# Patient Record
Sex: Female | Born: 1955 | Race: Black or African American | Hispanic: No | Smoking: Former smoker
Health system: Southern US, Community
[De-identification: ages and names within clinical notes are randomized; demographics above are authoritative.]

## PROBLEM LIST (undated history)

## (undated) DIAGNOSIS — I10 Essential (primary) hypertension: Secondary | ICD-10-CM

## (undated) DIAGNOSIS — Z809 Family history of malignant neoplasm, unspecified: Secondary | ICD-10-CM

## (undated) DIAGNOSIS — Z923 Personal history of irradiation: Secondary | ICD-10-CM

## (undated) DIAGNOSIS — Z853 Personal history of malignant neoplasm of breast: Secondary | ICD-10-CM

## (undated) DIAGNOSIS — C50919 Malignant neoplasm of unspecified site of unspecified female breast: Secondary | ICD-10-CM

## (undated) DIAGNOSIS — K219 Gastro-esophageal reflux disease without esophagitis: Secondary | ICD-10-CM

## (undated) HISTORY — DX: Family history of malignant neoplasm, unspecified: Z80.9

## (undated) HISTORY — PX: ABDOMINAL HYSTERECTOMY: SHX81

## (undated) HISTORY — DX: Personal history of malignant neoplasm of breast: Z85.3

## (undated) HISTORY — DX: Essential (primary) hypertension: I10

---

## 1999-04-18 DIAGNOSIS — C50919 Malignant neoplasm of unspecified site of unspecified female breast: Secondary | ICD-10-CM

## 1999-04-18 DIAGNOSIS — Z923 Personal history of irradiation: Secondary | ICD-10-CM

## 1999-04-18 HISTORY — DX: Personal history of irradiation: Z92.3

## 1999-04-18 HISTORY — PX: BREAST BIOPSY: SHX20

## 1999-04-18 HISTORY — PX: BREAST LUMPECTOMY: SHX2

## 1999-04-18 HISTORY — DX: Malignant neoplasm of unspecified site of unspecified female breast: C50.919

## 2004-05-31 ENCOUNTER — Ambulatory Visit: Payer: Self-pay | Admitting: General Surgery

## 2004-12-02 ENCOUNTER — Ambulatory Visit: Payer: Self-pay | Admitting: General Surgery

## 2007-01-16 ENCOUNTER — Ambulatory Visit: Payer: Self-pay | Admitting: General Surgery

## 2008-03-03 ENCOUNTER — Ambulatory Visit: Payer: Self-pay

## 2009-08-10 ENCOUNTER — Ambulatory Visit: Payer: Self-pay

## 2010-09-06 ENCOUNTER — Ambulatory Visit: Payer: Self-pay | Admitting: Family Medicine

## 2010-09-07 ENCOUNTER — Ambulatory Visit: Payer: Self-pay | Admitting: Family Medicine

## 2011-09-07 ENCOUNTER — Ambulatory Visit: Payer: Self-pay | Admitting: Family Medicine

## 2012-09-10 ENCOUNTER — Ambulatory Visit: Payer: Self-pay | Admitting: Family Medicine

## 2013-02-07 ENCOUNTER — Ambulatory Visit: Payer: Self-pay | Admitting: Gastroenterology

## 2013-11-04 ENCOUNTER — Ambulatory Visit: Payer: Self-pay | Admitting: Family Medicine

## 2014-09-30 ENCOUNTER — Other Ambulatory Visit: Payer: Self-pay | Admitting: Family Medicine

## 2014-09-30 DIAGNOSIS — Z1231 Encounter for screening mammogram for malignant neoplasm of breast: Secondary | ICD-10-CM

## 2015-02-02 ENCOUNTER — Ambulatory Visit
Admission: RE | Admit: 2015-02-02 | Discharge: 2015-02-02 | Disposition: A | Discharge status: Home / Self Care | Payer: 59 | Source: Ambulatory Visit | Attending: Family Medicine | Admitting: Family Medicine

## 2015-02-02 DIAGNOSIS — Z1231 Encounter for screening mammogram for malignant neoplasm of breast: Secondary | ICD-10-CM

## 2015-02-02 HISTORY — DX: Malignant neoplasm of unspecified site of unspecified female breast: C50.919

## 2016-01-14 ENCOUNTER — Other Ambulatory Visit: Payer: Self-pay | Admitting: Family Medicine

## 2016-01-14 DIAGNOSIS — Z1231 Encounter for screening mammogram for malignant neoplasm of breast: Secondary | ICD-10-CM

## 2016-02-11 ENCOUNTER — Ambulatory Visit
Admission: RE | Admit: 2016-02-11 | Discharge: 2016-02-11 | Disposition: A | Discharge status: Home / Self Care | Payer: 59 | Source: Ambulatory Visit | Attending: Family Medicine | Admitting: Family Medicine

## 2016-02-11 DIAGNOSIS — Z1231 Encounter for screening mammogram for malignant neoplasm of breast: Secondary | ICD-10-CM | POA: Insufficient documentation

## 2016-02-11 HISTORY — DX: Personal history of irradiation: Z92.3

## 2017-01-16 ENCOUNTER — Other Ambulatory Visit: Payer: Self-pay | Admitting: Physician Assistant

## 2017-01-16 DIAGNOSIS — Z1231 Encounter for screening mammogram for malignant neoplasm of breast: Secondary | ICD-10-CM

## 2017-02-16 ENCOUNTER — Ambulatory Visit
Admission: RE | Admit: 2017-02-16 | Discharge: 2017-02-16 | Disposition: A | Discharge status: Home / Self Care | Payer: 59 | Source: Ambulatory Visit | Attending: Physician Assistant | Admitting: Physician Assistant

## 2017-02-16 DIAGNOSIS — Z1231 Encounter for screening mammogram for malignant neoplasm of breast: Secondary | ICD-10-CM | POA: Diagnosis present

## 2018-02-01 ENCOUNTER — Other Ambulatory Visit: Payer: Self-pay | Admitting: Physician Assistant

## 2018-02-01 DIAGNOSIS — Z1231 Encounter for screening mammogram for malignant neoplasm of breast: Secondary | ICD-10-CM

## 2018-03-01 ENCOUNTER — Ambulatory Visit
Admission: RE | Admit: 2018-03-01 | Discharge: 2018-03-01 | Disposition: A | Discharge status: Home / Self Care | Payer: BLUE CROSS/BLUE SHIELD | Source: Ambulatory Visit | Attending: Physician Assistant | Admitting: Physician Assistant

## 2018-03-01 DIAGNOSIS — Z1231 Encounter for screening mammogram for malignant neoplasm of breast: Secondary | ICD-10-CM | POA: Insufficient documentation

## 2019-02-19 ENCOUNTER — Other Ambulatory Visit: Payer: Self-pay | Admitting: Physician Assistant

## 2019-02-19 DIAGNOSIS — Z1231 Encounter for screening mammogram for malignant neoplasm of breast: Secondary | ICD-10-CM

## 2019-03-07 ENCOUNTER — Ambulatory Visit
Admission: RE | Admit: 2019-03-07 | Discharge: 2019-03-07 | Disposition: A | Discharge status: Home / Self Care | Payer: BC Managed Care – PPO | Source: Ambulatory Visit | Attending: Physician Assistant | Admitting: Physician Assistant

## 2019-03-07 DIAGNOSIS — Z1231 Encounter for screening mammogram for malignant neoplasm of breast: Secondary | ICD-10-CM | POA: Insufficient documentation

## 2019-03-26 ENCOUNTER — Other Ambulatory Visit: Payer: Self-pay

## 2019-03-26 DIAGNOSIS — N6320 Unspecified lump in the left breast, unspecified quadrant: Secondary | ICD-10-CM

## 2019-03-26 DIAGNOSIS — R928 Other abnormal and inconclusive findings on diagnostic imaging of breast: Secondary | ICD-10-CM

## 2019-04-04 ENCOUNTER — Ambulatory Visit
Admission: RE | Admit: 2019-04-04 | Discharge: 2019-04-04 | Disposition: A | Discharge status: Home / Self Care | Payer: BC Managed Care – PPO | Source: Ambulatory Visit | Attending: Family Medicine | Admitting: Family Medicine

## 2019-04-04 DIAGNOSIS — N6320 Unspecified lump in the left breast, unspecified quadrant: Secondary | ICD-10-CM | POA: Diagnosis present

## 2019-04-04 DIAGNOSIS — R928 Other abnormal and inconclusive findings on diagnostic imaging of breast: Secondary | ICD-10-CM | POA: Insufficient documentation

## 2019-04-18 HISTORY — PX: BREAST BIOPSY: SHX20

## 2019-04-28 ENCOUNTER — Other Ambulatory Visit: Payer: Self-pay

## 2019-04-28 DIAGNOSIS — N6320 Unspecified lump in the left breast, unspecified quadrant: Secondary | ICD-10-CM

## 2019-04-28 DIAGNOSIS — R928 Other abnormal and inconclusive findings on diagnostic imaging of breast: Secondary | ICD-10-CM

## 2019-05-05 ENCOUNTER — Ambulatory Visit
Admission: RE | Admit: 2019-05-05 | Discharge: 2019-05-05 | Disposition: A | Discharge status: Home / Self Care | Payer: BC Managed Care – PPO | Source: Ambulatory Visit | Attending: Family Medicine | Admitting: Family Medicine

## 2019-05-05 DIAGNOSIS — N6320 Unspecified lump in the left breast, unspecified quadrant: Secondary | ICD-10-CM | POA: Insufficient documentation

## 2019-05-05 DIAGNOSIS — R928 Other abnormal and inconclusive findings on diagnostic imaging of breast: Secondary | ICD-10-CM | POA: Diagnosis not present

## 2019-05-07 ENCOUNTER — Encounter: Payer: Self-pay | Admitting: *Deleted

## 2019-05-07 DIAGNOSIS — C50912 Malignant neoplasm of unspecified site of left female breast: Secondary | ICD-10-CM

## 2019-05-07 NOTE — Progress Notes (Signed)
Talked to patient today to establish navigation services.  Patient has a history of left breast cancer in 2001.  States she was treated with lumpectomy and radiation therapy only.  I have scheduled her a surgical consult with Dr. Dahlia Byes on Monday 05/12/19 at 10:00 and Dr. Janese Banks at 1:30 to make it more convenient since she lives about an hour away.  Will her educational literature at her appointment with Dr. Janese Banks.

## 2019-05-08 ENCOUNTER — Ambulatory Visit: Payer: Self-pay | Admitting: Surgery

## 2019-05-09 LAB — SURGICAL PATHOLOGY

## 2019-05-12 ENCOUNTER — Other Ambulatory Visit: Payer: Self-pay

## 2019-05-12 ENCOUNTER — Inpatient Hospital Stay: Payer: BC Managed Care – PPO | Attending: Oncology | Admitting: Oncology

## 2019-05-12 ENCOUNTER — Encounter: Payer: Self-pay | Admitting: Oncology

## 2019-05-12 ENCOUNTER — Encounter: Payer: Self-pay | Admitting: *Deleted

## 2019-05-12 ENCOUNTER — Ambulatory Visit: Payer: BC Managed Care – PPO | Admitting: Surgery

## 2019-05-12 ENCOUNTER — Encounter: Payer: Self-pay | Admitting: Surgery

## 2019-05-12 VITALS — BP 153/85 | HR 71 | Temp 97.9°F | Resp 12 | Ht 65.0 in | Wt 183.8 lb

## 2019-05-12 VITALS — BP 161/84 | HR 67 | Temp 97.2°F | Ht 60.0 in | Wt 184.0 lb

## 2019-05-12 DIAGNOSIS — I10 Essential (primary) hypertension: Secondary | ICD-10-CM | POA: Insufficient documentation

## 2019-05-12 DIAGNOSIS — Z7189 Other specified counseling: Secondary | ICD-10-CM | POA: Diagnosis not present

## 2019-05-12 DIAGNOSIS — Z87891 Personal history of nicotine dependence: Secondary | ICD-10-CM | POA: Insufficient documentation

## 2019-05-12 DIAGNOSIS — Z923 Personal history of irradiation: Secondary | ICD-10-CM | POA: Insufficient documentation

## 2019-05-12 DIAGNOSIS — Z171 Estrogen receptor negative status [ER-]: Secondary | ICD-10-CM

## 2019-05-12 DIAGNOSIS — Z853 Personal history of malignant neoplasm of breast: Secondary | ICD-10-CM | POA: Insufficient documentation

## 2019-05-12 DIAGNOSIS — C50312 Malignant neoplasm of lower-inner quadrant of left female breast: Secondary | ICD-10-CM

## 2019-05-12 DIAGNOSIS — Z79899 Other long term (current) drug therapy: Secondary | ICD-10-CM | POA: Insufficient documentation

## 2019-05-12 DIAGNOSIS — Z170 Estrogen receptor positive status [ER+]: Secondary | ICD-10-CM | POA: Diagnosis not present

## 2019-05-12 NOTE — Progress Notes (Signed)
Met patient and her sister today during her initial medical oncology consult.  Patient with history of left breast cancer 2001.  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  Dr. Janese Banks has recommended neoadjuvant chemotherapy with [facility] and Taxol.  She is planning MRI, [place], chemo class and port a cath placement.  Gave patient breast cancer educational literature, "My Breast Cancer Treatment Handbook" by Josephine Igo, RN.  Patient is to call with any questions or needs.

## 2019-05-12 NOTE — Progress Notes (Signed)
Surgical Consultation  05/12/2019  Molly Walters is an 64 y.o. female.   Chief Complaint  Patient presents with  . New Patient (Initial Visit)    invasive mammory left breast      HPI: Molly Walters is a 64 year old female seen after a recent mammogram show an abnormal mass on her left breast.  She consequently underwent ultrasound-guided biopsy.  Please note that I have personally reviewed the ultrasound on the mammographic images showing evidence of 1/2 cm mass 7:00.  Pathology consistent with invasive mammary carcinoma, ER/PR and HER-2 are pending at this time.  Findings discussed with patient in detail.  Additionally she did have a history of breast cancer in 2001 requiring lumpectomy and radiation therapy.  After she was told about the mass now she is able to feel a lump on her left breast.  She denies any nipple discharge.  She only experience a mild discomfort after biopsy.  No chills no fevers no lymphadenopathy.  No weight loss no B type symptoms.  Past Medical History:  Diagnosis Date  . Breast cancer ([place]) 2001   left breast lumpectomy and rad tx  . Hypertension   . Personal history of radiation therapy 2001   BREAST CA    Past Surgical History:  Procedure Laterality Date  . ABDOMINAL HYSTERECTOMY    . BREAST BIOPSY Left 2001   POS  . BREAST LUMPECTOMY Left 2001    Family History  Problem Relation Age of Onset  . Hypertension Mother   . Breast cancer Neg Hx     Social History:  reports that she has never smoked. She has never used smokeless tobacco. She reports that she does not drink alcohol or use drugs.  Allergies: Not on File  Medications reviewed.     ROS Full ROS performed and is otherwise negative other than what is stated in the HPI    BP (!) 153/85   Pulse 71   Temp 97.9 F (36.6 C)   Resp 12   Ht '5\' 5"'  (1.651 m)   Wt 183 lb 12.8 oz (83.4 kg)   SpO2 97%   BMI 30.59 kg/m   Physical Exam Vitals and nursing note reviewed. Exam conducted with  a chaperone present.  Constitutional:      General: She is not in acute distress.    Appearance: Normal appearance. She is normal weight. She is not ill-appearing.  Eyes:     General: No scleral icterus.       Right eye: No discharge.        Left eye: No discharge.  Cardiovascular:     Rate and Rhythm: Normal rate and regular rhythm.     Heart sounds: No murmur.  Pulmonary:     Effort: Pulmonary effort is normal. No respiratory distress.     Breath sounds: Normal breath sounds. No stridor. No wheezing or rhonchi.     Comments: BREAST: There is evidence of a mass approximately 2 cm in size located at 7:00 on the left breast.  Please note that is a difficult to clearly distinguish between the previous biopsy site along with the radiation changes and the new mass.  In total there is the area on the left breast that encompasses a good 5 x 4 cm to include the area of previous lumpectomy.  Please note that the new mass is adjacent and inferior to the prior lumpectomy site.  There is no evidence of any palpable lesions on the right side and there is  no evidence of lymphadenopathy Abdominal:     General: Abdomen is flat. There is no distension.     Palpations: There is no mass.     Tenderness: There is no abdominal tenderness. There is no rebound.     Hernia: No hernia is present.  Musculoskeletal:     Cervical back: Normal range of motion and neck supple. No rigidity or tenderness.  Lymphadenopathy:     Cervical: No cervical adenopathy.  Skin:    General: Skin is warm and dry.     Capillary Refill: Capillary refill takes less than 2 seconds.  Neurological:     General: No focal deficit present.     Mental Status: She is alert and oriented to person, place, and time.  Psychiatric:        Mood and Affect: Mood normal.        Behavior: Behavior normal.        Thought Content: Thought content normal.        Judgment: Judgment normal.      Assessment/Plan: 64 year old female with new left  breast mammary carcinoma that is invasive pending markers at this time.  With a prior history of breast cancer on the same side requiring lumpectomy and radiation therapy.  Given the prior lumpectomy site and radiation changes were made is a very difficult to clearly distinguish the boundaries of the new mass.  In total it encompasses a 4 x 5 cm area on her left breast. I had a lengthy discussion with patient regarding my findings.  Given that she has had radiation in the past and would not recommend mastectomy from our surgical perspective and a sentinel lymph node biopsy.  Even if technically feasible a lumpectomy will have to take at least more than 60% of her breast giving a very severe and significant deformity to the left side.  I have had an extensive discussion with the patient regarding my thought process and my physical findings.  She does have an appointment with Dr. Janese Banks from oncology to discuss any further potential chemotherapy.  I will see her back next Monday to discuss again about surgical options.  I also discussed with her the option of immediate breast reconstruction.  She seems to be interested in th this process. Please note that I spent > 60 minutes  In this encounter with greater than 50% spent in coordination counseling of her care   Caroleen Hamman, MD [place] [facility]

## 2019-05-12 NOTE — H&P (View-Only) (Signed)
Surgical Consultation  05/12/2019  Molly Walters is an 64 y.o. female.   Chief Complaint  Patient presents with  . New Patient (Initial Visit)    invasive mammory left breast      HPI: Molly Walters is a 64 year old female seen after a recent mammogram show an abnormal mass on her left breast.  She consequently underwent ultrasound-guided biopsy.  Please note that I have personally reviewed the ultrasound on the mammographic images showing evidence of 1/2 cm mass 7:00.  Pathology consistent with invasive mammary carcinoma, ER/PR and HER-2 are pending at this time.  Findings discussed with patient in detail.  Additionally she did have a history of breast cancer in 2001 requiring lumpectomy and radiation therapy.  After she was told about the mass now she is able to feel a lump on her left breast.  She denies any nipple discharge.  She only experience a mild discomfort after biopsy.  No chills no fevers no lymphadenopathy.  No weight loss no B type symptoms.  Past Medical History:  Diagnosis Date  . Breast cancer ([place]) 2001   left breast lumpectomy and rad tx  . Hypertension   . Personal history of radiation therapy 2001   BREAST CA    Past Surgical History:  Procedure Laterality Date  . ABDOMINAL HYSTERECTOMY    . BREAST BIOPSY Left 2001   POS  . BREAST LUMPECTOMY Left 2001    Family History  Problem Relation Age of Onset  . Hypertension Mother   . Breast cancer Neg Hx     Social History:  reports that she has never smoked. She has never used smokeless tobacco. She reports that she does not drink alcohol or use drugs.  Allergies: Not on File  Medications reviewed.     ROS Full ROS performed and is otherwise negative other than what is stated in the HPI    BP (!) 153/85   Pulse 71   Temp 97.9 F (36.6 C)   Resp 12   Ht '5\' 5"'  (1.651 m)   Wt 183 lb 12.8 oz (83.4 kg)   SpO2 97%   BMI 30.59 kg/m   Physical Exam Vitals and nursing note reviewed. Exam conducted with  a chaperone present.  Constitutional:      General: She is not in acute distress.    Appearance: Normal appearance. She is normal weight. She is not ill-appearing.  Eyes:     General: No scleral icterus.       Right eye: No discharge.        Left eye: No discharge.  Cardiovascular:     Rate and Rhythm: Normal rate and regular rhythm.     Heart sounds: No murmur.  Pulmonary:     Effort: Pulmonary effort is normal. No respiratory distress.     Breath sounds: Normal breath sounds. No stridor. No wheezing or rhonchi.     Comments: BREAST: There is evidence of a mass approximately 2 cm in size located at 7:00 on the left breast.  Please note that is a difficult to clearly distinguish between the previous biopsy site along with the radiation changes and the new mass.  In total there is the area on the left breast that encompasses a good 5 x 4 cm to include the area of previous lumpectomy.  Please note that the new mass is adjacent and inferior to the prior lumpectomy site.  There is no evidence of any palpable lesions on the right side and there is  no evidence of lymphadenopathy Abdominal:     General: Abdomen is flat. There is no distension.     Palpations: There is no mass.     Tenderness: There is no abdominal tenderness. There is no rebound.     Hernia: No hernia is present.  Musculoskeletal:     Cervical back: Normal range of motion and neck supple. No rigidity or tenderness.  Lymphadenopathy:     Cervical: No cervical adenopathy.  Skin:    General: Skin is warm and dry.     Capillary Refill: Capillary refill takes less than 2 seconds.  Neurological:     General: No focal deficit present.     Mental Status: She is alert and oriented to person, place, and time.  Psychiatric:        Mood and Affect: Mood normal.        Behavior: Behavior normal.        Thought Content: Thought content normal.        Judgment: Judgment normal.      Assessment/Plan: 64 year old female with new left  breast mammary carcinoma that is invasive pending markers at this time.  With a prior history of breast cancer on the same side requiring lumpectomy and radiation therapy.  Given the prior lumpectomy site and radiation changes were made is a very difficult to clearly distinguish the boundaries of the new mass.  In total it encompasses a 4 x 5 cm area on her left breast. I had a lengthy discussion with patient regarding my findings.  Given that she has had radiation in the past and would not recommend mastectomy from our surgical perspective and a sentinel lymph node biopsy.  Even if technically feasible a lumpectomy will have to take at least more than 60% of her breast giving a very severe and significant deformity to the left side.  I have had an extensive discussion with the patient regarding my thought process and my physical findings.  She does have an appointment with Dr. Janese Banks from oncology to discuss any further potential chemotherapy.  I will see her back next Monday to discuss again about surgical options.  I also discussed with her the option of immediate breast reconstruction.  She seems to be interested in th this process. Please note that I spent > 60 minutes  In this encounter with greater than 50% spent in coordination counseling of her care   Caroleen Hamman, MD [place] [facility]

## 2019-05-12 NOTE — Patient Instructions (Addendum)
Please see your follow up appointments below. Please give our office a call if you have any questions or concerns.  Skin Sparing Mastectomy  A mastectomy is a surgery to remove a breast. Skin sparing mastectomy (SSM) is a technique that can be used during a mastectomy. The technique allows the breast to be reconstructed during the surgery. Most women are candidates for this technique. Tell a health care provider about:  Any allergies you have.  Whether you are pregnant or may be pregnant.  All medicines you are taking, including vitamins, herbs, eye drops, creams, and over-the-counter medicines.  Any problems you or family members have had with anesthetic medicines.  Any blood disorders you have.  Any surgeries you have had.  Any medical conditions you have. What are the risks? Generally, this is a safe procedure. However, problems may occur, including:  Infection.  Bleeding.  Death of tissue (necrosis). People who use tobacco are at increased risk for this problem.  The formation of a pocket of clear fluid (seroma).  A skin infection caused by bacteria (cellulitis).  Nerve damage due to poor positioning of the arm during surgery.  A blood clot (hematoma). This is rare.  A collapsed lung (pneumothorax). This is rare. What happens before the procedure?  You may be checked for extra fluid around your lymph nodes (lymphedema) in the area around the affected breast. Hydration Follow instructions from your health care provider about hydration, which may include:  Up to 2 hours before the procedure - you may continue to drink clear liquids, such as water, clear fruit juice, black coffee, and plain tea. Eating and drinking Follow instructions from your health care provider about eating and drinking, which may include:  8 hours before the procedure - stop eating heavy meals or foods such as meat, fried foods, or fatty foods.  6 hours before the procedure - stop eating light  meals or foods, such as toast or cereal.  6 hours before the procedure - stop drinking milk or drinks that contain milk.  2 hours before the procedure - stop drinking clear liquids. Medicine Ask your health care provider about:  Changing or stopping your regular medicines. This is especially important if you are taking diabetes medicines or blood thinners.  Taking medicines such as aspirin and ibuprofen. These medicines can thin your blood. Do not take these medicines before your procedure if your health care provider instructs you not to.  You may be given antibiotic medicine to help prevent infection. General instructions  Do not use any products that contain nicotine or tobacco, such as cigarettes and e-cigarettes. If you need help quitting, ask your health care provider.  Plan to have someone take you home from the hospital or clinic.  If you will be going home right after the procedure, plan to have someone with you for 24 hours. What happens during the procedure?  To lower your risk of infection: ? Your health care team will wash or sanitize their hands. ? Your skin will be washed with soap.  An IV tube will be inserted into one of your veins.  You will be given one or more of the following: ? A medicine to help you relax (sedative). ? A medicine to numb the area (local anesthetic). ? A medicine to make you fall asleep (general anesthetic). ? A medicine that is injected into your spine to numb the area below and slightly above the injection site (spinal anesthetic). ? A medicine that is injected into  an area of your body to numb everything below the injection site (regional anesthetic).  An incision will be made, and breast tissue and the nipple will be removed.  Tissue from the area may be sent to be examined under a microscope to look for cancer cells (biopsy).  Lymph nodes in the area may be checked for cancer and removed if necessary.  Skin around the breast will be  preserved to form a pocket with flaps that will be developed to insert a breast implant.  An implant may be inserted into the pocket made by the flaps.  A tube will be placed to drain blood and fluids that collect in the surgical area.  The incision will be closed with sutures (stitches), skin glue, or skin adhesive strips.  A gauze bandage (dressing) will be put over the incision. The procedure may vary among health care providers and hospitals. What happens after the procedure?  Your blood pressure, heart rate, breathing rate, and blood oxygen level will be monitored until the medicines you were given have worn off.  You will be given pain medicine as needed.  You will likely have to stay at the hospital overnight so that your health care providers can make sure: ? You are able to tolerate pain-relieving medicines and other medicines. ? You are able to eat. ? You are hydrated.  You will be told how to care for your drain and incisions at home.  Do not drive until your health care provider approves. Summary  Skin sparing mastectomy [facility]) is a technique that allows the breast to be reconstructed during a surgery that is done to remove a breast (mastectomy).  For the procedure, you will be given a medicine to make you fall asleep (general anesthetic).  You may need to stay in the hospital overnight after the procedure. This information is not intended to replace advice given to you by your health care provider. Make sure you discuss any questions you have with your health care provider. Document Revised: 03/08/2016 Document Reviewed: 03/08/2016 Elsevier Patient Education  2020 [employer].

## 2019-05-12 NOTE — Progress Notes (Signed)
Patient is here today to establish care for her left breast cancer. Patient was due for a mammogram and that's how they found a mass measuring 1.6 cm.

## 2019-05-13 ENCOUNTER — Encounter: Payer: Self-pay | Admitting: Oncology

## 2019-05-13 ENCOUNTER — Other Ambulatory Visit
Admission: RE | Admit: 2019-05-13 | Discharge: 2019-05-13 | Disposition: A | Discharge status: Home / Self Care | Payer: BC Managed Care – PPO | Source: Ambulatory Visit | Attending: Surgery | Admitting: Surgery

## 2019-05-13 ENCOUNTER — Telehealth: Payer: Self-pay | Admitting: Surgery

## 2019-05-13 DIAGNOSIS — C50312 Malignant neoplasm of lower-inner quadrant of left female breast: Secondary | ICD-10-CM | POA: Insufficient documentation

## 2019-05-13 DIAGNOSIS — Z20822 Contact with and (suspected) exposure to covid-19: Secondary | ICD-10-CM | POA: Diagnosis not present

## 2019-05-13 DIAGNOSIS — Z7189 Other specified counseling: Secondary | ICD-10-CM | POA: Insufficient documentation

## 2019-05-13 DIAGNOSIS — Z01812 Encounter for preprocedural laboratory examination: Secondary | ICD-10-CM | POA: Diagnosis present

## 2019-05-13 DIAGNOSIS — Z171 Estrogen receptor negative status [ER-]: Secondary | ICD-10-CM | POA: Insufficient documentation

## 2019-05-13 MED ORDER — PROCHLORPERAZINE MALEATE 10 MG PO TABS: 10.0000 mg | ORAL_TABLET | Freq: Four times a day (QID) | ORAL | 1 refills | Status: AC | PRN

## 2019-05-13 MED ORDER — ONDANSETRON HCL 8 MG PO TABS: 8.0000 mg | ORAL_TABLET | Freq: Two times a day (BID) | ORAL | 1 refills | Status: AC | PRN

## 2019-05-13 MED ORDER — LORAZEPAM 0.5 MG PO TABS: 0.5000 mg | ORAL_TABLET | Freq: Four times a day (QID) | ORAL | 0 refills | Status: AC | PRN

## 2019-05-13 MED ORDER — DEXAMETHASONE 4 MG PO TABS: ORAL_TABLET | ORAL | 1 refills | Status: DC

## 2019-05-13 NOTE — Progress Notes (Signed)
START ON PATHWAY REGIMEN - Breast     A cycle is every 14 days (cycles 1-4):     Doxorubicin      Cyclophosphamide      Pegfilgrastim-xxxx    A cycle is every 21 days (cycles 5-8):     Paclitaxel      Carboplatin   **Always confirm dose/schedule in your pharmacy ordering system**  Patient Characteristics: Preoperative or Nonsurgical Candidate (Clinical Staging), Neoadjuvant Therapy followed by Surgery, Invasive Disease, Chemotherapy, HER2 Negative/Unknown/Equivocal, ER Negative/Unknown, Platinum Therapy Indicated Therapeutic Status: Preoperative or Nonsurgical Candidate (Clinical Staging) AJCC M Category: cM0 AJCC Grade: G3 Breast Surgical Plan: Neoadjuvant Therapy followed by Surgery ER Status: Negative (-) AJCC 8 Stage Grouping: IB HER2 Status: Negative (-) AJCC T Category: cT1c AJCC N Category: cN0 PR Status: Negative (-) Type of Therapy: Platinum Therapy Indicated Intent of Therapy: Curative Intent, Discussed with Patient 

## 2019-05-13 NOTE — Addendum Note (Signed)
Addended by: Caroleen Hamman F on: 05/13/2019 07:43 AM   Modules accepted: Orders, SmartSet

## 2019-05-13 NOTE — Telephone Encounter (Signed)
Pt has been advised of pre admission date/time, Covid Testing date and Surgery date.  Surgery Date: 05/16/19 Preadmission Testing Date: 2 hrs prior to sx 05/16/19 Covid Testing Date: 05/14/19 - patient advised to go to the [facility] ([place]  Patient has been made aware to call [phone], between 1-3:00pm the day before surgery, to find out what time to arrive.

## 2019-05-13 NOTE — Progress Notes (Signed)
Hematology/Oncology Consult note [facility] Telephone:([phone] [phone]  Patient Care Team: Lanny Hurst, MD as PCP - General (Internal Medicine) Rico Junker, RN as Registered Nurse Theodore Demark, RN as Registered Nurse   Name of the patient: Molly Walters  [id number]  1955/11/12    Reason for referral-new diagnosis of breast cancer   Referring physician-BCEP program  Date of visit: 05/13/19   History of presenting illness-patient is a 64 year old African-American female with past medical history significant for hypertension.  She has had prior breast cancer in 2001 s/p lumpectomy followed by adjuvant radiation treatment.  She did not require adjuvant chemotherapy.  She does not remember taking any endocrine therapy as well.More recently patient underwent a bilateral screening mammogram in November 2020 which showed a possible mass in the left breast.  This was followed by a diagnostic mammogram and ultrasound which showed a 1.6 cm mass at the 7 o'clock position in the left breast near the lumpectomy scar.  No left axillary adenopathy.  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  Patient has seen Dr. Adora Fridge and referred to [place] for further management.  Patient is independent of her ADLs and IADLs.  Family history significant for prostate cancer in her maternal uncle.  No other family history of breast, ovarian pancreatic colon cancer or melanoma.    ECOG PS- 0  Pain scale- 0   Review of systems- Review of Systems  Constitutional: Negative for chills, fever, malaise/fatigue and weight loss.  HENT: Negative for congestion, ear discharge and nosebleeds.   Eyes: Negative for blurred vision.  Respiratory: Negative for cough, hemoptysis, sputum production, shortness of breath and wheezing.   Cardiovascular: Negative for chest pain, palpitations, orthopnea and claudication.    Gastrointestinal: Negative for abdominal pain, blood in stool, constipation, diarrhea, heartburn, melena, nausea and vomiting.  Genitourinary: Negative for dysuria, flank pain, frequency, hematuria and urgency.  Musculoskeletal: Negative for back pain, joint pain and myalgias.  Skin: Negative for rash.  Neurological: Negative for dizziness, tingling, focal weakness, seizures, weakness and headaches.  Endo/Heme/Allergies: Does not bruise/bleed easily.  Psychiatric/Behavioral: Negative for depression and suicidal ideas. The patient does not have insomnia.     No Known Allergies  There are no problems to display for this patient.    Past Medical History:  Diagnosis Date  . Breast cancer ([place]) 2001   left breast lumpectomy and rad tx  . Hypertension   . Personal history of radiation therapy 2001   BREAST CA     Past Surgical History:  Procedure Laterality Date  . ABDOMINAL HYSTERECTOMY    . BREAST BIOPSY Left 2001   POS  . BREAST LUMPECTOMY Left 2001    Social History   Socioeconomic History  . Marital status: Divorced    Spouse name: Not on file  . Number of children: Not on file  . Years of education: Not on file  . Highest education level: Not on file  Occupational History  . Not on file  Tobacco Use  . Smoking status: Former Smoker    Packs/day: 0.50    Years: 5.00    Pack years: 2.50    Types: Cigarettes    Quit date: 1970    Years since quitting: 51.1  . Smokeless tobacco: Never Used  Substance and Sexual Activity  . Alcohol use: Never  . Drug use: Never  . Sexual activity: Not on file  Other Topics  Concern  . Not on file  Social History Narrative  . Not on file   Social Determinants of Health   Financial Resource Strain:   . Difficulty of Paying Living Expenses: Not on file  Food Insecurity:   . Worried About Charity fundraiser in the Last Year: Not on file  . Ran Out of Food in the Last Year: Not on file  Transportation Needs:   . Lack of  Transportation (Medical): Not on file  . Lack of Transportation (Non-Medical): Not on file  Physical Activity:   . Days of Exercise per Week: Not on file  . Minutes of Exercise per Session: Not on file  Stress:   . Feeling of Stress : Not on file  Social Connections:   . Frequency of Communication with Friends and Family: Not on file  . Frequency of Social Gatherings with Friends and Family: Not on file  . Attends Religious Services: Not on file  . Active Member of Clubs or Organizations: Not on file  . Attends Archivist Meetings: Not on file  . Marital Status: Not on file  Intimate Partner Violence:   . Fear of Current or Ex-Partner: Not on file  . Emotionally Abused: Not on file  . Physically Abused: Not on file  . Sexually Abused: Not on file     Family History  Problem Relation Age of Onset  . Hypertension Mother   . Prostate cancer Paternal Uncle   . Breast cancer Neg Hx      Current Outpatient Medications:  .  hydrochlorothiazide (HYDRODIURIL) 25 MG tablet, Take 25 mg by mouth daily., Disp: , Rfl:  .  latanoprost (XALATAN) 0.005 % ophthalmic solution, Place 1 drop into both eyes at bedtime. , Disp: , Rfl:  .  timolol (TIMOPTIC) 0.5 % ophthalmic solution, Place 1 drop into both eyes daily. , Disp: , Rfl:  .  cholecalciferol (VITAMIN D3) 25 MCG (1000 UNIT) tablet, Take 1,000 Units by mouth daily., Disp: , Rfl:  .  losartan (COZAAR) 100 MG tablet, Take 100 mg by mouth daily., Disp: , Rfl:  .  Phenylephrine HCl (SINEX REGULAR NA), Place 1 spray into the nose daily as needed (congestion)., Disp: , Rfl:    Physical exam:  Vitals:   05/12/19 1335  BP: (!) 161/84  Pulse: 67  Temp: (!) 97.2 F (36.2 C)  TempSrc: Tympanic  Weight: 184 lb (83.5 kg)  Height: 5' (1.524 m)   Physical Exam HENT:     Head: Normocephalic and atraumatic.  Eyes:     Pupils: Pupils are equal, round, and reactive to light.  Cardiovascular:     Rate and Rhythm: Normal rate and  regular rhythm.     Heart sounds: Normal heart sounds.  Pulmonary:     Effort: Pulmonary effort is normal.     Breath sounds: Normal breath sounds.  Abdominal:     General: Bowel sounds are normal.     Palpations: Abdomen is soft.  Musculoskeletal:     Cervical back: Normal range of motion.  Skin:    General: Skin is warm and dry.  Neurological:     Mental Status: She is alert and oriented to person, place, and time.   Patient is s/p left lumpectomy with a well-healed surgical scar in her left breast.  At the 7 o'clock position there is a firm palpable mass right underneath the skin roughly 2 cm in size.  There is also significant induration due to prior  radiation.  No palpable masses in the right breast.  No palpable bilateral axillary adenopathy.    No flowsheet data found. No flowsheet data found.  No images are attached to the encounter.  MM CLIP PLACEMENT LEFT  Result Date: 05/05/2019 CLINICAL DATA:  Status post ultrasound-guided core biopsy of a mass in the left breast. EXAM: DIAGNOSTIC LEFT MAMMOGRAM POST ULTRASOUND BIOPSY COMPARISON:  Previous exam(s). FINDINGS: Mammographic images were obtained following ultrasound guided biopsy of a mass in the 6:30 region of the left breast. The venous shaped biopsy marker clip is in appropriate position in the lower-inner quadrant of the left breast. IMPRESSION: Appropriate positioning of the venous shaped biopsy marking clip at the site of biopsy in the lower-inner quadrant of the left breast. Final Assessment: Post Procedure Mammograms for Marker Placement Electronically Signed   By: Lillia Mountain M.D.   On: 05/05/2019 09:15   [place] LT BREAST BX W LOC DEV 1ST LESION IMG BX SPEC US GUIDE  Addendum Date: 05/07/2019   ADDENDUM REPORT: 05/07/2019 16:22 ADDENDUM: PATHOLOGY revealed: A. BREAST, LEFT, 6:30; ULTRASOUND-GUIDED CORE BIOPSY: - INVASIVE MAMMARY CARCINOMA, NO SPECIAL TYPE. 12 mm in this sample : Grade 3. Ductal carcinoma in situ: Not  identified Lymphovascular invasion: Not identified. Pathology results are CONCORDANT with imaging findings, per Dr. Lillia Mountain. Pathology results and recommendations were discussed with patient by telephone on 05/06/2019. Patient reported biopsy site doing well with slight tenderness at the site. Post biopsy care instructions were reviewed and questions were answered. Patient was instructed to call [facility] if any concerns or questions arise related to the biopsy. Recommendation: Surgical referral. Request for surgical referral was relayed to nurse navigators at [facility] by Electa Sniff RN on 05/06/2019. Addendum by Electa Sniff RN on 05/07/2019. Electronically Signed   By: Lillia Mountain M.D.   On: 05/07/2019 16:22   Result Date: 05/07/2019 CLINICAL DATA:  Left breast mass. EXAM: ULTRASOUND GUIDED LEFT BREAST CORE NEEDLE BIOPSY COMPARISON:  Previous exam(s). FINDINGS: I met with the patient and we discussed the procedure of ultrasound-guided biopsy, including benefits and alternatives. We discussed the high likelihood of a successful procedure. We discussed the risks of the procedure, including infection, bleeding, tissue injury, clip migration, and inadequate sampling. Informed written consent was given. The usual time-out protocol was performed immediately prior to the procedure. Lesion quadrant: Lower inner quadrant Using sterile technique and 1% lidocaine and 1% lidocaine with epinephrine as local anesthetic, under direct ultrasound visualization, a 12 gauge spring-loaded device was used to perform biopsy of a mass in the 6:30 region of the left breast using a lateral to medial approach. At the conclusion of the procedure venous shaped tissue marker clip was deployed into the biopsy cavity. Follow up 2 view mammogram was performed and dictated separately. IMPRESSION: Ultrasound guided biopsy of the left breast. No apparent complications. Electronically Signed: By: Lillia Mountain M.D.  On: 05/05/2019 09:05    Assessment and plan- Patient is a 64 y.o. female with newly diagnosed clinical prognostic stage Ia invasive mammary carcinoma cT1 ccN0 cM0 ER/PR negative and HER-2/neu negative  I discussed the results of the mammogram and biopsy with the patient in detail.  Patient has a second breast cancer 20 years after her initial diagnosis.  She has a stage I T1c N0 M0 triple negative breast cancer.  As her second breast cancer is in the same breast cancer first 1 I would recommend mastectomy interval of lumpectomy given the patient cannot receive  postlumpectomy radiation this time.  Discussed that triple negative breast cancer is an aggressive biology and given that her tumor is more than 1 cm I would recommend neoadjuvant chemotherapy for her.  Patient is in good health for her age and if there is evidence of residual disease after neoadjuvant chemotherapy she would be a suitable candidate for adjuvant Xeloda.  Discussed that she would need 4 cycles of anthracycline and Cytoxan IV every 2 weeks for 4 cycles followed by 12 weekly cycles of Taxol.  Discussed risks and benefits of chemotherapy including all but not limited to nausea, vomiting, low blood counts, risk of infections and hospitalization.  Risk of cardiotoxicity associated with doxorubicin.  Risk of infusion reaction and peripheral neuropathy associated with Taxol.  Treatment will be given with a curative intent.  Patient understands and agrees to proceed as planned.  Given that her tumor is less than 2 cm in size I do not plan to add carboplatin neoadjuvantly.  We will obtain a baseline MUGA scan as well as MRI of her bilateral breasts.  She will need a port placement and chemo teach prior to starting chemotherapy.  I will tentatively see her in 12 days to start first cycle of AC chemotherapy with growth factor support.  There would be no role for endocrine therapy and triple negative breast cancer.  If final pathology did not  show any evidence of involved lymph nodes patient will likely not need adjuvant radiation treatment as well.  Cancer Staging Malignant neoplasm of lower-inner quadrant of left breast in female, estrogen receptor negative ([place]) Staging form: Breast, AJCC 8th Edition - Clinical stage from 05/12/2019: Stage IB (cT1c, cN0, cM0, G3, ER-, PR-, HER2-) - Signed by Sindy Guadeloupe, MD on 05/13/2019   Total face to face encounter time for this patient visit was 50 min.     Thank you for this kind referral and the opportunity to participate in the care of this patient   Visit Diagnosis 1. Malignant neoplasm of lower-inner quadrant of left breast in female, estrogen receptor negative ([place])   2. Goals of care, counseling/discussion     Dr. Randa Evens, MD, MPH [facility] at [facility] [phone] 05/13/2019 12:16 PM

## 2019-05-14 ENCOUNTER — Other Ambulatory Visit: Payer: Self-pay

## 2019-05-14 LAB — SARS CORONAVIRUS 2 (TAT 6-24 HRS): SARS Coronavirus 2: NEGATIVE

## 2019-05-15 ENCOUNTER — Inpatient Hospital Stay: Payer: BC Managed Care – PPO

## 2019-05-15 ENCOUNTER — Encounter
Admission: RE | Admit: 2019-05-15 | Discharge: 2019-05-15 | Disposition: A | Discharge status: Home / Self Care | Payer: BC Managed Care – PPO | Source: Ambulatory Visit | Attending: Oncology | Admitting: Oncology

## 2019-05-15 ENCOUNTER — Other Ambulatory Visit: Payer: Self-pay

## 2019-05-15 DIAGNOSIS — C50312 Malignant neoplasm of lower-inner quadrant of left female breast: Secondary | ICD-10-CM

## 2019-05-15 DIAGNOSIS — Z171 Estrogen receptor negative status [ER-]: Secondary | ICD-10-CM

## 2019-05-15 MED ORDER — TECHNETIUM TC 99M-LABELED RED BLOOD CELLS IV KIT
20.0000 | PACK | Freq: Once | INTRAVENOUS | Status: AC | PRN
  Administered 2019-05-15: 21.53 via INTRAVENOUS

## 2019-05-15 MED ORDER — CEFAZOLIN SODIUM-DEXTROSE 2-4 GM/100ML-% IV SOLN
2.0000 g | INTRAVENOUS | Status: AC
  Administered 2019-05-16: 09:00:00 2 g via INTRAVENOUS

## 2019-05-15 MED ORDER — LIDOCAINE-PRILOCAINE 2.5-2.5 % EX CREA: TOPICAL_CREAM | CUTANEOUS | 3 refills | Status: DC

## 2019-05-16 ENCOUNTER — Encounter: Payer: Self-pay | Admitting: Surgery

## 2019-05-16 ENCOUNTER — Ambulatory Visit: Payer: BC Managed Care – PPO

## 2019-05-16 ENCOUNTER — Encounter
Admission: RE | Disposition: A | Discharge status: Home / Self Care | Payer: Self-pay | Source: Home / Self Care | Attending: Surgery

## 2019-05-16 ENCOUNTER — Ambulatory Visit: Payer: BC Managed Care – PPO | Admitting: Anesthesiology

## 2019-05-16 ENCOUNTER — Ambulatory Visit
Admission: RE | Admit: 2019-05-16 | Discharge: 2019-05-16 | Disposition: A | Discharge status: Home / Self Care | Payer: BC Managed Care – PPO | Source: Home / Self Care | Attending: Surgery | Admitting: Surgery

## 2019-05-16 ENCOUNTER — Ambulatory Visit
Admission: RE | Admit: 2019-05-16 | Discharge: 2019-05-16 | Disposition: A | Discharge status: Home / Self Care | Payer: BC Managed Care – PPO | Attending: Surgery | Admitting: Surgery

## 2019-05-16 ENCOUNTER — Other Ambulatory Visit: Payer: Self-pay

## 2019-05-16 DIAGNOSIS — Z171 Estrogen receptor negative status [ER-]: Secondary | ICD-10-CM | POA: Diagnosis not present

## 2019-05-16 DIAGNOSIS — C50312 Malignant neoplasm of lower-inner quadrant of left female breast: Secondary | ICD-10-CM

## 2019-05-16 DIAGNOSIS — Z8249 Family history of ischemic heart disease and other diseases of the circulatory system: Secondary | ICD-10-CM | POA: Insufficient documentation

## 2019-05-16 DIAGNOSIS — I10 Essential (primary) hypertension: Secondary | ICD-10-CM | POA: Diagnosis not present

## 2019-05-16 HISTORY — PX: PORTACATH PLACEMENT: SHX2246

## 2019-05-16 SURGERY — INSERTION, TUNNELED CENTRAL VENOUS DEVICE, WITH PORT
Anesthesia: General | Site: Chest | Laterality: Right

## 2019-05-16 MED ORDER — FENTANYL CITRATE (PF) 100 MCG/2ML IJ SOLN
INTRAMUSCULAR | Status: DC | PRN
  Administered 2019-05-16 (×2): 25 ug via INTRAVENOUS

## 2019-05-16 MED ORDER — CHLORHEXIDINE GLUCONATE CLOTH 2 % EX PADS: 6.0000 | MEDICATED_PAD | Freq: Once | CUTANEOUS | Status: DC

## 2019-05-16 MED ORDER — ONDANSETRON HCL 4 MG/2ML IJ SOLN: 4.0000 mg | Freq: Once | INTRAMUSCULAR | Status: DC | PRN

## 2019-05-16 MED ORDER — FAMOTIDINE 20 MG PO TABS: 20.0000 mg | ORAL_TABLET | Freq: Once | ORAL | Status: AC

## 2019-05-16 MED ORDER — FENTANYL CITRATE (PF) 100 MCG/2ML IJ SOLN: 25.0000 ug | INTRAMUSCULAR | Status: DC | PRN

## 2019-05-16 MED ORDER — BUPIVACAINE HCL (PF) 0.25 % IJ SOLN
INTRAMUSCULAR | Status: AC
  Filled 2019-05-16: qty 30

## 2019-05-16 MED ORDER — EPINEPHRINE PF 1 MG/ML IJ SOLN
INTRAMUSCULAR | Status: AC
  Filled 2019-05-16: qty 1

## 2019-05-16 MED ORDER — LIDOCAINE HCL (PF) 1 % IJ SOLN
INTRAMUSCULAR | Status: AC
  Filled 2019-05-16: qty 30

## 2019-05-16 MED ORDER — HEPARIN SODIUM (PORCINE) 5000 UNIT/ML IJ SOLN
INTRAMUSCULAR | Status: AC
  Filled 2019-05-16: qty 1

## 2019-05-16 MED ORDER — SODIUM CHLORIDE 0.9 % IV SOLN
INTRAVENOUS | Status: DC | PRN
  Administered 2019-05-16: 1 mL via INTRAMUSCULAR

## 2019-05-16 MED ORDER — LIDOCAINE HCL (PF) 1 % IJ SOLN
INTRAMUSCULAR | Status: DC | PRN
  Administered 2019-05-16: 15 mL

## 2019-05-16 MED ORDER — FENTANYL CITRATE (PF) 100 MCG/2ML IJ SOLN
INTRAMUSCULAR | Status: AC
  Filled 2019-05-16: qty 2

## 2019-05-16 MED ORDER — BUPIVACAINE-EPINEPHRINE (PF) 0.25% -1:200000 IJ SOLN
INTRAMUSCULAR | Status: DC | PRN
  Administered 2019-05-16: 15 mL

## 2019-05-16 MED ORDER — LACTATED RINGERS IV SOLN
INTRAVENOUS | Status: DC
  Administered 2019-05-16: 100 mL/h via INTRAVENOUS

## 2019-05-16 MED ORDER — MIDAZOLAM HCL 2 MG/2ML IJ SOLN
INTRAMUSCULAR | Status: DC | PRN
  Administered 2019-05-16: 2 mg via INTRAVENOUS

## 2019-05-16 MED ORDER — CEFAZOLIN SODIUM-DEXTROSE 2-4 GM/100ML-% IV SOLN
INTRAVENOUS | Status: AC
  Filled 2019-05-16: qty 100

## 2019-05-16 MED ORDER — FAMOTIDINE 20 MG PO TABS
ORAL_TABLET | ORAL | Status: AC
  Administered 2019-05-16: 20 mg via ORAL
  Filled 2019-05-16: qty 1

## 2019-05-16 MED ORDER — PROPOFOL 500 MG/50ML IV EMUL
INTRAVENOUS | Status: AC
  Filled 2019-05-16: qty 50

## 2019-05-16 MED ORDER — PROPOFOL 500 MG/50ML IV EMUL
INTRAVENOUS | Status: DC | PRN
  Administered 2019-05-16: 100 ug/kg/min via INTRAVENOUS

## 2019-05-16 MED ORDER — LIDOCAINE HCL (CARDIAC) PF 100 MG/5ML IV SOSY
PREFILLED_SYRINGE | INTRAVENOUS | Status: DC | PRN
  Administered 2019-05-16: 40 mg via INTRAVENOUS

## 2019-05-16 MED ORDER — MIDAZOLAM HCL 2 MG/2ML IJ SOLN
INTRAMUSCULAR | Status: AC
  Filled 2019-05-16: qty 2

## 2019-05-16 SURGICAL SUPPLY — 37 items
BAG DECANTER FOR FLEXI CONT (MISCELLANEOUS) ×3 IMPLANT
BLADE SURG SZ11 CARB STEEL (BLADE) ×3 IMPLANT
BOOT SUTURE AID YELLOW STND (SUTURE) ×3 IMPLANT
CANISTER SUCT 1200ML W/VALVE (MISCELLANEOUS) ×3 IMPLANT
CHLORAPREP W/TINT 26 (MISCELLANEOUS) ×3 IMPLANT
COVER LIGHT HANDLE STERIS (MISCELLANEOUS) ×6 IMPLANT
COVER PROBE FLX POLY STRL (MISCELLANEOUS) ×3 IMPLANT
COVER WAND RF STERILE (DRAPES) ×3 IMPLANT
DERMABOND ADVANCED (GAUZE/BANDAGES/DRESSINGS) ×2
DERMABOND ADVANCED .7 DNX12 (GAUZE/BANDAGES/DRESSINGS) ×1 IMPLANT
DRAPE 3/4 80X56 (DRAPES) ×3 IMPLANT
DRAPE C-ARM XRAY 36X54 (DRAPES) ×6 IMPLANT
DRAPE INCISE IOBAN 66X45 STRL (DRAPES) ×3 IMPLANT
ELECT CAUTERY BLADE 6.4 (BLADE) ×3 IMPLANT
ELECT REM PT RETURN 9FT ADLT (ELECTROSURGICAL) ×3
ELECTRODE REM PT RTRN 9FT ADLT (ELECTROSURGICAL) ×1 IMPLANT
GEL ULTRASOUND 20GR AQUASONIC (MISCELLANEOUS) ×3 IMPLANT
GLOVE BIO SURGEON STRL SZ7 (GLOVE) ×3 IMPLANT
GOWN STRL REUS W/ TWL LRG LVL3 (GOWN DISPOSABLE) ×2 IMPLANT
GOWN STRL REUS W/TWL LRG LVL3 (GOWN DISPOSABLE) ×4
IV NS 500ML (IV SOLUTION) ×2
IV NS 500ML BAXH (IV SOLUTION) ×1 IMPLANT
KIT PORT POWER 8FR ISP CVUE (Port) ×3 IMPLANT
NEEDLE HYPO 22GX1.5 SAFETY (NEEDLE) ×3 IMPLANT
NS IRRIG 1000ML POUR BTL (IV SOLUTION) ×3 IMPLANT
PACK PORT-A-CATH (MISCELLANEOUS) ×3 IMPLANT
SPONGE LAP 18X18 RF (DISPOSABLE) ×3 IMPLANT
SUT MNCRL AB 4-0 PS2 18 (SUTURE) ×3 IMPLANT
SUT PROLENE 2-0 (SUTURE) ×2
SUT PROLENE 2-0 RB1 36X2 ARM (SUTURE) ×1
SUT VIC AB 3-0 SH 27 (SUTURE) ×2
SUT VIC AB 3-0 SH 27X BRD (SUTURE) ×1 IMPLANT
SUTURE PROLEN 2-0 RB1 36X2 ARM (SUTURE) ×1 IMPLANT
SYR 10ML LL (SYRINGE) ×3 IMPLANT
SYR 20ML LL LF (SYRINGE) ×3 IMPLANT
SYR 5ML LL (SYRINGE) ×3 IMPLANT
TOWEL OR 17X26 4PK STRL BLUE (TOWEL DISPOSABLE) ×3 IMPLANT

## 2019-05-16 NOTE — Op Note (Signed)
  Pre-operative Diagnosis: Left breast CA  Post-operative Diagnosis: same   Surgeon: Caroleen Hamman, MD FACS  Anesthesia: IV sedation, marcaine .25% w epi and lidocaine 1%  Procedure: right IJ  Port placement with fluoroscopy under U/S guidance  Findings: Good position of the tip of the catheter by fluoroscopy  Estimated Blood Loss: Minimal         Drains: None         Specimens: None       Complications: none            Procedure Details  The patient was seen again in the Holding Room. The benefits, complications, treatment options, and expected outcomes were discussed with the patient. The risks of bleeding, infection, recurrence of symptoms, failure to resolve symptoms,  thrombosis nonfunction breakage pneumothorax hemopneumothorax any of which could require chest tube or further surgery were reviewed with the patient.   The patient was taken to Operating Room, identified as Molly Walters and the procedure verified.  A Time Out was held and the above information confirmed.  Prior to the induction of general anesthesia, antibiotic prophylaxis was administered. VTE prophylaxis was in place. Appropriate anesthesia was then administered and tolerated well. The chest was prepped with Chloraprep and draped in the sterile fashion. The patient was positioned in the supine position. Then the patient was placed in Trendelenburg position.  Patient was prepped and draped in sterile fashion and in a Trendelenburg position local anesthetic was infiltrated into the skin and subcutaneous tissues in the neck and anterior chest wall. The large bore needle was placed into the internal jugular vein under U/S guidance without difficulty and then the Seldinger wire was advanced. Fluoroscopy was utilized to confirm that the Seldinger wire was in the superior vena cava.  An incision was made and a port pocket developed with blunt and electrocautery dissection. The introducer dilator was placed over the  Seldinger wire the wire was removed. The previously flushed catheter was placed into the introducer dilator and the peel-away sheath was removed. The catheter length was confirmed and trimmed utilizing fluoroscopy for proper positioning. The catheter was then attached to the previously flushed port. The port was placed into the pocket. The port was held in with 2-0 Prolenes and flushed for function and heparin locked.  The wound was closed with interrupted 3-0 Vicryl followed by 4-0 subcuticular Monocryl sutures. Dermabond used to coat the skin  Patient was taken to the recovery room in stable condition where a postoperative chest film has been ordered.

## 2019-05-16 NOTE — Interval H&P Note (Signed)
History and Physical Interval Note:  05/16/2019 8:58 AM  Molly Walters  has presented today for surgery, with the diagnosis of Malignant neoplasm of lower-inner quadrant L breast.  The various methods of treatment have been discussed with the patient and family. After consideration of risks, benefits and other options for treatment, the patient has consented to  Procedure(s): INSERTION PORT-A-CATH (N/A) as a surgical intervention.  The patient's history has been reviewed, patient examined, no change in status, stable for surgery.  I have reviewed the patient's chart and labs.  Questions were answered to the patient's satisfaction.   Pt w triple neg breast CA in need for port. Procedure d/w the pt , risks, benefits and possible complications.   [place]

## 2019-05-16 NOTE — OR Nursing (Signed)
Discussed follow up with Dr. Dahlia Byes, states on an as needed basis only.   Appt. In epic for Feb 1st cancelled with office by Phillips Grout, RN.

## 2019-05-16 NOTE — Discharge Instructions (Addendum)

## 2019-05-16 NOTE — Transfer of Care (Signed)
Immediate Anesthesia Transfer of Care Note  Patient: Molly Walters  Procedure(s) Performed: INSERTION PORT-A-CATH (Right Chest)  Patient Location: PACU  Anesthesia Type:General  Level of Consciousness: awake, alert  and oriented  Airway & Oxygen Therapy: Patient Spontanous Breathing and Patient connected to face mask oxygen  Post-op Assessment: Report given to RN and Post -op Vital signs reviewed and stable  Post vital signs: Reviewed and stable  Last Vitals:  Vitals Value Taken Time  BP 97/52 05/16/19 1047  Temp    Pulse 58 05/16/19 1047  Resp 17 05/16/19 1047  SpO2 99 % 05/16/19 1047  Vitals shown include unvalidated device data.  Last Pain:  Vitals:   05/16/19 0825  TempSrc: Tympanic  PainSc: 0-No pain         Complications: No apparent anesthesia complications

## 2019-05-16 NOTE — Interval H&P Note (Signed)
History and Physical Interval Note:  05/16/2019 8:53 AM  Molly Walters  has presented today for surgery, with the diagnosis of Malignant neoplasm of lower-inner quadrant L breast.  The various methods of treatment have been discussed with the patient and family. After consideration of risks, benefits and other options for treatment, the patient has consented to  Procedure(s): INSERTION PORT-A-CATH (N/A) as a surgical intervention.  The patient's history has been reviewed, patient examined, no change in status, stable for surgery.  I have reviewed the patient's chart and labs.  Questions were answered to the patient's satisfaction.     [name]

## 2019-05-16 NOTE — Anesthesia Postprocedure Evaluation (Signed)
Anesthesia Post Note  Patient: Molly Walters  Procedure(s) Performed: INSERTION PORT-A-CATH (Right Chest)  Patient location during evaluation: PACU Anesthesia Type: General Level of consciousness: awake and alert Pain management: pain level controlled Vital Signs Assessment: post-procedure vital signs reviewed and stable Respiratory status: spontaneous breathing and respiratory function stable Cardiovascular status: stable Anesthetic complications: no     Last Vitals:  Vitals:   05/16/19 1117 05/16/19 1128  BP:  (!) 153/84  Pulse:  (!) 56  Resp:  16  Temp: 37.3 C 36.8 C  SpO2:  100%    Last Pain:  Vitals:   05/16/19 1128  TempSrc: Temporal  PainSc: 0-No pain                 KEPHART,WILLIAM K

## 2019-05-16 NOTE — Anesthesia Preprocedure Evaluation (Addendum)
Anesthesia Evaluation  Patient identified by MRN, date of birth, ID band Patient awake    Reviewed: Allergy & Precautions, NPO status , Patient's Chart, lab work & pertinent test results  History of Anesthesia Complications Negative for: history of anesthetic complications  Airway Mallampati: III       Dental   Pulmonary neg sleep apnea, neg COPD, Not current smoker, former smoker,           Cardiovascular hypertension, Pt. on medications (-) Past MI and (-) CHF (-) dysrhythmias (-) Valvular Problems/Murmurs     Neuro/Psych neg Seizures    GI/Hepatic Neg liver ROS, neg GERD  ,  Endo/Other  neg diabetes  Renal/GU negative Renal ROS     Musculoskeletal   Abdominal   Peds  Hematology   Anesthesia Other Findings   Reproductive/Obstetrics                            Anesthesia Physical Anesthesia Plan  ASA: II  Anesthesia Plan: General   Post-op Pain Management:    Induction: Intravenous  PONV Risk Score and Plan: Propofol infusion, TIVA and Midazolam  Airway Management Planned: Nasal Cannula  Additional Equipment:   Intra-op Plan:   Post-operative Plan:   Informed Consent: I have reviewed the patients History and Physical, chart, labs and discussed the procedure including the risks, benefits and alternatives for the proposed anesthesia with the patient or authorized representative who has indicated his/her understanding and acceptance.       Plan Discussed with:   Anesthesia Plan Comments:         Anesthesia Quick Evaluation

## 2019-05-19 ENCOUNTER — Ambulatory Visit: Payer: BC Managed Care – PPO | Admitting: Surgery

## 2019-05-20 ENCOUNTER — Other Ambulatory Visit: Payer: Self-pay

## 2019-05-20 DIAGNOSIS — Z171 Estrogen receptor negative status [ER-]: Secondary | ICD-10-CM

## 2019-05-20 DIAGNOSIS — C50312 Malignant neoplasm of lower-inner quadrant of left female breast: Secondary | ICD-10-CM

## 2019-05-21 ENCOUNTER — Ambulatory Visit
Admission: RE | Admit: 2019-05-21 | Discharge: 2019-05-21 | Disposition: A | Discharge status: Home / Self Care | Payer: BC Managed Care – PPO | Source: Ambulatory Visit | Attending: Oncology | Admitting: Oncology

## 2019-05-21 ENCOUNTER — Other Ambulatory Visit: Payer: Self-pay

## 2019-05-21 DIAGNOSIS — Z171 Estrogen receptor negative status [ER-]: Secondary | ICD-10-CM | POA: Insufficient documentation

## 2019-05-21 DIAGNOSIS — C50312 Malignant neoplasm of lower-inner quadrant of left female breast: Secondary | ICD-10-CM | POA: Diagnosis not present

## 2019-05-21 LAB — POCT I-STAT CREATININE: Creatinine, Ser: 0.9 mg/dL (ref 0.44–1.00)

## 2019-05-21 MED ORDER — GADOBUTROL 1 MMOL/ML IV SOLN
7.5000 mL | Freq: Once | INTRAVENOUS | Status: AC | PRN
  Administered 2019-05-21: 7.5 mL via INTRAVENOUS

## 2019-05-22 ENCOUNTER — Inpatient Hospital Stay: Payer: BC Managed Care – PPO | Attending: Oncology

## 2019-05-22 ENCOUNTER — Encounter: Payer: Self-pay | Admitting: Oncology

## 2019-05-22 ENCOUNTER — Inpatient Hospital Stay (HOSPITAL_BASED_OUTPATIENT_CLINIC_OR_DEPARTMENT_OTHER): Payer: BC Managed Care – PPO | Admitting: Oncology

## 2019-05-22 ENCOUNTER — Other Ambulatory Visit: Payer: Self-pay

## 2019-05-22 ENCOUNTER — Other Ambulatory Visit: Payer: Self-pay | Admitting: *Deleted

## 2019-05-22 ENCOUNTER — Other Ambulatory Visit: Payer: Self-pay | Admitting: Oncology

## 2019-05-22 ENCOUNTER — Inpatient Hospital Stay: Payer: BC Managed Care – PPO

## 2019-05-22 VITALS — BP 167/98 | HR 67 | Temp 98.7°F | Ht 66.0 in | Wt 178.0 lb

## 2019-05-22 DIAGNOSIS — Z923 Personal history of irradiation: Secondary | ICD-10-CM | POA: Diagnosis not present

## 2019-05-22 DIAGNOSIS — Z853 Personal history of malignant neoplasm of breast: Secondary | ICD-10-CM | POA: Insufficient documentation

## 2019-05-22 DIAGNOSIS — C50312 Malignant neoplasm of lower-inner quadrant of left female breast: Secondary | ICD-10-CM

## 2019-05-22 DIAGNOSIS — Z9221 Personal history of antineoplastic chemotherapy: Secondary | ICD-10-CM | POA: Diagnosis not present

## 2019-05-22 DIAGNOSIS — N6459 Other signs and symptoms in breast: Secondary | ICD-10-CM

## 2019-05-22 DIAGNOSIS — Z5111 Encounter for antineoplastic chemotherapy: Secondary | ICD-10-CM | POA: Insufficient documentation

## 2019-05-22 DIAGNOSIS — I10 Essential (primary) hypertension: Secondary | ICD-10-CM | POA: Insufficient documentation

## 2019-05-22 DIAGNOSIS — Z171 Estrogen receptor negative status [ER-]: Secondary | ICD-10-CM

## 2019-05-22 DIAGNOSIS — Z791 Long term (current) use of non-steroidal anti-inflammatories (NSAID): Secondary | ICD-10-CM | POA: Insufficient documentation

## 2019-05-22 DIAGNOSIS — F419 Anxiety disorder, unspecified: Secondary | ICD-10-CM | POA: Insufficient documentation

## 2019-05-22 DIAGNOSIS — N179 Acute kidney failure, unspecified: Secondary | ICD-10-CM | POA: Insufficient documentation

## 2019-05-22 DIAGNOSIS — Z7689 Persons encountering health services in other specified circumstances: Secondary | ICD-10-CM | POA: Insufficient documentation

## 2019-05-22 DIAGNOSIS — Z87891 Personal history of nicotine dependence: Secondary | ICD-10-CM | POA: Insufficient documentation

## 2019-05-22 DIAGNOSIS — Z79899 Other long term (current) drug therapy: Secondary | ICD-10-CM | POA: Insufficient documentation

## 2019-05-22 DIAGNOSIS — N6320 Unspecified lump in the left breast, unspecified quadrant: Secondary | ICD-10-CM

## 2019-05-22 DIAGNOSIS — Z7952 Long term (current) use of systemic steroids: Secondary | ICD-10-CM | POA: Insufficient documentation

## 2019-05-22 DIAGNOSIS — Z8042 Family history of malignant neoplasm of prostate: Secondary | ICD-10-CM | POA: Insufficient documentation

## 2019-05-22 DIAGNOSIS — E876 Hypokalemia: Secondary | ICD-10-CM | POA: Diagnosis not present

## 2019-05-22 DIAGNOSIS — Z95828 Presence of other vascular implants and grafts: Secondary | ICD-10-CM

## 2019-05-22 LAB — COMPREHENSIVE METABOLIC PANEL
ALT: 18 U/L (ref 0–44)
AST: 15 U/L (ref 15–41)
Albumin: 4.3 g/dL (ref 3.5–5.0)
Alkaline Phosphatase: 86 U/L (ref 38–126)
Anion gap: 11 (ref 5–15)
BUN: 21 mg/dL (ref 8–23)
CO2: 23 mmol/L (ref 22–32)
Calcium: 9.6 mg/dL (ref 8.9–10.3)
Chloride: 101 mmol/L (ref 98–111)
Creatinine, Ser: 0.9 mg/dL (ref 0.44–1.00)
GFR calc Af Amer: >60 mL/min (ref >60)
GFR calc non Af Amer: >60 mL/min (ref >60)
Glucose, Bld: 98 mg/dL (ref 70–99)
Potassium: 3.1 mmol/L — ABNORMAL LOW (ref 3.5–5.1)
Sodium: 135 mmol/L (ref 135–145)
Total Bilirubin: 0.9 mg/dL (ref 0.3–1.2)
Total Protein: 8.4 g/dL — ABNORMAL HIGH (ref 6.5–8.1)

## 2019-05-22 LAB — DIFFERENTIAL
Abs Immature Granulocytes: 0.02 10*3/uL (ref 0.00–0.07)
Basophils Absolute: 0 10*3/uL (ref 0.0–0.1)
Basophils Relative: 0 %
Eosinophils Absolute: 0.1 10*3/uL (ref 0.0–0.5)
Eosinophils Relative: 1 %
Immature Granulocytes: 0 %
Lymphocytes Relative: 37 %
Lymphs Abs: 1.8 10*3/uL (ref 0.7–4.0)
Monocytes Absolute: 0.5 10*3/uL (ref 0.1–1.0)
Monocytes Relative: 10 %
Neutro Abs: 2.5 10*3/uL (ref 1.7–7.7)
Neutrophils Relative %: 52 %

## 2019-05-22 LAB — CBC
HCT: 39.7 % (ref 36.0–46.0)
Hemoglobin: 13 g/dL (ref 12.0–15.0)
MCH: 27.3 pg (ref 26.0–34.0)
MCHC: 32.7 g/dL (ref 30.0–36.0)
MCV: 83.2 fL (ref 80.0–100.0)
Platelets: 290 10*3/uL (ref 150–400)
RBC: 4.77 MIL/uL (ref 3.87–5.11)
RDW: 12.5 % (ref 11.5–15.5)
WBC: 4.9 10*3/uL (ref 4.0–10.5)
nRBC: 0 % (ref 0.0–0.2)

## 2019-05-22 MED ORDER — SODIUM CHLORIDE 0.9 % IV SOLN
150.0000 mg | Freq: Once | INTRAVENOUS | Status: AC
  Administered 2019-05-22: 150 mg via INTRAVENOUS
  Filled 2019-05-22: qty 5

## 2019-05-22 MED ORDER — SODIUM CHLORIDE 0.9 % IV SOLN
600.0000 mg/m2 | Freq: Once | INTRAVENOUS | Status: AC
  Administered 2019-05-22: 12:00:00 1120 mg via INTRAVENOUS
  Filled 2019-05-22: qty 50

## 2019-05-22 MED ORDER — SODIUM CHLORIDE 0.9 % IV SOLN
Freq: Once | INTRAVENOUS | Status: AC
  Filled 2019-05-22: qty 250

## 2019-05-22 MED ORDER — POTASSIUM CHLORIDE CRYS ER 20 MEQ PO TBCR: 20.0000 meq | EXTENDED_RELEASE_TABLET | Freq: Every day | ORAL | 3 refills | Status: DC

## 2019-05-22 MED ORDER — DOXORUBICIN HCL CHEMO IV INJECTION 2 MG/ML
60.0000 mg/m2 | Freq: Once | INTRAVENOUS | Status: AC
  Administered 2019-05-22: 11:00:00 112 mg via INTRAVENOUS
  Filled 2019-05-22: qty 50

## 2019-05-22 MED ORDER — HEPARIN SOD (PORK) LOCK FLUSH 100 UNIT/ML IV SOLN
500.0000 [IU] | Freq: Once | INTRAVENOUS | Status: AC | PRN
  Administered 2019-05-22: 13:00:00 500 [IU]
  Filled 2019-05-22: qty 5

## 2019-05-22 MED ORDER — SODIUM CHLORIDE 0.9 % IV SOLN
10.0000 mg | Freq: Once | INTRAVENOUS | Status: AC
  Administered 2019-05-22: 10:00:00 10 mg via INTRAVENOUS
  Filled 2019-05-22: qty 1

## 2019-05-22 MED ORDER — SODIUM CHLORIDE 0.9% FLUSH
10.0000 mL | Freq: Once | INTRAVENOUS | Status: AC
  Administered 2019-05-22: 09:00:00 10 mL via INTRAVENOUS
  Filled 2019-05-22: qty 10

## 2019-05-22 MED ORDER — PALONOSETRON HCL INJECTION 0.25 MG/5ML
0.2500 mg | Freq: Once | INTRAVENOUS | Status: AC
  Administered 2019-05-22: 10:00:00 0.25 mg via INTRAVENOUS
  Filled 2019-05-22: qty 5

## 2019-05-22 NOTE — Progress Notes (Signed)
Patient starts her new chemo treatment today and is a bit nervous.

## 2019-05-22 NOTE — Addendum Note (Signed)
Addended by: Oneida Arenas on: 05/22/2019 03:47 PM   Modules accepted: Orders

## 2019-05-23 ENCOUNTER — Other Ambulatory Visit: Payer: Self-pay

## 2019-05-23 ENCOUNTER — Inpatient Hospital Stay: Payer: BC Managed Care – PPO

## 2019-05-23 DIAGNOSIS — C50312 Malignant neoplasm of lower-inner quadrant of left female breast: Secondary | ICD-10-CM

## 2019-05-23 MED ORDER — PEGFILGRASTIM-CBQV 6 MG/0.6ML ~~LOC~~ SOSY
6.0000 mg | PREFILLED_SYRINGE | Freq: Once | SUBCUTANEOUS | Status: AC
  Administered 2019-05-23: 14:00:00 6 mg via SUBCUTANEOUS
  Filled 2019-05-23: qty 0.6

## 2019-05-23 NOTE — Progress Notes (Signed)
Hematology/Oncology Consult note [facility]  [phone] [phone]  Patient Care Team: Lanny Hurst, MD as PCP - General (Internal Medicine) Rico Junker, RN as Registered Nurse Theodore Demark, RN as Registered Nurse   Name of the patient: Molly Walters  [id number]  [birth year]   Date of visit: 05/23/19  Diagnosis- clinical prognostic stage Ia invasive mammary carcinoma cT1 ccN0 cM0 ER/PR negative and HER-2/neu negative  Chief complaint/ Reason for visit-on treatment assessment prior to cycle 1 of dose dense AC chemotherapy  Heme/Onc history: patient is a 64 year old African-American female with past medical history significant for hypertension.  She has had prior breast cancer in 2001 s/p lumpectomy followed by adjuvant radiation treatment.  She did not require adjuvant chemotherapy.  She does not remember taking any endocrine therapy as well.More recently patient underwent a bilateral screening mammogram in November 2020 which showed a possible mass in the left breast.  This was followed by a diagnostic mammogram and ultrasound which showed a 1.6 cm mass at the 7 o'clock position in the left breast near the lumpectomy scar.  No left axillary adenopathy.  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  Patient has seen Dr. Adora Fridge and referred to [place] for further management.  Patient is independent of her ADLs and IADLs.  Family history significant for prostate cancer in her maternal uncle.  No other family history of breast, ovarian pancreatic colon cancer or melanoma.  Interval history-she is a little anxious to start chemotherapy today.  Overall she is doing well and denies any complaints at this time  ECOG PS- 1 Pain scale- 0   Review of systems- Review of Systems  Constitutional: Negative for chills, fever, malaise/fatigue and weight loss.  HENT: Negative for  congestion, ear discharge and nosebleeds.   Eyes: Negative for blurred vision.  Respiratory: Negative for cough, hemoptysis, sputum production, shortness of breath and wheezing.   Cardiovascular: Negative for chest pain, palpitations, orthopnea and claudication.  Gastrointestinal: Negative for abdominal pain, blood in stool, constipation, diarrhea, heartburn, melena, nausea and vomiting.  Genitourinary: Negative for dysuria, flank pain, frequency, hematuria and urgency.  Musculoskeletal: Negative for back pain, joint pain and myalgias.  Skin: Negative for rash.  Neurological: Negative for dizziness, tingling, focal weakness, seizures, weakness and headaches.  Endo/Heme/Allergies: Does not bruise/bleed easily.  Psychiatric/Behavioral: Negative for depression and suicidal ideas. The patient does not have insomnia.       No Known Allergies   Past Medical History:  Diagnosis Date   Breast cancer ([place]) 2001   left breast lumpectomy and rad tx   Breast cancer ([place]) 02/2019   left breast mass w/ biopsy   Hypertension    Personal history of radiation therapy 2001   BREAST CA     Past Surgical History:  Procedure Laterality Date   ABDOMINAL HYSTERECTOMY     BREAST BIOPSY Left 2001   POS   BREAST BIOPSY Left 2021   positive   BREAST LUMPECTOMY Left 2001   PORTACATH PLACEMENT Right 05/16/2019   Procedure: INSERTION PORT-A-CATH;  Surgeon: Jules Husbands, MD;  Location: [facility] ORS;  Service: General;  Laterality: Right;    Social History   Socioeconomic History   Marital status: Divorced    Spouse name: Not on file   Number of children: Not on file   Years of education: Not on file   Highest education level: Not on file  Occupational History   Not on file  Tobacco Use   Smoking status: Former Smoker    Packs/day: 0.50    Years: 5.00    Pack years: 2.50    Types: Cigarettes    Quit date: 1970    Years since quitting: 51.1   Smokeless tobacco: Never Used    Substance and Sexual Activity   Alcohol use: Never   Drug use: Never   Sexual activity: Not on file  Other Topics Concern   Not on file  Social History Narrative   Not on file   Social Determinants of Health   Financial Resource Strain:    Difficulty of Paying Living Expenses: Not on file  Food Insecurity:    Worried About Charity fundraiser in the Last Year: Not on file   [employer] of Food in the Last Year: Not on file  Transportation Needs:    Lack of Transportation (Medical): Not on file   Lack of Transportation (Non-Medical): Not on file  Physical Activity:    Days of Exercise per Week: Not on file   Minutes of Exercise per Session: Not on file  Stress:    Feeling of Stress : Not on file  Social Connections:    Frequency of Communication with Friends and Family: Not on file   Frequency of Social Gatherings with Friends and Family: Not on file   Attends Religious Services: Not on file   Active Member of Clubs or Organizations: Not on file   Attends Archivist Meetings: Not on file   Marital Status: Not on file  Intimate Partner Violence:    Fear of Current or Ex-Partner: Not on file   Emotionally Abused: Not on file   Physically Abused: Not on file   Sexually Abused: Not on file    Family History  Problem Relation Age of Onset   Hypertension Mother    Prostate cancer Paternal Uncle    Breast cancer Neg Hx      Current Outpatient Medications:    cholecalciferol (VITAMIN D3) 25 MCG (1000 UNIT) tablet, Take 1,000 Units by mouth daily., Disp: , Rfl:    hydrochlorothiazide (HYDRODIURIL) 25 MG tablet, Take 25 mg by mouth daily., Disp: , Rfl:    latanoprost (XALATAN) 0.005 % ophthalmic solution, Place 1 drop into both eyes at bedtime. , Disp: , Rfl:    lidocaine-prilocaine (EMLA) cream, Apply to affected area once, Disp: 30 g, Rfl: 3   losartan (COZAAR) 100 MG tablet, Take 100 mg by mouth daily., Disp: , Rfl:    timolol  (TIMOPTIC) 0.5 % ophthalmic solution, Place 1 drop into both eyes daily. , Disp: , Rfl:    acetaminophen (TYLENOL) 500 MG tablet, Take 500 mg by mouth every 6 (six) hours as needed., Disp: , Rfl:    dexamethasone (DECADRON) 4 MG tablet, Take 2 tablets by mouth once a day on the day after chemotherapy and then take 2 tablets two times a day for 2 days. Take with food. (Patient not taking: Reported on 05/22/2019), Disp: 30 tablet, Rfl: 1   LORazepam (ATIVAN) 0.5 MG tablet, Take 1 tablet (0.5 mg total) by mouth every 6 (six) hours as needed (Nausea or vomiting). (Patient not taking: Reported on 05/22/2019), Disp: 30 tablet, Rfl: 0   ondansetron (ZOFRAN) 8 MG tablet, Take 1 tablet (8 mg total) by mouth 2 (two) times daily as needed. Start on the third day after chemotherapy. (Patient not taking: Reported on 05/22/2019), Disp: 30 tablet,  Rfl: 1   potassium chloride SA (KLOR-CON) 20 MEQ tablet, Take 1 tablet (20 mEq total) by mouth daily., Disp: 14 tablet, Rfl: 3   prochlorperazine (COMPAZINE) 10 MG tablet, Take 1 tablet (10 mg total) by mouth every 6 (six) hours as needed (Nausea or vomiting). (Patient not taking: Reported on 05/22/2019), Disp: 30 tablet, Rfl: 1  Physical exam:  Vitals:   05/22/19 0920  BP: (!) 167/98  Pulse: 67  Temp: 98.7 F (37.1 C)  TempSrc: Tympanic  Weight: 178 lb (80.7 kg)  Height: _0  (1.676 m)   Physical Exam HENT:     Head: Normocephalic and atraumatic.  Eyes:     Pupils: Pupils are equal, round, and reactive to light.  Cardiovascular:     Rate and Rhythm: Normal rate and regular rhythm.     Heart sounds: Normal heart sounds.  Pulmonary:     Effort: Pulmonary effort is normal.     Breath sounds: Normal breath sounds.  Abdominal:     General: Bowel sounds are normal.     Palpations: Abdomen is soft.  Musculoskeletal:     Cervical back: Normal range of motion.  Skin:    General: Skin is warm and dry.  Neurological:     Mental Status: She is alert and oriented  to person, place, and time.   Breast exam: No palpable left axillary adenopathy.  There is significant induration in the area of prior surgery and it is difficult to ascertain the exact size of the mass which is roughly close to 2 cm  CMP Latest Ref Rng & Units 05/22/2019  Glucose 70 - 99 mg/dL 98  BUN 8 - 23 mg/dL 21  Creatinine 0.44 - 1.00 mg/dL 0.90  Sodium 135 - 145 mmol/L 135  Potassium 3.5 - 5.1 mmol/L 3.1(L)  Chloride 98 - 111 mmol/L 101  CO2 22 - 32 mmol/L 23  Calcium 8.9 - 10.3 mg/dL 9.6  Total Protein 6.5 - 8.1 g/dL 8.4(H)  Total Bilirubin 0.3 - 1.2 mg/dL 0.9  Alkaline Phos 38 - 126 U/L 86  AST 15 - 41 U/L 15  ALT 0 - 44 U/L 18   CBC Latest Ref Rng & Units 05/22/2019  WBC 4.0 - 10.5 K/uL 4.9  Hemoglobin 12.0 - 15.0 g/dL 13.0  Hematocrit 36.0 - 46.0 % 39.7  Platelets 150 - 400 K/uL 290    No images are attached to the encounter.  DG Chest 1 View  Result Date: 05/16/2019 CLINICAL DATA:  New diagnosis of left breast cancer. Status post power port insertion. EXAM: CHEST  1 VIEW COMPARISON:  None FINDINGS: Power port has been inserted. The tip is in good position in the superior vena cava approximately 3.5 cm below the carina. No pneumothorax. Heart size and vascularity are normal. Lungs are clear. No bone abnormality. IMPRESSION: Power port in good position. Electronically Signed   By: Lorriane Shire M.D.   On: 05/16/2019 11:16   NM Cardiac Muga Rest  Result Date: 05/15/2019 CLINICAL DATA:  Breast cancer, pre cardiotoxic chemotherapy EXAM: NUCLEAR MEDICINE CARDIAC BLOOD POOL IMAGING (MUGA) TECHNIQUE: Cardiac multi-gated acquisition was performed at rest following intravenous injection of Tc-41mlabeled red blood cells. RADIOPHARMACEUTICALS:  21.53 mCi Tc-937mertechnetate in-vitro labeled red blood cells IV COMPARISON:  None FINDINGS: Calculated LEFT ventricular ejection fraction is 66%, normal. Study was obtained at a cardiac rate of 60 bpm. Patient was rhythmic during imaging.  Cine analysis of the LEFT ventricle in 3 projections demonstrates normal LV  wall motion. IMPRESSION: Normal LEFT ventricular ejection fraction of 66% with normal LV wall motion. Electronically Signed   By: Lavonia Dana M.D.   On: 05/15/2019 16:31   MR BREAST BILATERAL W WO CONTRAST INC CAD  Result Date: 05/21/2019 CLINICAL DATA:  64 year old female with newly diagnosed left breast cancer in January 2021. Patient has a history of remote left lumpectomy for cancer 20 years ago. LABS:  None performed on site. EXAM: BILATERAL BREAST MRI WITH AND WITHOUT CONTRAST TECHNIQUE: Multiplanar, multisequence MR images of both breasts were obtained prior to and following the intravenous administration of 7.5 ml of Gadavist. Three-dimensional MR images were rendered by post-processing of the original MR data on an independent workstation. The three-dimensional MR images were interpreted, and findings are reported in the following complete MRI report for this study. Three dimensional images were evaluated at the independent DynaCad workstation COMPARISON:  Previous exam(s). FINDINGS: Breast composition: c. Heterogeneous fibroglandular tissue. Background parenchymal enhancement: Mild on the left and moderate on the right. Right breast: Linear non mass enhancement is identified in the lower inner quadrant of the right breast spanning from mid to posterior depth (series 6, image 31/112. This spans approximately 5.3 cm in the AP dimension. No additional dominant masses or suspicious enhancement within the remainder of the right breast. Left breast: Susceptibility artifact from post biopsy clip is seen in association with an irregular, enhancing mass in the lower inner quadrant of the left breast at middle depth (series 6, image 26/112). This is consistent with the patient's site of recently diagnosed malignancy. The mass measures 2.4 x 1.9 x 1.7 cm. The mass appears to tether and dimple the overlying skin, though no definite skin  enhancement is seen. Two additional enhancing masses are identified within the superior left breast. This includes a 9 x 6 x 10 mm mass (series 6, image 76/112) and a 5 x 5 x 5 mm mass (series 6, image 68/112). Both masses demonstrate rapid uptake and suspicious washout kinetics. Lymph nodes: There is no suspicious left axillary or internal mammary chain lymphadenopathy. However, note is made of several prominent right axillary lymph nodes demonstrating up to 8-9 mm of cortical thickness. Ancillary findings:  None. IMPRESSION: 1. 2.4 cm mass consistent with the patient's biopsy-proven site of malignancy in the lower inner left breast. 2. Two additional suspicious masses in the superior left breast measuring 9 and 5 mm. Recommendation is for second-look ultrasound with subsequent biopsy to follow. If ultrasound correlates cannot be identified, MRI guided biopsy is recommended. 3. Suspicious linear enhancement spanning 5 cm in the lower inner quadrant of the right breast. Recommendation is for MRI guided biopsy. 4. Prominent right axillary lymph nodes demonstrating 8-9 mm cortical thickness. Recommendation is for second-look ultrasound with subsequent biopsy as indicated. RECOMMENDATION: 1. Recommendation is that the patient first return for bilateral second-look ultrasound and possible ultrasound-guided biopsies. This should include the superior left breast for further evaluation of 2 possible masses measuring 9 and 5 mm (series 6, image 76 and 68/112) as well as the right axilla for prominent lymph nodes. 2. Pending above results, the patient may require additional MRI guided biopsy of 1 or both of the superior left breast masses. 3. Additionally, the patient will require MRI guided biopsy of suspicious linear enhancement in the medial right breast. Biopsies of both the anterior and posterior margin should be performed. This will likely require a medial approach and would require a separate visit from left-sided MRI  guided biopsy (if they  are to be performed). BI-RADS CATEGORY  4: Suspicious. Electronically Signed   By: Kristopher Oppenheim M.D.   On: 05/21/2019 12:46   DG C-Arm 1-60 Min-No Report  Result Date: 05/16/2019 Fluoroscopy was utilized by the requesting physician.  No radiographic interpretation.   MM CLIP PLACEMENT LEFT  Result Date: 05/05/2019 CLINICAL DATA:  Status post ultrasound-guided core biopsy of a mass in the left breast. EXAM: DIAGNOSTIC LEFT MAMMOGRAM POST ULTRASOUND BIOPSY COMPARISON:  Previous exam(s). FINDINGS: Mammographic images were obtained following ultrasound guided biopsy of a mass in the 6:30 region of the left breast. The venous shaped biopsy marker clip is in appropriate position in the lower-inner quadrant of the left breast. IMPRESSION: Appropriate positioning of the venous shaped biopsy marking clip at the site of biopsy in the lower-inner quadrant of the left breast. Final Assessment: Post Procedure Mammograms for Marker Placement Electronically Signed   By: Lillia Mountain M.D.   On: 05/05/2019 09:15   [place] LT BREAST BX W LOC DEV 1ST LESION IMG BX SPEC US GUIDE  Addendum Date: 05/07/2019   ADDENDUM REPORT: 05/07/2019 16:22 ADDENDUM: PATHOLOGY revealed: A. BREAST, LEFT, 6:30; ULTRASOUND-GUIDED CORE BIOPSY: - INVASIVE MAMMARY CARCINOMA, NO SPECIAL TYPE. 12 mm in this sample : Grade 3. Ductal carcinoma in situ: Not identified Lymphovascular invasion: Not identified. Pathology results are CONCORDANT with imaging findings, per Dr. Lillia Mountain. Pathology results and recommendations were discussed with patient by telephone on 05/06/2019. Patient reported biopsy site doing well with slight tenderness at the site. Post biopsy care instructions were reviewed and questions were answered. Patient was instructed to call [facility] if any concerns or questions arise related to the biopsy. Recommendation: Surgical referral. Request for surgical referral was relayed to nurse navigators at  [facility] by Electa Sniff RN on 05/06/2019. Addendum by Electa Sniff RN on 05/07/2019. Electronically Signed   By: Lillia Mountain M.D.   On: 05/07/2019 16:22   Result Date: 05/07/2019 CLINICAL DATA:  Left breast mass. EXAM: ULTRASOUND GUIDED LEFT BREAST CORE NEEDLE BIOPSY COMPARISON:  Previous exam(s). FINDINGS: I met with the patient and we discussed the procedure of ultrasound-guided biopsy, including benefits and alternatives. We discussed the high likelihood of a successful procedure. We discussed the risks of the procedure, including infection, bleeding, tissue injury, clip migration, and inadequate sampling. Informed written consent was given. The usual time-out protocol was performed immediately prior to the procedure. Lesion quadrant: Lower inner quadrant Using sterile technique and 1% lidocaine and 1% lidocaine with epinephrine as local anesthetic, under direct ultrasound visualization, a 12 gauge spring-loaded device was used to perform biopsy of a mass in the 6:30 region of the left breast using a lateral to medial approach. At the conclusion of the procedure venous shaped tissue marker clip was deployed into the biopsy cavity. Follow up 2 view mammogram was performed and dictated separately. IMPRESSION: Ultrasound guided biopsy of the left breast. No apparent complications. Electronically Signed: By: Lillia Mountain M.D. On: 05/05/2019 09:05     Assessment and plan- Patient is a 64 y.o. 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  She is here for on treatment assessment prior to cycle 1 of dose dense AC chemotherapy and to discuss results of MRI  I have reviewed MRI breast findings and discussed with the patient in detail. The size of the primary left breast mass appears larger on MRI close to 2.4 x 1.9 x 1.7  cm.  She is also noted to have 2 additional enhancing masses 9 x 6 x 10 mm and 5 x 5 x 5 mm in the left  breast with suspicious washout kinetics.  No pathological left axillary adenopathy.  Prominent lymph nodes seen on the right side along with non-mass enhancement spanning 5.3 cm in the AP dimension.  She will be getting a repeat second look bilateral breast ultrasound followed by MRI guided biopsy of 2 suspicious masses in the left breast as well as biopsy of the enhancement in the right breast.  I will discuss her case at tumor board as well.  Given more apparent disease on MRI I will consider adding carboplatin to Taxol after [facility] chemotherapy if her counts can tolerate.  Counts are otherwise okay to proceed with cycle 1 of dose dense AC chemotherapy today.  She will come back on day 2 to receive Udenyca.  Discussed risks and benefits of chemotherapy including all but not limited to nausea, vomiting, low blood counts, risk of infections and hospitalization.  Risk of cardiotoxicity and hair loss.  Treatment is being given with a curative intent.  Patient understands and agrees to proceed as planned.  Hopefully patient will have the biopsy of the suspicious areas in her bilateral breasts prior to her next treatment.  I will see her back in 2 weeks time with CBC with differential, CMP for cycle 2 Visit Diagnosis 1. Encounter for antineoplastic chemotherapy   2. Malignant neoplasm of lower-inner quadrant of left breast in female, estrogen receptor negative ([place])      Dr. Randa Evens, MD, MPH [facility] [phone] 05/23/2019 12:37 PM

## 2019-05-26 ENCOUNTER — Telehealth: Payer: Self-pay

## 2019-05-26 NOTE — Telephone Encounter (Signed)
Telephone call to patient for follow up after receiving first chemo last week.   Pt states did really well and no complaints except fatigue.  States eating well and drinking plenty of fluids.  Encouraged patient to call for any questions or concerns.

## 2019-05-28 ENCOUNTER — Other Ambulatory Visit: Payer: Self-pay | Admitting: *Deleted

## 2019-05-28 ENCOUNTER — Telehealth: Payer: Self-pay | Admitting: *Deleted

## 2019-05-28 MED ORDER — PANTOPRAZOLE SODIUM 20 MG PO TBEC: 20.0000 mg | DELAYED_RELEASE_TABLET | Freq: Every day | ORAL | 3 refills | Status: DC

## 2019-05-28 NOTE — Telephone Encounter (Signed)
Called pt for f/u from 1st chemo last week. She was having indigestion -burning sensation. She did not have any nausea. She was taking tums but it is not helping. She also said that she has checked 3 times with pharmacy and they do not have potassium rx. I called and checked with pharmacy and they do have the rx and she can pick it up today, I spoke with rao and she says to order protonix once a day and I put rx in and called pt to let her know about new rx ready for her and the potassium is available and she can pick it up also. Dr. Janese Banks made aware that pt has not taken the potassium until today starting

## 2019-05-30 ENCOUNTER — Ambulatory Visit
Admission: RE | Admit: 2019-05-30 | Discharge: 2019-05-30 | Disposition: A | Discharge status: Home / Self Care | Payer: BC Managed Care – PPO | Source: Ambulatory Visit | Attending: Oncology | Admitting: Oncology

## 2019-05-30 DIAGNOSIS — N6320 Unspecified lump in the left breast, unspecified quadrant: Secondary | ICD-10-CM

## 2019-05-30 DIAGNOSIS — N6459 Other signs and symptoms in breast: Secondary | ICD-10-CM | POA: Diagnosis present

## 2019-06-02 ENCOUNTER — Telehealth: Payer: Self-pay | Admitting: *Deleted

## 2019-06-02 LAB — SURGICAL PATHOLOGY

## 2019-06-02 NOTE — Telephone Encounter (Signed)
She has an appt on Thursday. We will inform her then. Will not change management. thanks

## 2019-06-02 NOTE — Telephone Encounter (Signed)
Linda Nash with [facility]  Called reporting that patient has a positive Breast biopsy and she has not been able to reach the patient to let her know this before she sees it in MyChart. She requests that our office attempt to reach patient with results.  ) Component 3 d ago  SURGICAL PATHOLOGY SURGICAL PATHOLOGY  CASE: [id number]  PATIENT: Molly Walters  Surgical Pathology Report      Specimen Submitted:  A. Breast, left 11:00 9cmfn  B. Breast, left 11:00 10cmfn  C. Axillary lymph node, right   Clinical History: History of left breast cancer, s/p lumpectomy, with  recurrence in Jan 2021. Two additional left breast masses identified on  MRI.       DIAGNOSIS:  A. LEFT BREAST, 11:00 9 CMFN; ULTRASOUND-GUIDED BIOPSY:  - INVASIVE MAMMARY CARCINOMA, AS DESCRIBED BELOW (SEE PART B).   B. LEFT BREAST, 11:00 10 CMFN; ULTRASOUND-GUIDED BIOPSY:  - INVASIVE MAMMARY CARCINOMA, NO SPECIAL TYPE.   Size of invasive carcinoma: 7 mm in this sample  Histologic grade of invasive carcinoma: Grade 3            Glandular/tubular differentiation score: 3            Nuclear pleomorphism score: 3            Mitotic rate score: 2            Total score: 8  Ductal carcinoma in situ: Not identified  Lymphovascular invasion: Not identified   Comment:  The tumor is compared to that in the previous biopsy case [id number] and  is morphologically similar. Findings were communicated Linda Nash, RN,  via [facility] secure chat on 06/02/19.   BREAST BIOMARKER TESTS - performed on previous biopsy [id number]:  Estrogen Receptor (ER) Status: NEGATIVE (LESS THAN 1%)   Progesterone Receptor (PgR) Status: NEGATIVE (LESS THAN 1%)  HER2 (by immunohistochemistry): NEGATIVE (Score 1+)     C. AXILLARY LYMPH NODE, RIGHT; ULTRASOUND-GUIDED BIOPSY:  - BENIGN LYMPH NODE.  - NEGATIVE FOR METASTATIC CARCINOMA.   GROSS DESCRIPTION:  A. Labeled: Ultrasound-guided left  breast biopsy at the 11 o'clock  position and 9 cm from nipple  Received: In formalin  Time/date in fixative: Tissue procedure time 9:13 AM, tissue put in  formalin time 9:16 AM on 05/30/2019  Cold ischemic time: Less than 1 minute  Total fixation time: 8 hours  Core pieces: 3  Size: Ranging from 0.8-1.1 cm in length and 0.1 cm in diameter  Description: Fibrofatty soft tissue cores  Ink color: Green  Entirely submitted in 1 cassette.   B. Labeled: Ultrasound-guided left breast core biopsy at the 11 o'clock  position and 10 cm from nipple  Received: In formalin  Time/date in fixative: Tissue procedure time 9:28 AM, tissue put in  formalin time 9:28 AM on 05/30/2019  Cold ischemic time: Less than 1 minute  Total fixation time: 8 hours  Core pieces: 4  Size: Ranging from 0.7-1.1 cm in length and 0.1 cm in diameter  Description: Fibrofatty soft tissue cores  Ink color: Black  Entirely submitted in 1 cassette.   C. Labeled: Ultrasound-guided right axilla biopsy  Received: In formalin  Time/date in fixative: Tissue procedure time 9:50 AM, tissue put in  formalin time 9:50 AM on 05/30/2019  Cold ischemic time: Less than 1 minute  Total fixation time: 7 hours  Core pieces: 3  Size: Ranging from 0.7-1.2 cm in length and 0.1 cm in diameter  Description: Fibrofatty soft tissue cores    Ink color: Yellow  Entirely submitted in 1 cassette.         Final Diagnosis performed by Alyssa Kraynie, MD.  Electronically signed  06/02/2019 3:11:07PM  The electronic signature indicates that the named Attending Pathologist  has evaluated the specimen  Technical component performed at [facility], [address], [place],  [place] [address] Lab: [phone] Dir: Sanjai Nagendra, MD, MMM  Professional component performed at [facility], [facility], [address], [place], [place] [address] Lab: [phone]  Dir: Tara C. Rubinas, MD   Resulting Agency [facility]      Specimen  Collected: 05/30/19 09:13 Last Resulted: 06/02/19 15:28        

## 2019-06-04 ENCOUNTER — Telehealth: Payer: Self-pay | Admitting: *Deleted

## 2019-06-04 NOTE — Telephone Encounter (Signed)
Addended report suggesting an MRI biopsy of 2 sites in the other breast before sending patient fr surgical evaluation.

## 2019-06-05 ENCOUNTER — Inpatient Hospital Stay: Payer: BC Managed Care – PPO

## 2019-06-05 ENCOUNTER — Other Ambulatory Visit: Payer: Self-pay

## 2019-06-05 ENCOUNTER — Inpatient Hospital Stay (HOSPITAL_BASED_OUTPATIENT_CLINIC_OR_DEPARTMENT_OTHER): Payer: BC Managed Care – PPO | Admitting: Oncology

## 2019-06-05 ENCOUNTER — Encounter: Payer: Self-pay | Admitting: Oncology

## 2019-06-05 ENCOUNTER — Inpatient Hospital Stay: Payer: BC Managed Care – PPO | Admitting: Oncology

## 2019-06-05 ENCOUNTER — Other Ambulatory Visit: Payer: Self-pay | Admitting: *Deleted

## 2019-06-05 ENCOUNTER — Encounter: Payer: Self-pay | Admitting: *Deleted

## 2019-06-05 VITALS — BP 137/80 | HR 70 | Temp 97.0°F | Resp 19

## 2019-06-05 VITALS — BP 133/84 | HR 79 | Resp 16 | Ht 66.0 in | Wt 179.4 lb

## 2019-06-05 DIAGNOSIS — N179 Acute kidney failure, unspecified: Secondary | ICD-10-CM | POA: Diagnosis not present

## 2019-06-05 DIAGNOSIS — Z171 Estrogen receptor negative status [ER-]: Secondary | ICD-10-CM

## 2019-06-05 DIAGNOSIS — E876 Hypokalemia: Secondary | ICD-10-CM

## 2019-06-05 DIAGNOSIS — C50312 Malignant neoplasm of lower-inner quadrant of left female breast: Secondary | ICD-10-CM

## 2019-06-05 DIAGNOSIS — Z95828 Presence of other vascular implants and grafts: Secondary | ICD-10-CM

## 2019-06-05 DIAGNOSIS — N63 Unspecified lump in unspecified breast: Secondary | ICD-10-CM

## 2019-06-05 DIAGNOSIS — Z5111 Encounter for antineoplastic chemotherapy: Secondary | ICD-10-CM | POA: Diagnosis not present

## 2019-06-05 LAB — CBC WITH DIFFERENTIAL/PLATELET
Abs Immature Granulocytes: 0.84 10*3/uL — ABNORMAL HIGH (ref 0.00–0.07)
Basophils Absolute: 0.1 10*3/uL (ref 0.0–0.1)
Basophils Relative: 1 %
Eosinophils Absolute: 0 10*3/uL (ref 0.0–0.5)
Eosinophils Relative: 0 %
HCT: 33.7 % — ABNORMAL LOW (ref 36.0–46.0)
Hemoglobin: 11 g/dL — ABNORMAL LOW (ref 12.0–15.0)
Immature Granulocytes: 8 %
Lymphocytes Relative: 17 %
Lymphs Abs: 1.7 10*3/uL (ref 0.7–4.0)
MCH: 27.6 pg (ref 26.0–34.0)
MCHC: 32.6 g/dL (ref 30.0–36.0)
MCV: 84.5 fL (ref 80.0–100.0)
Monocytes Absolute: 1 10*3/uL (ref 0.1–1.0)
Monocytes Relative: 10 %
Neutro Abs: 6.7 10*3/uL (ref 1.7–7.7)
Neutrophils Relative %: 64 %
Platelets: 197 10*3/uL (ref 150–400)
RBC: 3.99 MIL/uL (ref 3.87–5.11)
RDW: 12.5 % (ref 11.5–15.5)
WBC: 10.3 10*3/uL (ref 4.0–10.5)
nRBC: 0.2 % (ref 0.0–0.2)

## 2019-06-05 LAB — COMPREHENSIVE METABOLIC PANEL
ALT: 21 U/L (ref 0–44)
AST: 19 U/L (ref 15–41)
Albumin: 3.7 g/dL (ref 3.5–5.0)
Alkaline Phosphatase: 86 U/L (ref 38–126)
Anion gap: 11 (ref 5–15)
BUN: 31 mg/dL — ABNORMAL HIGH (ref 8–23)
CO2: 23 mmol/L (ref 22–32)
Calcium: 8.9 mg/dL (ref 8.9–10.3)
Chloride: 104 mmol/L (ref 98–111)
Creatinine, Ser: 1.61 mg/dL — ABNORMAL HIGH (ref 0.44–1.00)
GFR calc Af Amer: 39 mL/min — ABNORMAL LOW (ref >60)
GFR calc non Af Amer: 34 mL/min — ABNORMAL LOW (ref >60)
Glucose, Bld: 135 mg/dL — ABNORMAL HIGH (ref 70–99)
Potassium: 3.1 mmol/L — ABNORMAL LOW (ref 3.5–5.1)
Sodium: 138 mmol/L (ref 135–145)
Total Bilirubin: 0.3 mg/dL (ref 0.3–1.2)
Total Protein: 8 g/dL (ref 6.5–8.1)

## 2019-06-05 MED ORDER — PALONOSETRON HCL INJECTION 0.25 MG/5ML
0.2500 mg | Freq: Once | INTRAVENOUS | Status: AC
  Administered 2019-06-05: 0.25 mg via INTRAVENOUS
  Filled 2019-06-05: qty 5

## 2019-06-05 MED ORDER — DOXORUBICIN HCL CHEMO IV INJECTION 2 MG/ML
60.0000 mg/m2 | Freq: Once | INTRAVENOUS | Status: AC
  Administered 2019-06-05: 112 mg via INTRAVENOUS
  Filled 2019-06-05: qty 10

## 2019-06-05 MED ORDER — SODIUM CHLORIDE 0.9 % IV SOLN
Freq: Once | INTRAVENOUS | Status: AC
  Filled 2019-06-05: qty 250

## 2019-06-05 MED ORDER — SODIUM CHLORIDE 0.9 % IV SOLN
10.0000 mg | Freq: Once | INTRAVENOUS | Status: AC
  Administered 2019-06-05: 10 mg via INTRAVENOUS
  Filled 2019-06-05: qty 1

## 2019-06-05 MED ORDER — HEPARIN SOD (PORK) LOCK FLUSH 100 UNIT/ML IV SOLN
500.0000 [IU] | Freq: Once | INTRAVENOUS | Status: AC | PRN
  Administered 2019-06-05: 500 [IU]
  Filled 2019-06-05: qty 5

## 2019-06-05 MED ORDER — SODIUM CHLORIDE 0.9 % IV SOLN
Freq: Once | INTRAVENOUS | Status: AC
  Filled 2019-06-05: qty 1000

## 2019-06-05 MED ORDER — SODIUM CHLORIDE 0.9 % IV SOLN
150.0000 mg | Freq: Once | INTRAVENOUS | Status: AC
  Administered 2019-06-05: 150 mg via INTRAVENOUS
  Filled 2019-06-05: qty 5

## 2019-06-05 MED ORDER — SODIUM CHLORIDE 0.9% FLUSH
10.0000 mL | Freq: Once | INTRAVENOUS | Status: AC
  Administered 2019-06-05: 11:00:00 10 mL via INTRAVENOUS
  Filled 2019-06-05: qty 10

## 2019-06-05 MED ORDER — HEPARIN SOD (PORK) LOCK FLUSH 100 UNIT/ML IV SOLN
INTRAVENOUS | Status: AC
  Filled 2019-06-05: qty 5

## 2019-06-05 MED ORDER — SODIUM CHLORIDE 0.9 % IV SOLN
600.0000 mg/m2 | Freq: Once | INTRAVENOUS | Status: AC
  Administered 2019-06-05: 1120 mg via INTRAVENOUS
  Filled 2019-06-05: qty 50

## 2019-06-05 NOTE — Progress Notes (Signed)
Pt is a new start for chemo AC today, she had mri breast and had to get mult. Bx. md to discuss. She also fell on a step and hurt her knee and she has brace on it.

## 2019-06-05 NOTE — Progress Notes (Signed)
Cr 1.61, ok to treat per MD

## 2019-06-06 ENCOUNTER — Inpatient Hospital Stay: Payer: BC Managed Care – PPO

## 2019-06-06 ENCOUNTER — Other Ambulatory Visit: Payer: Self-pay

## 2019-06-06 DIAGNOSIS — Z171 Estrogen receptor negative status [ER-]: Secondary | ICD-10-CM

## 2019-06-06 DIAGNOSIS — C50312 Malignant neoplasm of lower-inner quadrant of left female breast: Secondary | ICD-10-CM | POA: Diagnosis not present

## 2019-06-06 MED ORDER — PEGFILGRASTIM-CBQV 6 MG/0.6ML ~~LOC~~ SOSY
6.0000 mg | PREFILLED_SYRINGE | Freq: Once | SUBCUTANEOUS | Status: AC
  Administered 2019-06-06: 6 mg via SUBCUTANEOUS
  Filled 2019-06-06: qty 0.6

## 2019-06-06 NOTE — Progress Notes (Signed)
Hematology/Oncology Consult note [facility]  Telephone:([phone] [phone]  Patient Care Team: Lanny Hurst, MD as PCP - General (Internal Medicine) Rico Junker, RN as Registered Nurse Theodore Demark, RN as Registered Nurse   Name of the patient: Molly Walters  [id number]  [birth year]   Date of visit: 06/06/19  Diagnosis- clinical prognostic stage Ia invasive mammary carcinoma cmT1 ccN0 cM0 ER/PR negative and HER-2/neu negative   Chief complaint/ Reason for visit-on treatment assessment prior to cycle 2 of dose dense AC chemotherapy  Heme/Onc history: patient is a 64 year old African-American female with past medical history significant for hypertension. She has had prior breast cancer in 2001 s/p lumpectomy followed by adjuvant radiation treatment. She did not require adjuvant chemotherapy. She does not remember taking any endocrine therapy as well.More recently patient underwent a bilateral screening mammogram in November 2020 which showed a possible mass in the left breast. This was followed by a diagnostic mammogram and ultrasound which showed a 1.6 cm mass at the 7 o'clock position in the left breast near the lumpectomy scar. No left axillary adenopathy. 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 Patient has seen Dr. Adora Fridge and referred to [place] for further management.Patient is independent of her ADLs and IADLs. Family history significant for prostate cancer in her maternal uncle. No other family history of breast, ovarian pancreatic colon cancer or melanoma.  MRI bilateral breast showed:The size of the primary left breast mass appears larger on MRI close to 2.4 x 1.9 x 1.7 cm.  She is also noted to have 2 additional enhancing masses 9 x 6 x 10 mm and 5 x 5 x 5 mm in the left breast with suspicious washout kinetics.  No pathological left axillary adenopathy.   Prominent lymph nodes seen on the right side along with non-mass enhancement spanning 5.3 cm in the AP dimension.   Biopsy of both the left breast masses positive for triple negative breast cancer.  Right axillary lymph node biopsy negative for malignancy.  Biopsy of the right breast non-mass enhancement pending  Interval history-patient missed her step and twisted her ankle.  She currently has a brace in place.  No fracture.  Reports her oral intake has not been as good as she would like.  Denies any significant nausea or vomiting.  ECOG PS- 1 Pain scale- 0 Opioid associated constipation- no  Review of systems- Review of Systems  Constitutional: Positive for malaise/fatigue. Negative for chills, fever and weight loss.  HENT: Negative for congestion, ear discharge and nosebleeds.   Eyes: Negative for blurred vision.  Respiratory: Negative for cough, hemoptysis, sputum production, shortness of breath and wheezing.   Cardiovascular: Negative for chest pain, palpitations, orthopnea and claudication.  Gastrointestinal: Negative for abdominal pain, blood in stool, constipation, diarrhea, heartburn, melena, nausea and vomiting.  Genitourinary: Negative for dysuria, flank pain, frequency, hematuria and urgency.  Musculoskeletal: Negative for back pain, joint pain and myalgias.  Skin: Negative for rash.  Neurological: Negative for dizziness, tingling, focal weakness, seizures, weakness and headaches.  Endo/Heme/Allergies: Does not bruise/bleed easily.  Psychiatric/Behavioral: Negative for depression and suicidal ideas. The patient does not have insomnia.       No Known Allergies   Past Medical History:  Diagnosis Date   Breast cancer ([place]) 2001   left breast lumpectomy and rad tx   Breast cancer ([place]) 02/2019   left breast mass w/ biopsy   Hypertension  Personal history of radiation therapy 2001   BREAST CA     Past Surgical History:  Procedure Laterality Date   ABDOMINAL  HYSTERECTOMY     BREAST BIOPSY Left 2001   POS   BREAST BIOPSY Left 2021   positive   BREAST LUMPECTOMY Left 2001   PORTACATH PLACEMENT Right 05/16/2019   Procedure: INSERTION PORT-A-CATH;  Surgeon: Jules Husbands, MD;  Location: [facility] ORS;  Service: General;  Laterality: Right;    Social History   Socioeconomic History   Marital status: Divorced    Spouse name: Not on file   Number of children: Not on file   Years of education: Not on file   Highest education level: Not on file  Occupational History   Not on file  Tobacco Use   Smoking status: Former Smoker    Packs/day: 0.50    Years: 5.00    Pack years: 2.50    Types: Cigarettes    Quit date: 1970    Years since quitting: 51.1   Smokeless tobacco: Never Used  Substance and Sexual Activity   Alcohol use: Never   Drug use: Never   Sexual activity: Not Currently  Other Topics Concern   Not on file  Social History Narrative   Not on file   Social Determinants of Health   Financial Resource Strain:    Difficulty of Paying Living Expenses: Not on file  Food Insecurity:    Worried About Charity fundraiser in the Last Year: Not on file   [employer] of Food in the Last Year: Not on file  Transportation Needs:    Lack of Transportation (Medical): Not on file   Lack of Transportation (Non-Medical): Not on file  Physical Activity:    Days of Exercise per Week: Not on file   Minutes of Exercise per Session: Not on file  Stress:    Feeling of Stress : Not on file  Social Connections:    Frequency of Communication with Friends and Family: Not on file   Frequency of Social Gatherings with Friends and Family: Not on file   Attends Religious Services: Not on file   Active Member of Clubs or Organizations: Not on file   Attends Archivist Meetings: Not on file   Marital Status: Not on file  Intimate Partner Violence:    Fear of Current or Ex-Partner: Not on file   Emotionally Abused:  Not on file   Physically Abused: Not on file   Sexually Abused: Not on file    Family History  Problem Relation Age of Onset   Hypertension Mother    Prostate cancer Paternal Uncle    Breast cancer Neg Hx      Current Outpatient Medications:    cholecalciferol (VITAMIN D3) 25 MCG (1000 UNIT) tablet, Take 1,000 Units by mouth daily., Disp: , Rfl:    dexamethasone (DECADRON) 4 MG tablet, Take 2 tablets by mouth once a day on the day after chemotherapy and then take 2 tablets two times a day for 2 days. Take with food., Disp: 30 tablet, Rfl: 1   hydrochlorothiazide (HYDRODIURIL) 25 MG tablet, Take 25 mg by mouth daily., Disp: , Rfl:    ibuprofen (ADVIL) 600 MG tablet, Take 600 mg by mouth 3 (three) times daily., Disp: , Rfl:    latanoprost (XALATAN) 0.005 % ophthalmic solution, Place 1 drop into both eyes at bedtime. , Disp: , Rfl:    lidocaine-prilocaine (EMLA) cream, Apply to affected area  once, Disp: 30 g, Rfl: 3   LORazepam (ATIVAN) 0.5 MG tablet, Take 1 tablet (0.5 mg total) by mouth every 6 (six) hours as needed (Nausea or vomiting)., Disp: 30 tablet, Rfl: 0   losartan (COZAAR) 100 MG tablet, Take 100 mg by mouth daily., Disp: , Rfl:    ondansetron (ZOFRAN) 8 MG tablet, Take 1 tablet (8 mg total) by mouth 2 (two) times daily as needed. Start on the third day after chemotherapy., Disp: 30 tablet, Rfl: 1   pantoprazole (PROTONIX) 20 MG tablet, Take 1 tablet (20 mg total) by mouth daily., Disp: 30 tablet, Rfl: 3   potassium chloride SA (KLOR-CON) 20 MEQ tablet, Take 1 tablet (20 mEq total) by mouth daily., Disp: 14 tablet, Rfl: 3   prochlorperazine (COMPAZINE) 10 MG tablet, Take 1 tablet (10 mg total) by mouth every 6 (six) hours as needed (Nausea or vomiting)., Disp: 30 tablet, Rfl: 1   timolol (TIMOPTIC) 0.5 % ophthalmic solution, Place 1 drop into both eyes daily. , Disp: , Rfl:    acetaminophen (TYLENOL) 500 MG tablet, Take 500 mg by mouth every 6 (six) hours as  needed., Disp: , Rfl:   Physical exam:  Vitals:   06/05/19 1108  BP: 133/84  Pulse: 79  Resp: 16  TempSrc: Tympanic  Weight: 179 lb 6.4 oz (81.4 kg)  Height: '5\' 6"'  (1.676 m)   Physical Exam Constitutional:      General: She is not in acute distress. HENT:     Head: Normocephalic and atraumatic.  Eyes:     Pupils: Pupils are equal, round, and reactive to light.  Cardiovascular:     Rate and Rhythm: Normal rate and regular rhythm.     Heart sounds: Normal heart sounds.  Pulmonary:     Effort: Pulmonary effort is normal.     Breath sounds: Normal breath sounds.  Abdominal:     General: Bowel sounds are normal.     Palpations: Abdomen is soft.  Musculoskeletal:     Cervical back: Normal range of motion.  Skin:    General: Skin is warm and dry.  Neurological:     Mental Status: She is alert and oriented to person, place, and time.      CMP Latest Ref Rng & Units 06/05/2019  Glucose 70 - 99 mg/dL 135(H)  BUN 8 - 23 mg/dL 31(H)  Creatinine 0.44 - 1.00 mg/dL 1.61(H)  Sodium 135 - 145 mmol/L 138  Potassium 3.5 - 5.1 mmol/L 3.1(L)  Chloride 98 - 111 mmol/L 104  CO2 22 - 32 mmol/L 23  Calcium 8.9 - 10.3 mg/dL 8.9  Total Protein 6.5 - 8.1 g/dL 8.0  Total Bilirubin 0.3 - 1.2 mg/dL 0.3  Alkaline Phos 38 - 126 U/L 86  AST 15 - 41 U/L 19  ALT 0 - 44 U/L 21   CBC Latest Ref Rng & Units 06/05/2019  WBC 4.0 - 10.5 K/uL 10.3  Hemoglobin 12.0 - 15.0 g/dL 11.0(L)  Hematocrit 36.0 - 46.0 % 33.7(L)  Platelets 150 - 400 K/uL 197    No images are attached to the encounter.  DG Chest 1 View  Result Date: 05/16/2019 CLINICAL DATA:  New diagnosis of left breast cancer. Status post power port insertion. EXAM: CHEST  1 VIEW COMPARISON:  None FINDINGS: Power port has been inserted. The tip is in good position in the superior vena cava approximately 3.5 cm below the carina. No pneumothorax. Heart size and vascularity are normal. Lungs are clear. No bone  abnormality. IMPRESSION: Power port  in good position. Electronically Signed   By: Lorriane Shire M.D.   On: 05/16/2019 11:16   NM Cardiac Muga Rest  Result Date: 05/15/2019 CLINICAL DATA:  Breast cancer, pre cardiotoxic chemotherapy EXAM: NUCLEAR MEDICINE CARDIAC BLOOD POOL IMAGING (MUGA) TECHNIQUE: Cardiac multi-gated acquisition was performed at rest following intravenous injection of Tc-42mlabeled red blood cells. RADIOPHARMACEUTICALS:  21.53 mCi [id number] in-vitro labeled red blood cells IV COMPARISON:  None FINDINGS: Calculated LEFT ventricular ejection fraction is 66%, normal. Study was obtained at a cardiac rate of 60 bpm. Patient was rhythmic during imaging. Cine analysis of the LEFT ventricle in 3 projections demonstrates normal LV wall motion. IMPRESSION: Normal LEFT ventricular ejection fraction of 66% with normal LV wall motion. Electronically Signed   By: MaLavonia Dana.D.   On: 05/15/2019 16:31   MR BREAST BILATERAL W WO CONTRAST INC CAD  Result Date: 05/21/2019 CLINICAL DATA:  [age]ear old female with newly diagnosed left breast cancer in January 2021. Patient has a history of remote left lumpectomy for cancer 20 years ago. LABS:  None performed on site. EXAM: BILATERAL BREAST MRI WITH AND WITHOUT CONTRAST TECHNIQUE: Multiplanar, multisequence MR images of both breasts were obtained prior to and following the intravenous administration of 7.5 ml of Gadavist. Three-dimensional MR images were rendered by post-processing of the original MR data on an independent workstation. The three-dimensional MR images were interpreted, and findings are reported in the following complete MRI report for this study. Three dimensional images were evaluated at the independent DynaCad workstation COMPARISON:  Previous exam(s). FINDINGS: Breast composition: c. Heterogeneous fibroglandular tissue. Background parenchymal enhancement: Mild on the left and moderate on the right. Right breast: Linear non mass enhancement is identified in the  lower inner quadrant of the right breast spanning from mid to posterior depth (series 6, image 31/112. This spans approximately 5.3 cm in the AP dimension. No additional dominant masses or suspicious enhancement within the remainder of the right breast. Left breast: Susceptibility artifact from post biopsy clip is seen in association with an irregular, enhancing mass in the lower inner quadrant of the left breast at middle depth (series 6, image 26/112). This is consistent with the patient's site of recently diagnosed malignancy. The mass measures 2.4 x 1.9 x 1.7 cm. The mass appears to tether and dimple the overlying skin, though no definite skin enhancement is seen. Two additional enhancing masses are identified within the superior left breast. This includes a 9 x 6 x 10 mm mass (series 6, image 76/112) and a 5 x 5 x 5 mm mass (series 6, image 68/112). Both masses demonstrate rapid uptake and suspicious washout kinetics. Lymph nodes: There is no suspicious left axillary or internal mammary chain lymphadenopathy. However, note is made of several prominent right axillary lymph nodes demonstrating up to 8-9 mm of cortical thickness. Ancillary findings:  None. IMPRESSION: 1. 2.4 cm mass consistent with the patient's biopsy-proven site of malignancy in the lower inner left breast. 2. Two additional suspicious masses in the superior left breast measuring 9 and 5 mm. Recommendation is for second-look ultrasound with subsequent biopsy to follow. If ultrasound correlates cannot be identified, MRI guided biopsy is recommended. 3. Suspicious linear enhancement spanning 5 cm in the lower inner quadrant of the right breast. Recommendation is for MRI guided biopsy. 4. Prominent right axillary lymph nodes demonstrating 8-9 mm cortical thickness. Recommendation is for second-look ultrasound with subsequent biopsy as indicated. RECOMMENDATION: 1. Recommendation is that the patient first  return for bilateral second-look ultrasound  and possible ultrasound-guided biopsies. This should include the superior left breast for further evaluation of 2 possible masses measuring 9 and 5 mm (series 6, image 76 and 68/112) as well as the right axilla for prominent lymph nodes. 2. Pending above results, the patient may require additional MRI guided biopsy of 1 or both of the superior left breast masses. 3. Additionally, the patient will require MRI guided biopsy of suspicious linear enhancement in the medial right breast. Biopsies of both the anterior and posterior margin should be performed. This will likely require a medial approach and would require a separate visit from left-sided MRI guided biopsy (if they are to be performed). BI-RADS CATEGORY  4: Suspicious. Electronically Signed   By: Kristopher Oppenheim M.D.   On: 05/21/2019 12:46   DG C-Arm 1-60 Min-No Report  Result Date: 05/16/2019 Fluoroscopy was utilized by the requesting physician.  No radiographic interpretation.   US BREAST LTD UNI LEFT INC AXILLA  Addendum Date: 05/30/2019   ADDENDUM REPORT: 05/30/2019 10:25 ADDENDUM: The larger mass at 11:00 measuring 1.1 cm is labeled 10 cmfn. The smaller mass measuring 0.6 cm is labeled 9 cmfm. Electronically Signed   By: Audie Pinto M.D.   On: 05/30/2019 10:25   Result Date: 05/30/2019 CLINICAL DATA:  64 year old female with newly diagnosed left breast cancer in January 2021. Patient has a history of remote left lumpectomy for cancer 20 years ago. Patient presents for second-look ultrasound of the left breast and right axilla. EXAM: ULTRASOUND OF THE BILATERAL BREAST COMPARISON:  Previous exam(s). FINDINGS: Left breast: Targeted ultrasound is performed in the left breast at 11 o'clock 9 cm from nipple demonstrating two adjacent oval hypoechoic masses with indistinct margins, the larger measuring 1.0 x 0.4 x 1.1 cm, with internal blood flow. The smaller of the 2 masses located approximately 1 cm away measures 0.4 x 0.2 x 0.6 cm. These masses  correspond to the findings identified on recent MR in the upper inner left breast. Right axilla: Targeted ultrasound the right axilla demonstrates two lymph nodes with borderline cortical thickening of 0.4 cm. There is retention of the fatty hilum in both. This corresponds to the findings seen on recent MRI. IMPRESSION: 1. At 11 o'clock in the left breast there are two indeterminate masses measuring 1.0 cm and 0.6 cm, which correspond to the suspicious findings on recent MRI. 2. There are two lymph nodes with borderline cortical thickening in the right axilla. RECOMMENDATION: 1. Ultrasound-guided core needle biopsy x 2 of the adjacent masses in the left breast at 11 o'clock. 2. Ultrasound-guided core needle biopsy of a right axillary lymph node with borderline cortical thickening. The biopsies will be performed same day. I have discussed the findings and recommendations with the patient. BI-RADS CATEGORY  4: Suspicious. Electronically Signed: By: Audie Pinto M.D. On: 05/30/2019 09:42   US BREAST LTD UNI RIGHT INC AXILLA  Addendum Date: 05/30/2019   ADDENDUM REPORT: 05/30/2019 10:25 ADDENDUM: The larger mass at 11:00 measuring 1.1 cm is labeled 10 cmfn. The smaller mass measuring 0.6 cm is labeled 9 cmfm. Electronically Signed   By: Audie Pinto M.D.   On: 05/30/2019 10:25   Result Date: 05/30/2019 CLINICAL DATA:  64 year old female with newly diagnosed left breast cancer in January 2021. Patient has a history of remote left lumpectomy for cancer 20 years ago. Patient presents for second-look ultrasound of the left breast and right axilla. EXAM: ULTRASOUND OF THE BILATERAL BREAST COMPARISON:  Previous  exam(s). FINDINGS: Left breast: Targeted ultrasound is performed in the left breast at 11 o'clock 9 cm from nipple demonstrating two adjacent oval hypoechoic masses with indistinct margins, the larger measuring 1.0 x 0.4 x 1.1 cm, with internal blood flow. The smaller of the 2 masses located  approximately 1 cm away measures 0.4 x 0.2 x 0.6 cm. These masses correspond to the findings identified on recent MR in the upper inner left breast. Right axilla: Targeted ultrasound the right axilla demonstrates two lymph nodes with borderline cortical thickening of 0.4 cm. There is retention of the fatty hilum in both. This corresponds to the findings seen on recent MRI. IMPRESSION: 1. At 11 o'clock in the left breast there are two indeterminate masses measuring 1.0 cm and 0.6 cm, which correspond to the suspicious findings on recent MRI. 2. There are two lymph nodes with borderline cortical thickening in the right axilla. RECOMMENDATION: 1. Ultrasound-guided core needle biopsy x 2 of the adjacent masses in the left breast at 11 o'clock. 2. Ultrasound-guided core needle biopsy of a right axillary lymph node with borderline cortical thickening. The biopsies will be performed same day. I have discussed the findings and recommendations with the patient. BI-RADS CATEGORY  4: Suspicious. Electronically Signed: By: Audie Pinto M.D. On: 05/30/2019 09:42   MM CLIP PLACEMENT LEFT  Result Date: 05/30/2019 CLINICAL DATA:  65 year old female with remote history of left breast cancer status post lumpectomy. New left breast cancer diagnosis with recent MRI demonstrating two additional masses in the left breast as well as indeterminate right axillary lymph nodes. Patient underwent ultrasound-guided core needle biopsy of the 2 masses in the left superior breast as well as of a right axillary lymph node. EXAM: DIAGNOSTIC BILATERAL MAMMOGRAM POST ULTRASOUND BIOPSY COMPARISON:  Previous exam(s). FINDINGS: Mammographic images were obtained following ultrasound guided biopsy of a left breast mass at 11 o'clock 9 cm from the nipple. The ribbon biopsy marking clip is in expected position at the site of biopsy. Mammographic images were obtained following ultrasound guided biopsy of left breast mass at 11 o'clock 10 cm from the  nipple. The heart biopsy marking clip is in expected position at the site of biopsy. Mammographic images were obtained following ultrasound guided biopsy of a right axillary lymph node. The biopsy marking clip was identified at the site of biopsy sonographically. A biopsy marking clip is possibly seen in the right axilla just posterior to the patient's Port-A-Cath. IMPRESSION: Appropriate positioning of the ribbon shaped biopsy marking clip at the site of biopsy in the left breast at 11 o'clock 9 cm from the nipple. Appropriate positioning of the heart shaped biopsy marking clip at the site of biopsy in the left breast at 11 o'clock 10 cm from the nipple. Appropriate positioning of the [facility] shaped biopsy marking clip at the site of biopsy in the right axilla. Final Assessment: Post Procedure Mammograms for Marker Placement Electronically Signed   By: Audie Pinto M.D.   On: 05/30/2019 10:49   MM CLIP PLACEMENT RIGHT  Result Date: 05/30/2019 CLINICAL DATA:  64 year old female with remote history of left breast cancer status post lumpectomy. New left breast cancer diagnosis with recent MRI demonstrating two additional masses in the left breast as well as indeterminate right axillary lymph nodes. Patient underwent ultrasound-guided core needle biopsy of the 2 masses in the left superior breast as well as of a right axillary lymph node. EXAM: DIAGNOSTIC BILATERAL MAMMOGRAM POST ULTRASOUND BIOPSY COMPARISON:  Previous exam(s). FINDINGS: Mammographic images were  obtained following ultrasound guided biopsy of a left breast mass at 11 o'clock 9 cm from the nipple. The ribbon biopsy marking clip is in expected position at the site of biopsy. Mammographic images were obtained following ultrasound guided biopsy of left breast mass at 11 o'clock 10 cm from the nipple. The heart biopsy marking clip is in expected position at the site of biopsy. Mammographic images were obtained following ultrasound guided biopsy of  a right axillary lymph node. The biopsy marking clip was identified at the site of biopsy sonographically. A biopsy marking clip is possibly seen in the right axilla just posterior to the patient's Port-A-Cath. IMPRESSION: Appropriate positioning of the ribbon shaped biopsy marking clip at the site of biopsy in the left breast at 11 o'clock 9 cm from the nipple. Appropriate positioning of the heart shaped biopsy marking clip at the site of biopsy in the left breast at 11 o'clock 10 cm from the nipple. Appropriate positioning of the [facility] shaped biopsy marking clip at the site of biopsy in the right axilla. Final Assessment: Post Procedure Mammograms for Marker Placement Electronically Signed   By: Audie Pinto M.D.   On: 05/30/2019 10:49   [place] LT BREAST BX W LOC DEV 1ST LESION IMG BX SPEC US GUIDE  Addendum Date: 06/04/2019   ADDENDUM REPORT: 06/04/2019 15:32 ADDENDUM: PATHOLOGY revealed: A. LEFT BREAST, 11:00 9 CMFN; ULTRASOUND-GUIDED BIOPSY: - INVASIVE MAMMARY CARCINOMA, AS DESCRIBED BELOW (SEE PART B). B. LEFT BREAST, 11:00 10 CMFN; ULTRASOUND-GUIDED BIOPSY: - INVASIVE MAMMARY CARCINOMA, NO SPECIAL TYPE. 7 mm in this sample. Grade 3. Ductal carcinoma in situ: not identified. Lymphovascular invasion: Not identified. Comment: The tumor is compared to that in the previous biopsy case [id number] and is morphologically similar. C. AXILLARY LYMPH NODE, RIGHT; ULTRASOUND-GUIDED BIOPSY: - BENIGN LYMPH NODE. - NEGATIVE FOR METASTATIC CARCINOMA. Pathology results are CONCORDANT with imaging findings, per Dr. Audie Pinto. Multiple attempts to contact patient were unsuccessful. Notified patient's provider office, and spoke with Jarrett Soho RN requesting they contact patient with biopsy results. Hassan Rowan stated they will inform patient of her biopsy results and recommendations at patient's next appointment with Dr. Janese Banks Thursday (06/05/2019). Recommendation: 1. Surgical referral. Request for surgical  referral was relayed to nurse navigators at [facility] by Electa Sniff RN on 06/03/2019. 2. MRI guided biopsy for non-mass enhancement of the RIGHT breast before proceeding to surgical management. Jarrett Soho RN will inform Dr. Janese Banks of MRI recommendation. Addendum by Electa Sniff RN on 06/04/2019. Electronically Signed   By: Audie Pinto M.D.   On: 06/04/2019 15:32   Result Date: 06/04/2019 CLINICAL DATA:  64 year old female with history of remote left breast cancer status post lumpectomy. Patient has newly diagnosed left breast cancer with two additional masses detected on MR in the left breast as well as an indeterminate right axillary lymph node. EXAM: ULTRASOUND GUIDED LEFT BREAST CORE NEEDLE BIOPSY ULTRASOUND GUIDED LEFT BREAST CORE NEEDLE BIOPSY ULTRASOUND GUIDED RIGHT AXILLARY CORE NEEDLE BIOPSY COMPARISON:  Previous exam(s). FINDINGS: I met with the patient and we discussed the procedure of ultrasound-guided biopsy, including benefits and alternatives. We discussed the high likelihood of a successful procedure. We discussed the risks of the procedure, including infection, bleeding, tissue injury, clip migration, and inadequate sampling. Informed written consent was given. The usual time-out protocol was performed immediately prior to the procedure. 1.  Lesion quadrant: Upper inner quadrant Using sterile technique and 1% Lidocaine as local anesthetic, under direct ultrasound visualization, a 14 gauge spring-loaded  device was used to perform biopsy of a left breast mass at 11 o'clock 9 cm from the nipple using a medial approach. At the conclusion of the procedure a ribbon tissue marker clip was deployed into the biopsy cavity. Follow up 2 view mammogram was performed and dictated separately. 2.  Lesion quadrant: Upper inner quadrant Using sterile technique and 1% Lidocaine as local anesthetic, under direct ultrasound visualization, a 14 gauge spring-loaded device was used to perform  biopsy of a left breast mass at 11 o'clock 10 cm from the nipple using a medial approach. At the conclusion of the procedure a heart tissue marker clip was deployed into the biopsy cavity. Follow up 2 view mammogram was performed and dictated separately. 3. Axilla Using sterile technique and 1% Lidocaine as local anesthetic, under direct ultrasound visualization, a 14 gauge spring-loaded device was used to perform biopsy of a right axillary lymph node using a inferior approach. At the conclusion of the procedure a HydroMARK tissue marker clip was deployed into the biopsy cavity. Follow up 2 view mammogram was performed and dictated separately. IMPRESSION: Ultrasound guided biopsy of a left breast mass at 11 o'clock 9 cm from nipple, a left breast mass at 11 o'clock 10 cm from nipple, and a right axillary lymph node. No apparent complications. Electronically Signed: By: Audie Pinto M.D. On: 05/30/2019 10:56   [place] LT BREAST BX W LOC DEV EA ADD LESION IMG BX SPEC US GUIDE  Addendum Date: 06/04/2019   ADDENDUM REPORT: 06/04/2019 15:32 ADDENDUM: PATHOLOGY revealed: A. LEFT BREAST, 11:00 9 CMFN; ULTRASOUND-GUIDED BIOPSY: - INVASIVE MAMMARY CARCINOMA, AS DESCRIBED BELOW (SEE PART B). B. LEFT BREAST, 11:00 10 CMFN; ULTRASOUND-GUIDED BIOPSY: - INVASIVE MAMMARY CARCINOMA, NO SPECIAL TYPE. 7 mm in this sample. Grade 3. Ductal carcinoma in situ: not identified. Lymphovascular invasion: Not identified. Comment: The tumor is compared to that in the previous biopsy case [id number] and is morphologically similar. C. AXILLARY LYMPH NODE, RIGHT; ULTRASOUND-GUIDED BIOPSY: - BENIGN LYMPH NODE. - NEGATIVE FOR METASTATIC CARCINOMA. Pathology results are CONCORDANT with imaging findings, per Dr. Audie Pinto. Multiple attempts to contact patient were unsuccessful. Notified patient's provider office, and spoke with Jarrett Soho RN requesting they contact patient with biopsy results. Hassan Rowan stated they will inform patient  of her biopsy results and recommendations at patient's next appointment with Dr. Janese Banks Thursday (06/05/2019). Recommendation: 1. Surgical referral. Request for surgical referral was relayed to nurse navigators at [facility] by Electa Sniff RN on 06/03/2019. 2. MRI guided biopsy for non-mass enhancement of the RIGHT breast before proceeding to surgical management. Jarrett Soho RN will inform Dr. Janese Banks of MRI recommendation. Addendum by Electa Sniff RN on 06/04/2019. Electronically Signed   By: Audie Pinto M.D.   On: 06/04/2019 15:32   Result Date: 06/04/2019 CLINICAL DATA:  64 year old female with history of remote left breast cancer status post lumpectomy. Patient has newly diagnosed left breast cancer with two additional masses detected on MR in the left breast as well as an indeterminate right axillary lymph node. EXAM: ULTRASOUND GUIDED LEFT BREAST CORE NEEDLE BIOPSY ULTRASOUND GUIDED LEFT BREAST CORE NEEDLE BIOPSY ULTRASOUND GUIDED RIGHT AXILLARY CORE NEEDLE BIOPSY COMPARISON:  Previous exam(s). FINDINGS: I met with the patient and we discussed the procedure of ultrasound-guided biopsy, including benefits and alternatives. We discussed the high likelihood of a successful procedure. We discussed the risks of the procedure, including infection, bleeding, tissue injury, clip migration, and inadequate sampling. Informed written consent was given. The usual time-out protocol  was performed immediately prior to the procedure. 1.  Lesion quadrant: Upper inner quadrant Using sterile technique and 1% Lidocaine as local anesthetic, under direct ultrasound visualization, a 14 gauge spring-loaded device was used to perform biopsy of a left breast mass at 11 o'clock 9 cm from the nipple using a medial approach. At the conclusion of the procedure a ribbon tissue marker clip was deployed into the biopsy cavity. Follow up 2 view mammogram was performed and dictated separately. 2.  Lesion quadrant: Upper  inner quadrant Using sterile technique and 1% Lidocaine as local anesthetic, under direct ultrasound visualization, a 14 gauge spring-loaded device was used to perform biopsy of a left breast mass at 11 o'clock 10 cm from the nipple using a medial approach. At the conclusion of the procedure a heart tissue marker clip was deployed into the biopsy cavity. Follow up 2 view mammogram was performed and dictated separately. 3. Axilla Using sterile technique and 1% Lidocaine as local anesthetic, under direct ultrasound visualization, a 14 gauge spring-loaded device was used to perform biopsy of a right axillary lymph node using a inferior approach. At the conclusion of the procedure a HydroMARK tissue marker clip was deployed into the biopsy cavity. Follow up 2 view mammogram was performed and dictated separately. IMPRESSION: Ultrasound guided biopsy of a left breast mass at 11 o'clock 9 cm from nipple, a left breast mass at 11 o'clock 10 cm from nipple, and a right axillary lymph node. No apparent complications. Electronically Signed: By: Audie Pinto M.D. On: 05/30/2019 10:56   [place] RT BREAST BX W LOC DEV 1ST LESION IMG BX SPEC US GUIDE  Addendum Date: 06/04/2019   ADDENDUM REPORT: 06/04/2019 15:32 ADDENDUM: PATHOLOGY revealed: A. LEFT BREAST, 11:00 9 CMFN; ULTRASOUND-GUIDED BIOPSY: - INVASIVE MAMMARY CARCINOMA, AS DESCRIBED BELOW (SEE PART B). B. LEFT BREAST, 11:00 10 CMFN; ULTRASOUND-GUIDED BIOPSY: - INVASIVE MAMMARY CARCINOMA, NO SPECIAL TYPE. 7 mm in this sample. Grade 3. Ductal carcinoma in situ: not identified. Lymphovascular invasion: Not identified. Comment: The tumor is compared to that in the previous biopsy case [id number] and is morphologically similar. C. AXILLARY LYMPH NODE, RIGHT; ULTRASOUND-GUIDED BIOPSY: - BENIGN LYMPH NODE. - NEGATIVE FOR METASTATIC CARCINOMA. Pathology results are CONCORDANT with imaging findings, per Dr. Audie Pinto. Multiple attempts to contact patient were unsuccessful.  Notified patient's provider office, and spoke with Jarrett Soho RN requesting they contact patient with biopsy results. Hassan Rowan stated they will inform patient of her biopsy results and recommendations at patient's next appointment with Dr. Janese Banks Thursday (06/05/2019). Recommendation: 1. Surgical referral. Request for surgical referral was relayed to nurse navigators at [facility] by Electa Sniff RN on 06/03/2019. 2. MRI guided biopsy for non-mass enhancement of the RIGHT breast before proceeding to surgical management. Jarrett Soho RN will inform Dr. Janese Banks of MRI recommendation. Addendum by Electa Sniff RN on 06/04/2019. Electronically Signed   By: Audie Pinto M.D.   On: 06/04/2019 15:32   Result Date: 06/04/2019 CLINICAL DATA:  64 year old female with history of remote left breast cancer status post lumpectomy. Patient has newly diagnosed left breast cancer with two additional masses detected on MR in the left breast as well as an indeterminate right axillary lymph node. EXAM: ULTRASOUND GUIDED LEFT BREAST CORE NEEDLE BIOPSY ULTRASOUND GUIDED LEFT BREAST CORE NEEDLE BIOPSY ULTRASOUND GUIDED RIGHT AXILLARY CORE NEEDLE BIOPSY COMPARISON:  Previous exam(s). FINDINGS: I met with the patient and we discussed the procedure of ultrasound-guided biopsy, including benefits and alternatives. We discussed the high likelihood  of a successful procedure. We discussed the risks of the procedure, including infection, bleeding, tissue injury, clip migration, and inadequate sampling. Informed written consent was given. The usual time-out protocol was performed immediately prior to the procedure. 1.  Lesion quadrant: Upper inner quadrant Using sterile technique and 1% Lidocaine as local anesthetic, under direct ultrasound visualization, a 14 gauge spring-loaded device was used to perform biopsy of a left breast mass at 11 o'clock 9 cm from the nipple using a medial approach. At the conclusion of the  procedure a ribbon tissue marker clip was deployed into the biopsy cavity. Follow up 2 view mammogram was performed and dictated separately. 2.  Lesion quadrant: Upper inner quadrant Using sterile technique and 1% Lidocaine as local anesthetic, under direct ultrasound visualization, a 14 gauge spring-loaded device was used to perform biopsy of a left breast mass at 11 o'clock 10 cm from the nipple using a medial approach. At the conclusion of the procedure a heart tissue marker clip was deployed into the biopsy cavity. Follow up 2 view mammogram was performed and dictated separately. 3. Axilla Using sterile technique and 1% Lidocaine as local anesthetic, under direct ultrasound visualization, a 14 gauge spring-loaded device was used to perform biopsy of a right axillary lymph node using a inferior approach. At the conclusion of the procedure a HydroMARK tissue marker clip was deployed into the biopsy cavity. Follow up 2 view mammogram was performed and dictated separately. IMPRESSION: Ultrasound guided biopsy of a left breast mass at 11 o'clock 9 cm from nipple, a left breast mass at 11 o'clock 10 cm from nipple, and a right axillary lymph node. No apparent complications. Electronically Signed: By: Audie Pinto M.D. On: 05/30/2019 10:56     Assessment and plan- Patient is a 64 y.o. 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   She is here for on treatment assessment prior to cycle 2 of dose dense AC chemotherapy  I discussed the biopsy results with the patient in detail.  In addition to her primary breast mass patient also had 2 additional lesions in her right breast both of which were biopsy-proven triple negative breast cancer.  In view of her additional disease I would like to add carboplatin to Taxol after she completes dose dense AC neoadjuvant chemotherapy.  She also had a right axillary lymph node biopsy for suspicious  findings on MRI but that lymph node was negative for malignancy. She now has to undergo a fourth biopsy of the non-mass enhancement in the right breast which we will coordinate.  Counts are otherwise okay to proceed with cycle 2 of dose dense AC chemotherapy today with growth factor support.  I will see her back in 2 weeks for cycle 3.  Serum creatinine is elevated at 1.6 likely secondary to poor oral intake.  I will give her 1 L of IV fluids today  Hypokalemia: We will give 20 mEq of IV potassium with IV fluids today in she will start on oral potassium 20 mEq daily for 2 weeks   Visit Diagnosis 1. Encounter for antineoplastic chemotherapy   2. Malignant neoplasm of lower-inner quadrant of left breast in female, estrogen receptor negative ([place])   3. AKI (acute kidney injury) ([place])   4. Hypokalemia      Dr. Randa Evens, MD, MPH [facility] at [facility] [phone] 06/06/2019 12:41 PM

## 2019-06-10 ENCOUNTER — Telehealth: Payer: Self-pay | Admitting: *Deleted

## 2019-06-10 NOTE — Telephone Encounter (Signed)
I let pt know that her fmla is done. I called and spoke to Nicole Kindred at [place] and he gave me the fax # [phone]

## 2019-06-11 ENCOUNTER — Telehealth: Payer: Self-pay | Admitting: *Deleted

## 2019-06-11 NOTE — Telephone Encounter (Signed)
Called pt to see how she is feeling from chemotherapy. She said that she had one spell of nausea this am and took pill and she feels fine now. She is drinking and eating good. She does not have any aches or pains from udenyca.

## 2019-06-18 ENCOUNTER — Other Ambulatory Visit: Payer: Self-pay | Admitting: Oncology

## 2019-06-18 ENCOUNTER — Encounter: Payer: Self-pay | Admitting: Oncology

## 2019-06-18 ENCOUNTER — Other Ambulatory Visit: Payer: Self-pay

## 2019-06-18 DIAGNOSIS — N63 Unspecified lump in unspecified breast: Secondary | ICD-10-CM

## 2019-06-18 NOTE — Progress Notes (Signed)
Patient stated that she had one spell of being nauseous a day after her chemotherapy but she stated that it could of been because she took her Decadron wrong and maybe it caused her to feel this way. Ever since then, she had been fine with no complaints. Patient will have a breast biopsy on 06/26/2019.

## 2019-06-19 ENCOUNTER — Inpatient Hospital Stay: Payer: BC Managed Care – PPO | Attending: Oncology

## 2019-06-19 ENCOUNTER — Other Ambulatory Visit: Payer: Self-pay

## 2019-06-19 ENCOUNTER — Inpatient Hospital Stay: Payer: BC Managed Care – PPO

## 2019-06-19 ENCOUNTER — Inpatient Hospital Stay (HOSPITAL_BASED_OUTPATIENT_CLINIC_OR_DEPARTMENT_OTHER): Payer: BC Managed Care – PPO | Admitting: Oncology

## 2019-06-19 ENCOUNTER — Encounter: Payer: Self-pay | Admitting: *Deleted

## 2019-06-19 VITALS — BP 133/71 | HR 63 | Temp 97.2°F | Resp 18 | Wt 174.9 lb

## 2019-06-19 DIAGNOSIS — R110 Nausea: Secondary | ICD-10-CM | POA: Diagnosis not present

## 2019-06-19 DIAGNOSIS — C50312 Malignant neoplasm of lower-inner quadrant of left female breast: Secondary | ICD-10-CM | POA: Insufficient documentation

## 2019-06-19 DIAGNOSIS — E876 Hypokalemia: Secondary | ICD-10-CM

## 2019-06-19 DIAGNOSIS — Z5111 Encounter for antineoplastic chemotherapy: Secondary | ICD-10-CM

## 2019-06-19 DIAGNOSIS — Z171 Estrogen receptor negative status [ER-]: Secondary | ICD-10-CM

## 2019-06-19 DIAGNOSIS — R5383 Other fatigue: Secondary | ICD-10-CM | POA: Diagnosis not present

## 2019-06-19 DIAGNOSIS — Z87891 Personal history of nicotine dependence: Secondary | ICD-10-CM | POA: Insufficient documentation

## 2019-06-19 DIAGNOSIS — Z7952 Long term (current) use of systemic steroids: Secondary | ICD-10-CM | POA: Insufficient documentation

## 2019-06-19 DIAGNOSIS — Z923 Personal history of irradiation: Secondary | ICD-10-CM | POA: Insufficient documentation

## 2019-06-19 DIAGNOSIS — R5381 Other malaise: Secondary | ICD-10-CM | POA: Insufficient documentation

## 2019-06-19 DIAGNOSIS — I10 Essential (primary) hypertension: Secondary | ICD-10-CM | POA: Insufficient documentation

## 2019-06-19 DIAGNOSIS — Z853 Personal history of malignant neoplasm of breast: Secondary | ICD-10-CM | POA: Insufficient documentation

## 2019-06-19 DIAGNOSIS — Z79899 Other long term (current) drug therapy: Secondary | ICD-10-CM | POA: Insufficient documentation

## 2019-06-19 DIAGNOSIS — N179 Acute kidney failure, unspecified: Secondary | ICD-10-CM | POA: Diagnosis not present

## 2019-06-19 LAB — CBC WITH DIFFERENTIAL/PLATELET
Abs Immature Granulocytes: 1.1 10*3/uL — ABNORMAL HIGH (ref 0.00–0.07)
Band Neutrophils: 8 %
Basophils Absolute: 0 10*3/uL (ref 0.0–0.1)
Basophils Relative: 0 %
Eosinophils Absolute: 0 10*3/uL (ref 0.0–0.5)
Eosinophils Relative: 0 %
HCT: 30.7 % — ABNORMAL LOW (ref 36.0–46.0)
Hemoglobin: 10 g/dL — ABNORMAL LOW (ref 12.0–15.0)
Lymphocytes Relative: 8 %
Lymphs Abs: 1.1 10*3/uL (ref 0.7–4.0)
MCH: 27.3 pg (ref 26.0–34.0)
MCHC: 32.6 g/dL (ref 30.0–36.0)
MCV: 83.9 fL (ref 80.0–100.0)
Metamyelocytes Relative: 5 %
Monocytes Absolute: 1 10*3/uL (ref 0.1–1.0)
Monocytes Relative: 7 %
Myelocytes: 3 %
Neutro Abs: 10.5 10*3/uL — ABNORMAL HIGH (ref 1.7–7.7)
Neutrophils Relative %: 69 %
Platelets: 191 10*3/uL (ref 150–400)
RBC: 3.66 MIL/uL — ABNORMAL LOW (ref 3.87–5.11)
RDW: 12.4 % (ref 11.5–15.5)
Smear Review: NORMAL
WBC Morphology: 19
WBC: 13.7 10*3/uL — ABNORMAL HIGH (ref 4.0–10.5)
nRBC: 0.5 % — ABNORMAL HIGH (ref 0.0–0.2)

## 2019-06-19 LAB — COMPREHENSIVE METABOLIC PANEL
ALT: 23 U/L (ref 0–44)
AST: 19 U/L (ref 15–41)
Albumin: 3.7 g/dL (ref 3.5–5.0)
Alkaline Phosphatase: 100 U/L (ref 38–126)
Anion gap: 11 (ref 5–15)
BUN: 18 mg/dL (ref 8–23)
CO2: 22 mmol/L (ref 22–32)
Calcium: 8.8 mg/dL — ABNORMAL LOW (ref 8.9–10.3)
Chloride: 104 mmol/L (ref 98–111)
Creatinine, Ser: 1.1 mg/dL — ABNORMAL HIGH (ref 0.44–1.00)
GFR calc Af Amer: >60 mL/min (ref >60)
GFR calc non Af Amer: 54 mL/min — ABNORMAL LOW (ref >60)
Glucose, Bld: 111 mg/dL — ABNORMAL HIGH (ref 70–99)
Potassium: 2.8 mmol/L — ABNORMAL LOW (ref 3.5–5.1)
Sodium: 137 mmol/L (ref 135–145)
Total Bilirubin: 0.3 mg/dL (ref 0.3–1.2)
Total Protein: 7.1 g/dL (ref 6.5–8.1)

## 2019-06-19 MED ORDER — HEPARIN SOD (PORK) LOCK FLUSH 100 UNIT/ML IV SOLN
500.0000 [IU] | Freq: Once | INTRAVENOUS | Status: AC
  Administered 2019-06-19: 13:00:00 500 [IU] via INTRAVENOUS
  Filled 2019-06-19: qty 5

## 2019-06-19 MED ORDER — HEPARIN SOD (PORK) LOCK FLUSH 100 UNIT/ML IV SOLN
500.0000 [IU] | Freq: Once | INTRAVENOUS | Status: DC | PRN
  Filled 2019-06-19: qty 5

## 2019-06-19 MED ORDER — SODIUM CHLORIDE 0.9 % IV SOLN
150.0000 mg | Freq: Once | INTRAVENOUS | Status: AC
  Administered 2019-06-19: 150 mg via INTRAVENOUS
  Filled 2019-06-19: qty 5

## 2019-06-19 MED ORDER — SODIUM CHLORIDE 0.9% FLUSH
10.0000 mL | INTRAVENOUS | Status: DC | PRN
  Administered 2019-06-19: 10 mL via INTRAVENOUS
  Filled 2019-06-19: qty 10

## 2019-06-19 MED ORDER — SODIUM CHLORIDE 0.9 % IV SOLN
600.0000 mg/m2 | Freq: Once | INTRAVENOUS | Status: AC
  Administered 2019-06-19: 1120 mg via INTRAVENOUS
  Filled 2019-06-19: qty 50

## 2019-06-19 MED ORDER — SODIUM CHLORIDE 0.9 % IV SOLN
10.0000 mg | Freq: Once | INTRAVENOUS | Status: AC
  Administered 2019-06-19: 10 mg via INTRAVENOUS
  Filled 2019-06-19: qty 10

## 2019-06-19 MED ORDER — DOXORUBICIN HCL CHEMO IV INJECTION 2 MG/ML
60.0000 mg/m2 | Freq: Once | INTRAVENOUS | Status: AC
  Administered 2019-06-19: 12:00:00 112 mg via INTRAVENOUS
  Filled 2019-06-19: qty 50

## 2019-06-19 MED ORDER — PALONOSETRON HCL INJECTION 0.25 MG/5ML
0.2500 mg | Freq: Once | INTRAVENOUS | Status: AC
  Administered 2019-06-19: 0.25 mg via INTRAVENOUS
  Filled 2019-06-19: qty 5

## 2019-06-19 MED ORDER — SODIUM CHLORIDE 0.9 % IV SOLN
Freq: Once | INTRAVENOUS | Status: AC
  Filled 2019-06-19: qty 1000

## 2019-06-19 MED ORDER — SODIUM CHLORIDE 0.9 % IV SOLN
Freq: Once | INTRAVENOUS | Status: AC
  Filled 2019-06-19: qty 250

## 2019-06-19 MED ORDER — POTASSIUM CHLORIDE CRYS ER 20 MEQ PO TBCR: 20.0000 meq | EXTENDED_RELEASE_TABLET | Freq: Two times a day (BID) | ORAL | 3 refills | Status: DC

## 2019-06-19 MED ORDER — HEPARIN SOD (PORK) LOCK FLUSH 100 UNIT/ML IV SOLN
INTRAVENOUS | Status: AC
  Filled 2019-06-19: qty 5

## 2019-06-19 NOTE — Progress Notes (Addendum)
Hematology/Oncology Consult note [facility]  [phone] [phone]  Patient Care Team: Lanny Hurst, MD as PCP - General (Internal Medicine) Rico Junker, RN as Registered Nurse Theodore Demark, RN as Registered Nurse   Name of the patient: Molly Walters  [id number]  04-Sep-1955   Date of visit: 06/19/19  Diagnosis-  clinical prognostic stage Ia invasive mammary carcinoma cmT1 ccN0 cM0 ER/PR negative and HER-2/neu negative   Chief complaint/ Reason for visit-on treatment assessment prior to cycle 3 of dose dense AC chemotherapy  Heme/Onc history: patient is a 64 year old African-American female with past medical history significant for hypertension. She has had prior breast cancer in 2001 s/p lumpectomy followed by adjuvant radiation treatment. She did not require adjuvant chemotherapy. She does not remember taking any endocrine therapy as well.More recently patient underwent a bilateral screening mammogram in November 2020 which showed a possible mass in the left breast. This was followed by a diagnostic mammogram and ultrasound which showed a 1.6 cm mass at the 7 o'clock position in the left breast near the lumpectomy scar. No left axillary adenopathy. 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 Patient has seen Dr. Adora Fridge and referred to [place] for further management.Patient is independent of her ADLs and IADLs. Family history significant for prostate cancer in her maternal uncle. No other family history of breast, ovarian pancreatic colon cancer or melanoma.  MRI bilateral breast showed:The size of the primary left breast mass appears larger on MRI close to 2.4 x 1.9 x 1.7 cm. She is also noted to have 2 additional enhancing masses 9 x 6 x 10 mm and 5 x 5 x 5 mm in the left breast with suspicious washout kinetics. No pathological left axillary adenopathy.  Prominent lymph nodes seen on the right side along with non-mass enhancement spanning 5.3 cm in the AP dimension. Both the enhancing masses in the left breast were positive for triple negative breast cancer.  Right sided axillary lymph node was negative for malignancy.  Patient is currently awaiting right breast biopsy   Interval history-still has a cast around right thigh and will be seeing orthopedics soon.  She is able to ambulate independently but requires more effort.  Has not had any falls.  Reports nausea for 1 day that was self-limited.  Denies any diarrhea.  ECOG PS- 1 Pain scale- 0 Opioid associated constipation- no  Review of systems- Review of Systems  Constitutional: Positive for malaise/fatigue. Negative for chills, fever and weight loss.  HENT: Negative for congestion, ear discharge and nosebleeds.   Eyes: Negative for blurred vision.  Respiratory: Negative for cough, hemoptysis, sputum production, shortness of breath and wheezing.   Cardiovascular: Negative for chest pain, palpitations, orthopnea and claudication.  Gastrointestinal: Negative for abdominal pain, blood in stool, constipation, diarrhea, heartburn, melena, nausea and vomiting.  Genitourinary: Negative for dysuria, flank pain, frequency, hematuria and urgency.  Musculoskeletal: Negative for back pain, joint pain and myalgias.  Skin: Negative for rash.  Neurological: Negative for dizziness, tingling, focal weakness, seizures, weakness and headaches.  Endo/Heme/Allergies: Does not bruise/bleed easily.  Psychiatric/Behavioral: Negative for depression and suicidal ideas. The patient does not have insomnia.       No Known Allergies   Past Medical History:  Diagnosis Date   Breast cancer ([place]) 2001   left breast lumpectomy and rad tx   Breast cancer ([place]) 02/2019   left breast mass w/ biopsy  Hypertension    Personal history of radiation therapy 2001   BREAST CA     Past Surgical History:    Procedure Laterality Date   ABDOMINAL HYSTERECTOMY     BREAST BIOPSY Left 2001   POS   BREAST BIOPSY Left 2021   positive   BREAST LUMPECTOMY Left 2001   PORTACATH PLACEMENT Right 05/16/2019   Procedure: INSERTION PORT-A-CATH;  Surgeon: Jules Husbands, MD;  Location: [facility] ORS;  Service: General;  Laterality: Right;    Social History   Socioeconomic History   Marital status: Divorced    Spouse name: Not on file   Number of children: Not on file   Years of education: Not on file   Highest education level: Not on file  Occupational History   Not on file  Tobacco Use   Smoking status: Former Smoker    Packs/day: 0.50    Years: 5.00    Pack years: 2.50    Types: Cigarettes    Quit date: 1970    Years since quitting: 51.2   Smokeless tobacco: Never Used  Substance and Sexual Activity   Alcohol use: Never   Drug use: Never   Sexual activity: Not Currently  Other Topics Concern   Not on file  Social History Narrative   Not on file   Social Determinants of Health   Financial Resource Strain:    Difficulty of Paying Living Expenses: Not on file  Food Insecurity:    Worried About Charity fundraiser in the Last Year: Not on file   [employer] of Food in the Last Year: Not on file  Transportation Needs:    Lack of Transportation (Medical): Not on file   Lack of Transportation (Non-Medical): Not on file  Physical Activity:    Days of Exercise per Week: Not on file   Minutes of Exercise per Session: Not on file  Stress:    Feeling of Stress : Not on file  Social Connections:    Frequency of Communication with Friends and Family: Not on file   Frequency of Social Gatherings with Friends and Family: Not on file   Attends Religious Services: Not on file   Active Member of Clubs or Organizations: Not on file   Attends Archivist Meetings: Not on file   Marital Status: Not on file  Intimate Partner Violence:    Fear of Current or  Ex-Partner: Not on file   Emotionally Abused: Not on file   Physically Abused: Not on file   Sexually Abused: Not on file    Family History  Problem Relation Age of Onset   Hypertension Mother    Prostate cancer Paternal Uncle    Breast cancer Neg Hx      Current Outpatient Medications:    cholecalciferol (VITAMIN D3) 25 MCG (1000 UNIT) tablet, Take 1,000 Units by mouth daily., Disp: , Rfl:    dexamethasone (DECADRON) 4 MG tablet, Take 2 tablets by mouth once a day on the day after chemotherapy and then take 2 tablets two times a day for 2 days. Take with food., Disp: 30 tablet, Rfl: 1   hydrochlorothiazide (HYDRODIURIL) 25 MG tablet, Take 25 mg by mouth daily., Disp: , Rfl:    latanoprost (XALATAN) 0.005 % ophthalmic solution, Place 1 drop into both eyes at bedtime. , Disp: , Rfl:    lidocaine-prilocaine (EMLA) cream, Apply to affected area once, Disp: 30 g, Rfl: 3   LORazepam (ATIVAN) 0.5 MG tablet, Take 1  tablet (0.5 mg total) by mouth every 6 (six) hours as needed (Nausea or vomiting)., Disp: 30 tablet, Rfl: 0   losartan (COZAAR) 100 MG tablet, Take 100 mg by mouth daily., Disp: , Rfl:    pantoprazole (PROTONIX) 20 MG tablet, Take 1 tablet (20 mg total) by mouth daily., Disp: 30 tablet, Rfl: 3   potassium chloride SA (KLOR-CON) 20 MEQ tablet, Take 1 tablet (20 mEq total) by mouth daily., Disp: 14 tablet, Rfl: 3   timolol (TIMOPTIC) 0.5 % ophthalmic solution, Place 1 drop into both eyes daily. , Disp: , Rfl:    acetaminophen (TYLENOL) 500 MG tablet, Take 500 mg by mouth every 6 (six) hours as needed., Disp: , Rfl:    ondansetron (ZOFRAN) 8 MG tablet, Take 1 tablet (8 mg total) by mouth 2 (two) times daily as needed. Start on the third day after chemotherapy. (Patient not taking: Reported on 06/18/2019), Disp: 30 tablet, Rfl: 1   prochlorperazine (COMPAZINE) 10 MG tablet, Take 1 tablet (10 mg total) by mouth every 6 (six) hours as needed (Nausea or vomiting). (Patient  not taking: Reported on 06/18/2019), Disp: 30 tablet, Rfl: 1 No current facility-administered medications for this visit.  Facility-Administered Medications Ordered in Other Visits:    heparin lock flush 100 unit/mL, 500 Units, Intravenous, Once, Sindy Guadeloupe, MD   sodium chloride flush (NS) 0.9 % injection 10 mL, 10 mL, Intravenous, PRN, Sindy Guadeloupe, MD, 10 mL at 06/19/19 0835  Physical exam:  Vitals:   06/19/19 0847  BP: 133/71  Pulse: 63  Resp: 18  Temp: (!) 97.2 F (36.2 C)  TempSrc: Tympanic  SpO2: 100%  Weight: 174 lb 14.4 oz (79.3 kg)   Physical Exam HENT:     Head: Normocephalic and atraumatic.  Eyes:     Pupils: Pupils are equal, round, and reactive to light.  Cardiovascular:     Rate and Rhythm: Normal rate and regular rhythm.     Heart sounds: Normal heart sounds.  Pulmonary:     Effort: Pulmonary effort is normal.     Breath sounds: Normal breath sounds.  Abdominal:     General: Bowel sounds are normal.     Palpations: Abdomen is soft.  Musculoskeletal:     Cervical back: Normal range of motion.  Skin:    General: Skin is warm and dry.  Neurological:     Mental Status: She is alert and oriented to person, place, and time.   Clinically left breast mass is about 2 cm in size and appears overall smaller with less induration around the area of the mass.  No palpable left axillary adenopathy.  CMP Latest Ref Rng & Units 06/19/2019  Glucose 70 - 99 mg/dL 111(H)  BUN 8 - 23 mg/dL 18  Creatinine 0.44 - 1.00 mg/dL 1.10(H)  Sodium 135 - 145 mmol/L 137  Potassium 3.5 - 5.1 mmol/L 2.8(L)  Chloride 98 - 111 mmol/L 104  CO2 22 - 32 mmol/L 22  Calcium 8.9 - 10.3 mg/dL 8.8(L)  Total Protein 6.5 - 8.1 g/dL 7.1  Total Bilirubin 0.3 - 1.2 mg/dL 0.3  Alkaline Phos 38 - 126 U/L 100  AST 15 - 41 U/L 19  ALT 0 - 44 U/L 23   CBC Latest Ref Rng & Units 06/19/2019  WBC 4.0 - 10.5 K/uL 13.7(H)  Hemoglobin 12.0 - 15.0 g/dL 10.0(L)  Hematocrit 36.0 - 46.0 % 30.7(L)    Platelets 150 - 400 K/uL 191    No images are  attached to the encounter.  MR BREAST BILATERAL W WO CONTRAST INC CAD  Result Date: 05/21/2019 CLINICAL DATA:  64 year old female with newly diagnosed left breast cancer in January 2021. Patient has a history of remote left lumpectomy for cancer 20 years ago. LABS:  None performed on site. EXAM: BILATERAL BREAST MRI WITH AND WITHOUT CONTRAST TECHNIQUE: Multiplanar, multisequence MR images of both breasts were obtained prior to and following the intravenous administration of 7.5 ml of Gadavist. Three-dimensional MR images were rendered by post-processing of the original MR data on an independent workstation. The three-dimensional MR images were interpreted, and findings are reported in the following complete MRI report for this study. Three dimensional images were evaluated at the independent [employer] workstation COMPARISON:  Previous exam(s). FINDINGS: Breast composition: c. Heterogeneous fibroglandular tissue. Background parenchymal enhancement: Mild on the left and moderate on the right. Right breast: Linear non mass enhancement is identified in the lower inner quadrant of the right breast spanning from mid to posterior depth (series 6, image 31/112. This spans approximately 5.3 cm in the AP dimension. No additional dominant masses or suspicious enhancement within the remainder of the right breast. Left breast: Susceptibility artifact from post biopsy clip is seen in association with an irregular, enhancing mass in the lower inner quadrant of the left breast at middle depth (series 6, image 26/112). This is consistent with the patient's site of recently diagnosed malignancy. The mass measures 2.4 x 1.9 x 1.7 cm. The mass appears to tether and dimple the overlying skin, though no definite skin enhancement is seen. Two additional enhancing masses are identified within the superior left breast. This includes a 9 x 6 x 10 mm mass (series 6, image 76/112) and a 5 x 5  x 5 mm mass (series 6, image 68/112). Both masses demonstrate rapid uptake and suspicious washout kinetics. Lymph nodes: There is no suspicious left axillary or internal mammary chain lymphadenopathy. However, note is made of several prominent right axillary lymph nodes demonstrating up to 8-9 mm of cortical thickness. Ancillary findings:  None. IMPRESSION: 1. 2.4 cm mass consistent with the patient's biopsy-proven site of malignancy in the lower inner left breast. 2. Two additional suspicious masses in the superior left breast measuring 9 and 5 mm. Recommendation is for second-look ultrasound with subsequent biopsy to follow. If ultrasound correlates cannot be identified, MRI guided biopsy is recommended. 3. Suspicious linear enhancement spanning 5 cm in the lower inner quadrant of the right breast. Recommendation is for MRI guided biopsy. 4. Prominent right axillary lymph nodes demonstrating 8-9 mm cortical thickness. Recommendation is for second-look ultrasound with subsequent biopsy as indicated. RECOMMENDATION: 1. Recommendation is that the patient first return for bilateral second-look ultrasound and possible ultrasound-guided biopsies. This should include the superior left breast for further evaluation of 2 possible masses measuring 9 and 5 mm (series 6, image 76 and 68/112) as well as the right axilla for prominent lymph nodes. 2. Pending above results, the patient may require additional MRI guided biopsy of 1 or both of the superior left breast masses. 3. Additionally, the patient will require MRI guided biopsy of suspicious linear enhancement in the medial right breast. Biopsies of both the anterior and posterior margin should be performed. This will likely require a medial approach and would require a separate visit from left-sided MRI guided biopsy (if they are to be performed). BI-RADS CATEGORY  4: Suspicious. Electronically Signed   By: Kristopher Oppenheim M.D.   On: 05/21/2019 12:46   US BREAST  LTD UNI  LEFT INC AXILLA  Addendum Date: 05/30/2019   ADDENDUM REPORT: 05/30/2019 10:25 ADDENDUM: The larger mass at 11:00 measuring 1.1 cm is labeled 10 cmfn. The smaller mass measuring 0.6 cm is labeled 9 cmfm. Electronically Signed   By: Audie Pinto M.D.   On: 05/30/2019 10:25   Result Date: 05/30/2019 CLINICAL DATA:  64 year old female with newly diagnosed left breast cancer in January 2021. Patient has a history of remote left lumpectomy for cancer 20 years ago. Patient presents for second-look ultrasound of the left breast and right axilla. EXAM: ULTRASOUND OF THE BILATERAL BREAST COMPARISON:  Previous exam(s). FINDINGS: Left breast: Targeted ultrasound is performed in the left breast at 11 o'clock 9 cm from nipple demonstrating two adjacent oval hypoechoic masses with indistinct margins, the larger measuring 1.0 x 0.4 x 1.1 cm, with internal blood flow. The smaller of the 2 masses located approximately 1 cm away measures 0.4 x 0.2 x 0.6 cm. These masses correspond to the findings identified on recent MR in the upper inner left breast. Right axilla: Targeted ultrasound the right axilla demonstrates two lymph nodes with borderline cortical thickening of 0.4 cm. There is retention of the fatty hilum in both. This corresponds to the findings seen on recent MRI. IMPRESSION: 1. At 11 o'clock in the left breast there are two indeterminate masses measuring 1.0 cm and 0.6 cm, which correspond to the suspicious findings on recent MRI. 2. There are two lymph nodes with borderline cortical thickening in the right axilla. RECOMMENDATION: 1. Ultrasound-guided core needle biopsy x 2 of the adjacent masses in the left breast at 11 o'clock. 2. Ultrasound-guided core needle biopsy of a right axillary lymph node with borderline cortical thickening. The biopsies will be performed same day. I have discussed the findings and recommendations with the patient. BI-RADS CATEGORY  4: Suspicious. Electronically Signed: By: Audie Pinto M.D. On: 05/30/2019 09:42   US BREAST LTD UNI RIGHT INC AXILLA  Addendum Date: 05/30/2019   ADDENDUM REPORT: 05/30/2019 10:25 ADDENDUM: The larger mass at 11:00 measuring 1.1 cm is labeled 10 cmfn. The smaller mass measuring 0.6 cm is labeled 9 cmfm. Electronically Signed   By: Audie Pinto M.D.   On: 05/30/2019 10:25   Result Date: 05/30/2019 CLINICAL DATA:  64 year old female with newly diagnosed left breast cancer in January 2021. Patient has a history of remote left lumpectomy for cancer 20 years ago. Patient presents for second-look ultrasound of the left breast and right axilla. EXAM: ULTRASOUND OF THE BILATERAL BREAST COMPARISON:  Previous exam(s). FINDINGS: Left breast: Targeted ultrasound is performed in the left breast at 11 o'clock 9 cm from nipple demonstrating two adjacent oval hypoechoic masses with indistinct margins, the larger measuring 1.0 x 0.4 x 1.1 cm, with internal blood flow. The smaller of the 2 masses located approximately 1 cm away measures 0.4 x 0.2 x 0.6 cm. These masses correspond to the findings identified on recent MR in the upper inner left breast. Right axilla: Targeted ultrasound the right axilla demonstrates two lymph nodes with borderline cortical thickening of 0.4 cm. There is retention of the fatty hilum in both. This corresponds to the findings seen on recent MRI. IMPRESSION: 1. At 11 o'clock in the left breast there are two indeterminate masses measuring 1.0 cm and 0.6 cm, which correspond to the suspicious findings on recent MRI. 2. There are two lymph nodes with borderline cortical thickening in the right axilla. RECOMMENDATION: 1. Ultrasound-guided core needle biopsy x 2 of the  adjacent masses in the left breast at 11 o'clock. 2. Ultrasound-guided core needle biopsy of a right axillary lymph node with borderline cortical thickening. The biopsies will be performed same day. I have discussed the findings and recommendations with the patient. BI-RADS  CATEGORY  4: Suspicious. Electronically Signed: By: Audie Pinto M.D. On: 05/30/2019 09:42   MM CLIP PLACEMENT LEFT  Result Date: 05/30/2019 CLINICAL DATA:  64 year old female with remote history of left breast cancer status post lumpectomy. New left breast cancer diagnosis with recent MRI demonstrating two additional masses in the left breast as well as indeterminate right axillary lymph nodes. Patient underwent ultrasound-guided core needle biopsy of the 2 masses in the left superior breast as well as of a right axillary lymph node. EXAM: DIAGNOSTIC BILATERAL MAMMOGRAM POST ULTRASOUND BIOPSY COMPARISON:  Previous exam(s). FINDINGS: Mammographic images were obtained following ultrasound guided biopsy of a left breast mass at 11 o'clock 9 cm from the nipple. The ribbon biopsy marking clip is in expected position at the site of biopsy. Mammographic images were obtained following ultrasound guided biopsy of left breast mass at 11 o'clock 10 cm from the nipple. The heart biopsy marking clip is in expected position at the site of biopsy. Mammographic images were obtained following ultrasound guided biopsy of a right axillary lymph node. The biopsy marking clip was identified at the site of biopsy sonographically. A biopsy marking clip is possibly seen in the right axilla just posterior to the patient's Port-A-Cath. IMPRESSION: Appropriate positioning of the ribbon shaped biopsy marking clip at the site of biopsy in the left breast at 11 o'clock 9 cm from the nipple. Appropriate positioning of the heart shaped biopsy marking clip at the site of biopsy in the left breast at 11 o'clock 10 cm from the nipple. Appropriate positioning of the [facility] shaped biopsy marking clip at the site of biopsy in the right axilla. Final Assessment: Post Procedure Mammograms for Marker Placement Electronically Signed   By: Audie Pinto M.D.   On: 05/30/2019 10:49   MM CLIP PLACEMENT RIGHT  Result Date:  05/30/2019 CLINICAL DATA:  64 year old female with remote history of left breast cancer status post lumpectomy. New left breast cancer diagnosis with recent MRI demonstrating two additional masses in the left breast as well as indeterminate right axillary lymph nodes. Patient underwent ultrasound-guided core needle biopsy of the 2 masses in the left superior breast as well as of a right axillary lymph node. EXAM: DIAGNOSTIC BILATERAL MAMMOGRAM POST ULTRASOUND BIOPSY COMPARISON:  Previous exam(s). FINDINGS: Mammographic images were obtained following ultrasound guided biopsy of a left breast mass at 11 o'clock 9 cm from the nipple. The ribbon biopsy marking clip is in expected position at the site of biopsy. Mammographic images were obtained following ultrasound guided biopsy of left breast mass at 11 o'clock 10 cm from the nipple. The heart biopsy marking clip is in expected position at the site of biopsy. Mammographic images were obtained following ultrasound guided biopsy of a right axillary lymph node. The biopsy marking clip was identified at the site of biopsy sonographically. A biopsy marking clip is possibly seen in the right axilla just posterior to the patient's Port-A-Cath. IMPRESSION: Appropriate positioning of the ribbon shaped biopsy marking clip at the site of biopsy in the left breast at 11 o'clock 9 cm from the nipple. Appropriate positioning of the heart shaped biopsy marking clip at the site of biopsy in the left breast at 11 o'clock 10 cm from the nipple. Appropriate positioning  of the [facility] shaped biopsy marking clip at the site of biopsy in the right axilla. Final Assessment: Post Procedure Mammograms for Marker Placement Electronically Signed   By: Audie Pinto M.D.   On: 05/30/2019 10:49   [place] LT BREAST BX W LOC DEV 1ST LESION IMG BX SPEC US GUIDE  Addendum Date: 06/04/2019   ADDENDUM REPORT: 06/04/2019 15:32 ADDENDUM: PATHOLOGY revealed: A. LEFT BREAST, 11:00 9 CMFN;  ULTRASOUND-GUIDED BIOPSY: - INVASIVE MAMMARY CARCINOMA, AS DESCRIBED BELOW (SEE PART B). B. LEFT BREAST, 11:00 10 CMFN; ULTRASOUND-GUIDED BIOPSY: - INVASIVE MAMMARY CARCINOMA, NO SPECIAL TYPE. 7 mm in this sample. Grade 3. Ductal carcinoma in situ: not identified. Lymphovascular invasion: Not identified. Comment: The tumor is compared to that in the previous biopsy case [id number] and is morphologically similar. C. AXILLARY LYMPH NODE, RIGHT; ULTRASOUND-GUIDED BIOPSY: - BENIGN LYMPH NODE. - NEGATIVE FOR METASTATIC CARCINOMA. Pathology results are CONCORDANT with imaging findings, per Dr. Audie Pinto. Multiple attempts to contact patient were unsuccessful. Notified patient's provider office, and spoke with Jarrett Soho RN requesting they contact patient with biopsy results. Hassan Rowan stated they will inform patient of her biopsy results and recommendations at patient's next appointment with Dr. Janese Banks Thursday (06/05/2019). Recommendation: 1. Surgical referral. Request for surgical referral was relayed to nurse navigators at [facility] by Electa Sniff RN on 06/03/2019. 2. MRI guided biopsy for non-mass enhancement of the RIGHT breast before proceeding to surgical management. Jarrett Soho RN will inform Dr. Janese Banks of MRI recommendation. Addendum by Electa Sniff RN on 06/04/2019. Electronically Signed   By: Audie Pinto M.D.   On: 06/04/2019 15:32   Result Date: 06/04/2019 CLINICAL DATA:  64 year old female with history of remote left breast cancer status post lumpectomy. Patient has newly diagnosed left breast cancer with two additional masses detected on MR in the left breast as well as an indeterminate right axillary lymph node. EXAM: ULTRASOUND GUIDED LEFT BREAST CORE NEEDLE BIOPSY ULTRASOUND GUIDED LEFT BREAST CORE NEEDLE BIOPSY ULTRASOUND GUIDED RIGHT AXILLARY CORE NEEDLE BIOPSY COMPARISON:  Previous exam(s). FINDINGS: I met with the patient and we discussed the procedure of  ultrasound-guided biopsy, including benefits and alternatives. We discussed the high likelihood of a successful procedure. We discussed the risks of the procedure, including infection, bleeding, tissue injury, clip migration, and inadequate sampling. Informed written consent was given. The usual time-out protocol was performed immediately prior to the procedure. 1.  Lesion quadrant: Upper inner quadrant Using sterile technique and 1% Lidocaine as local anesthetic, under direct ultrasound visualization, a 14 gauge spring-loaded device was used to perform biopsy of a left breast mass at 11 o'clock 9 cm from the nipple using a medial approach. At the conclusion of the procedure a ribbon tissue marker clip was deployed into the biopsy cavity. Follow up 2 view mammogram was performed and dictated separately. 2.  Lesion quadrant: Upper inner quadrant Using sterile technique and 1% Lidocaine as local anesthetic, under direct ultrasound visualization, a 14 gauge spring-loaded device was used to perform biopsy of a left breast mass at 11 o'clock 10 cm from the nipple using a medial approach. At the conclusion of the procedure a heart tissue marker clip was deployed into the biopsy cavity. Follow up 2 view mammogram was performed and dictated separately. 3. Axilla Using sterile technique and 1% Lidocaine as local anesthetic, under direct ultrasound visualization, a 14 gauge spring-loaded device was used to perform biopsy of a right axillary lymph node using a inferior approach. At the conclusion of the  procedure a HydroMARK tissue marker clip was deployed into the biopsy cavity. Follow up 2 view mammogram was performed and dictated separately. IMPRESSION: Ultrasound guided biopsy of a left breast mass at 11 o'clock 9 cm from nipple, a left breast mass at 11 o'clock 10 cm from nipple, and a right axillary lymph node. No apparent complications. Electronically Signed: By: Audie Pinto M.D. On: 05/30/2019 10:56   [place] LT  BREAST BX W LOC DEV EA ADD LESION IMG BX SPEC US GUIDE  Addendum Date: 06/04/2019   ADDENDUM REPORT: 06/04/2019 15:32 ADDENDUM: PATHOLOGY revealed: A. LEFT BREAST, 11:00 9 CMFN; ULTRASOUND-GUIDED BIOPSY: - INVASIVE MAMMARY CARCINOMA, AS DESCRIBED BELOW (SEE PART B). B. LEFT BREAST, 11:00 10 CMFN; ULTRASOUND-GUIDED BIOPSY: - INVASIVE MAMMARY CARCINOMA, NO SPECIAL TYPE. 7 mm in this sample. Grade 3. Ductal carcinoma in situ: not identified. Lymphovascular invasion: Not identified. Comment: The tumor is compared to that in the previous biopsy case [id number] and is morphologically similar. C. AXILLARY LYMPH NODE, RIGHT; ULTRASOUND-GUIDED BIOPSY: - BENIGN LYMPH NODE. - NEGATIVE FOR METASTATIC CARCINOMA. Pathology results are CONCORDANT with imaging findings, per Dr. Audie Pinto. Multiple attempts to contact patient were unsuccessful. Notified patient's provider office, and spoke with Jarrett Soho RN requesting they contact patient with biopsy results. Hassan Rowan stated they will inform patient of her biopsy results and recommendations at patient's next appointment with Dr. Janese Banks Thursday (06/05/2019). Recommendation: 1. Surgical referral. Request for surgical referral was relayed to nurse navigators at [facility] by Electa Sniff RN on 06/03/2019. 2. MRI guided biopsy for non-mass enhancement of the RIGHT breast before proceeding to surgical management. Jarrett Soho RN will inform Dr. Janese Banks of MRI recommendation. Addendum by Electa Sniff RN on 06/04/2019. Electronically Signed   By: Audie Pinto M.D.   On: 06/04/2019 15:32   Result Date: 06/04/2019 CLINICAL DATA:  64 year old female with history of remote left breast cancer status post lumpectomy. Patient has newly diagnosed left breast cancer with two additional masses detected on MR in the left breast as well as an indeterminate right axillary lymph node. EXAM: ULTRASOUND GUIDED LEFT BREAST CORE NEEDLE BIOPSY ULTRASOUND GUIDED LEFT BREAST  CORE NEEDLE BIOPSY ULTRASOUND GUIDED RIGHT AXILLARY CORE NEEDLE BIOPSY COMPARISON:  Previous exam(s). FINDINGS: I met with the patient and we discussed the procedure of ultrasound-guided biopsy, including benefits and alternatives. We discussed the high likelihood of a successful procedure. We discussed the risks of the procedure, including infection, bleeding, tissue injury, clip migration, and inadequate sampling. Informed written consent was given. The usual time-out protocol was performed immediately prior to the procedure. 1.  Lesion quadrant: Upper inner quadrant Using sterile technique and 1% Lidocaine as local anesthetic, under direct ultrasound visualization, a 14 gauge spring-loaded device was used to perform biopsy of a left breast mass at 11 o'clock 9 cm from the nipple using a medial approach. At the conclusion of the procedure a ribbon tissue marker clip was deployed into the biopsy cavity. Follow up 2 view mammogram was performed and dictated separately. 2.  Lesion quadrant: Upper inner quadrant Using sterile technique and 1% Lidocaine as local anesthetic, under direct ultrasound visualization, a 14 gauge spring-loaded device was used to perform biopsy of a left breast mass at 11 o'clock 10 cm from the nipple using a medial approach. At the conclusion of the procedure a heart tissue marker clip was deployed into the biopsy cavity. Follow up 2 view mammogram was performed and dictated separately. 3. Axilla Using sterile technique and 1% Lidocaine as  local anesthetic, under direct ultrasound visualization, a 14 gauge spring-loaded device was used to perform biopsy of a right axillary lymph node using a inferior approach. At the conclusion of the procedure a HydroMARK tissue marker clip was deployed into the biopsy cavity. Follow up 2 view mammogram was performed and dictated separately. IMPRESSION: Ultrasound guided biopsy of a left breast mass at 11 o'clock 9 cm from nipple, a left breast mass at 11  o'clock 10 cm from nipple, and a right axillary lymph node. No apparent complications. Electronically Signed: By: Audie Pinto M.D. On: 05/30/2019 10:56   [place] RT BREAST BX W LOC DEV 1ST LESION IMG BX SPEC US GUIDE  Addendum Date: 06/04/2019   ADDENDUM REPORT: 06/04/2019 15:32 ADDENDUM: PATHOLOGY revealed: A. LEFT BREAST, 11:00 9 CMFN; ULTRASOUND-GUIDED BIOPSY: - INVASIVE MAMMARY CARCINOMA, AS DESCRIBED BELOW (SEE PART B). B. LEFT BREAST, 11:00 10 CMFN; ULTRASOUND-GUIDED BIOPSY: - INVASIVE MAMMARY CARCINOMA, NO SPECIAL TYPE. 7 mm in this sample. Grade 3. Ductal carcinoma in situ: not identified. Lymphovascular invasion: Not identified. Comment: The tumor is compared to that in the previous biopsy case [id number] and is morphologically similar. C. AXILLARY LYMPH NODE, RIGHT; ULTRASOUND-GUIDED BIOPSY: - BENIGN LYMPH NODE. - NEGATIVE FOR METASTATIC CARCINOMA. Pathology results are CONCORDANT with imaging findings, per Dr. Audie Pinto. Multiple attempts to contact patient were unsuccessful. Notified patient's provider office, and spoke with Jarrett Soho RN requesting they contact patient with biopsy results. Hassan Rowan stated they will inform patient of her biopsy results and recommendations at patient's next appointment with Dr. Janese Banks Thursday (06/05/2019). Recommendation: 1. Surgical referral. Request for surgical referral was relayed to nurse navigators at [facility] by Electa Sniff RN on 06/03/2019. 2. MRI guided biopsy for non-mass enhancement of the RIGHT breast before proceeding to surgical management. Jarrett Soho RN will inform Dr. Janese Banks of MRI recommendation. Addendum by Electa Sniff RN on 06/04/2019. Electronically Signed   By: Audie Pinto M.D.   On: 06/04/2019 15:32   Result Date: 06/04/2019 CLINICAL DATA:  64 year old female with history of remote left breast cancer status post lumpectomy. Patient has newly diagnosed left breast cancer with two additional masses detected on  MR in the left breast as well as an indeterminate right axillary lymph node. EXAM: ULTRASOUND GUIDED LEFT BREAST CORE NEEDLE BIOPSY ULTRASOUND GUIDED LEFT BREAST CORE NEEDLE BIOPSY ULTRASOUND GUIDED RIGHT AXILLARY CORE NEEDLE BIOPSY COMPARISON:  Previous exam(s). FINDINGS: I met with the patient and we discussed the procedure of ultrasound-guided biopsy, including benefits and alternatives. We discussed the high likelihood of a successful procedure. We discussed the risks of the procedure, including infection, bleeding, tissue injury, clip migration, and inadequate sampling. Informed written consent was given. The usual time-out protocol was performed immediately prior to the procedure. 1.  Lesion quadrant: Upper inner quadrant Using sterile technique and 1% Lidocaine as local anesthetic, under direct ultrasound visualization, a 14 gauge spring-loaded device was used to perform biopsy of a left breast mass at 11 o'clock 9 cm from the nipple using a medial approach. At the conclusion of the procedure a ribbon tissue marker clip was deployed into the biopsy cavity. Follow up 2 view mammogram was performed and dictated separately. 2.  Lesion quadrant: Upper inner quadrant Using sterile technique and 1% Lidocaine as local anesthetic, under direct ultrasound visualization, a 14 gauge spring-loaded device was used to perform biopsy of a left breast mass at 11 o'clock 10 cm from the nipple using a medial approach. At the conclusion of the procedure  a heart tissue marker clip was deployed into the biopsy cavity. Follow up 2 view mammogram was performed and dictated separately. 3. Axilla Using sterile technique and 1% Lidocaine as local anesthetic, under direct ultrasound visualization, a 14 gauge spring-loaded device was used to perform biopsy of a right axillary lymph node using a inferior approach. At the conclusion of the procedure a HydroMARK tissue marker clip was deployed into the biopsy cavity. Follow up 2 view  mammogram was performed and dictated separately. IMPRESSION: Ultrasound guided biopsy of a left breast mass at 11 o'clock 9 cm from nipple, a left breast mass at 11 o'clock 10 cm from nipple, and a right axillary lymph node. No apparent complications. Electronically Signed: By: Audie Pinto M.D. On: 05/30/2019 10:56     Assessment and plan- Patient is a 64 y.o. 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   She is here for on treatment assessment prior to cycle 3 of dose dense AC chemotherapy  Counts okay to proceed with cycle 3 of dose dense AC chemotherapy today.  I will see her back in 2 weeks for cycle 4.  I will plan to get another ultrasound after 4 cycles of treatment.  Clinically her left breast mass appears smaller.  Patient will get right breast biopsy before her next treatment.  AKI: Improved continue to monitor  Hypokalemia: Potassium levels are lower today at 2.8.  She will receive 20 mEq of IV potassium today and we will send her prescription for oral potassium which she will start taking twice a day instead of once a day.  We will also give her information about the Taxol neuropathy study    Visit Diagnosis 1. Encounter for antineoplastic chemotherapy   2. Malignant neoplasm of lower-inner quadrant of left breast in female, estrogen receptor negative ([place])   3. AKI (acute kidney injury) ([place])   4. Hypokalemia      Dr. Randa Evens, MD, MPH [facility] [phone] 06/19/2019 8:38 AM

## 2019-06-19 NOTE — Research (Signed)
Met with patient Molly Walters to discuss the SWOG [id number] clinical trial after the study was presented to the patient by Dr. Janese Banks and she expressed interest in learning more. Reviewed details of the study including the fact that participation is voluntary and that we would perform all study procedures while she is here in clinic for a scheduled visit. Discussed with patient that she would be eligible for the study at the time she begins receiving Taxol infusions, and discussed the potential for developing neuropathy symptoms like numbness and tingling sensations in fingers, toes and feet with the Taxol. Explained that this study would not alter her treatment in any way, but that we would be performing regular tests to assess the amount of neuropathy she may be experiencing as well as the duration. Informed that the assessments would occur at baseline as well as weeks 4,8,12 & 24, then years 1, 2 & 3. Procedures that would be performed as part of the study were described to patient in detail including the neuropen and tuning fork tests, the study questionnaires and the study labs. Ms Bennetts was given a copy of the Informed Consent Form for the SWOG E1859 Protocol Version Date: 02/23/19 to take home and read for herself, as well as contact information for myself and Jeral Fruit, RN in the event she has any questions about the study/consent form. Plans made to discuss her decision about participation at her next clinic visit in 2 weeks. Patient was thanked for considering participation in a Research study. Yolande Jolly, BSN, MHA, OCN 06/19/2019 10:09 AM    Spoke with patient Molly Walters regarding participation in the SWOG [phone] study. Patient states she was considering study participation, but recently fell and injured her right leg. Reports she had an MRI yesterday, but is currently wearing a large brace on her right leg and is sitting in a wheelchair. She does not feel she is physically able to participate at this  time and is declining the study. Patient was also asked whethetr she is willing to participate in the [place] DCP-001 study and the purpose and description of the study were reviewed with her. Patient agrees that she would like to participate, but states she does not want to do it today because of her physical situation and she did not wear her glasses to be able to read the consent form. Study participation was deferred until her next clinic visit. Dr. Janese Banks was informed in person of patient's study decisions during the visit. Yolande Jolly, BSN, MHA, OCN 07/03/2019 9:18 AM

## 2019-06-20 ENCOUNTER — Other Ambulatory Visit: Payer: Self-pay

## 2019-06-20 ENCOUNTER — Inpatient Hospital Stay: Payer: BC Managed Care – PPO

## 2019-06-20 DIAGNOSIS — C50312 Malignant neoplasm of lower-inner quadrant of left female breast: Secondary | ICD-10-CM

## 2019-06-20 DIAGNOSIS — Z171 Estrogen receptor negative status [ER-]: Secondary | ICD-10-CM

## 2019-06-20 MED ORDER — PEGFILGRASTIM-CBQV 6 MG/0.6ML ~~LOC~~ SOSY
6.0000 mg | PREFILLED_SYRINGE | Freq: Once | SUBCUTANEOUS | Status: AC
  Administered 2019-06-20: 6 mg via SUBCUTANEOUS
  Filled 2019-06-20: qty 0.6

## 2019-06-20 NOTE — Progress Notes (Signed)
Verfied to give [place] today (udenyca and neulasta on schedule)

## 2019-06-26 ENCOUNTER — Ambulatory Visit
Admission: RE | Admit: 2019-06-26 | Discharge: 2019-06-26 | Disposition: A | Discharge status: Home / Self Care | Payer: BC Managed Care – PPO | Source: Ambulatory Visit | Attending: Oncology | Admitting: Oncology

## 2019-06-26 ENCOUNTER — Other Ambulatory Visit: Payer: Self-pay

## 2019-06-26 DIAGNOSIS — N63 Unspecified lump in unspecified breast: Secondary | ICD-10-CM

## 2019-06-26 MED ORDER — GADOBUTROL 1 MMOL/ML IV SOLN
7.0000 mL | Freq: Once | INTRAVENOUS | Status: AC | PRN
  Administered 2019-06-26: 7 mL via INTRAVENOUS

## 2019-07-03 ENCOUNTER — Inpatient Hospital Stay: Payer: BC Managed Care – PPO

## 2019-07-03 ENCOUNTER — Inpatient Hospital Stay (HOSPITAL_BASED_OUTPATIENT_CLINIC_OR_DEPARTMENT_OTHER): Payer: BC Managed Care – PPO | Admitting: Oncology

## 2019-07-03 ENCOUNTER — Encounter: Payer: Self-pay | Admitting: Oncology

## 2019-07-03 ENCOUNTER — Other Ambulatory Visit: Payer: Self-pay

## 2019-07-03 VITALS — BP 122/73 | HR 68 | Temp 97.8°F | Ht 66.0 in | Wt 171.0 lb

## 2019-07-03 DIAGNOSIS — C50312 Malignant neoplasm of lower-inner quadrant of left female breast: Secondary | ICD-10-CM | POA: Diagnosis not present

## 2019-07-03 DIAGNOSIS — Z5111 Encounter for antineoplastic chemotherapy: Secondary | ICD-10-CM

## 2019-07-03 DIAGNOSIS — Z171 Estrogen receptor negative status [ER-]: Secondary | ICD-10-CM | POA: Diagnosis not present

## 2019-07-03 DIAGNOSIS — D0511 Intraductal carcinoma in situ of right breast: Secondary | ICD-10-CM | POA: Diagnosis not present

## 2019-07-03 DIAGNOSIS — D6481 Anemia due to antineoplastic chemotherapy: Secondary | ICD-10-CM

## 2019-07-03 DIAGNOSIS — T451X5A Adverse effect of antineoplastic and immunosuppressive drugs, initial encounter: Secondary | ICD-10-CM

## 2019-07-03 LAB — COMPREHENSIVE METABOLIC PANEL
ALT: 25 U/L (ref 0–44)
AST: 19 U/L (ref 15–41)
Albumin: 3.9 g/dL (ref 3.5–5.0)
Alkaline Phosphatase: 97 U/L (ref 38–126)
Anion gap: 11 (ref 5–15)
BUN: 10 mg/dL (ref 8–23)
CO2: 23 mmol/L (ref 22–32)
Calcium: 9 mg/dL (ref 8.9–10.3)
Chloride: 104 mmol/L (ref 98–111)
Creatinine, Ser: 0.85 mg/dL (ref 0.44–1.00)
GFR calc Af Amer: >60 mL/min (ref >60)
GFR calc non Af Amer: >60 mL/min (ref >60)
Glucose, Bld: 107 mg/dL — ABNORMAL HIGH (ref 70–99)
Potassium: 3.6 mmol/L (ref 3.5–5.1)
Sodium: 138 mmol/L (ref 135–145)
Total Bilirubin: 0.5 mg/dL (ref 0.3–1.2)
Total Protein: 6.9 g/dL (ref 6.5–8.1)

## 2019-07-03 LAB — CBC WITH DIFFERENTIAL/PLATELET
Abs Immature Granulocytes: 1.85 10*3/uL — ABNORMAL HIGH (ref 0.00–0.07)
Basophils Absolute: 0.1 10*3/uL (ref 0.0–0.1)
Basophils Relative: 1 %
Eosinophils Absolute: 0 10*3/uL (ref 0.0–0.5)
Eosinophils Relative: 0 %
HCT: 29 % — ABNORMAL LOW (ref 36.0–46.0)
Hemoglobin: 9.8 g/dL — ABNORMAL LOW (ref 12.0–15.0)
Immature Granulocytes: 14 %
Lymphocytes Relative: 10 %
Lymphs Abs: 1.2 10*3/uL (ref 0.7–4.0)
MCH: 28.1 pg (ref 26.0–34.0)
MCHC: 33.8 g/dL (ref 30.0–36.0)
MCV: 83.1 fL (ref 80.0–100.0)
Monocytes Absolute: 1.5 10*3/uL — ABNORMAL HIGH (ref 0.1–1.0)
Monocytes Relative: 12 %
Neutro Abs: 8.2 10*3/uL — ABNORMAL HIGH (ref 1.7–7.7)
Neutrophils Relative %: 63 %
Platelets: 198 10*3/uL (ref 150–400)
RBC: 3.49 MIL/uL — ABNORMAL LOW (ref 3.87–5.11)
RDW: 14.1 % (ref 11.5–15.5)
WBC: 12.9 10*3/uL — ABNORMAL HIGH (ref 4.0–10.5)
nRBC: 1.5 % — ABNORMAL HIGH (ref 0.0–0.2)

## 2019-07-03 MED ORDER — DOXORUBICIN HCL CHEMO IV INJECTION 2 MG/ML
60.0000 mg/m2 | Freq: Once | INTRAVENOUS | Status: AC
  Administered 2019-07-03: 112 mg via INTRAVENOUS
  Filled 2019-07-03: qty 10

## 2019-07-03 MED ORDER — SODIUM CHLORIDE 0.9% FLUSH
10.0000 mL | Freq: Once | INTRAVENOUS | Status: AC
  Administered 2019-07-03: 10 mL via INTRAVENOUS
  Filled 2019-07-03: qty 10

## 2019-07-03 MED ORDER — HEPARIN SOD (PORK) LOCK FLUSH 100 UNIT/ML IV SOLN
500.0000 [IU] | Freq: Once | INTRAVENOUS | Status: AC
  Administered 2019-07-03: 500 [IU] via INTRAVENOUS
  Filled 2019-07-03: qty 5

## 2019-07-03 MED ORDER — SODIUM CHLORIDE 0.9 % IV SOLN
10.0000 mg | Freq: Once | INTRAVENOUS | Status: AC
  Administered 2019-07-03: 10 mg via INTRAVENOUS
  Filled 2019-07-03: qty 10

## 2019-07-03 MED ORDER — PALONOSETRON HCL INJECTION 0.25 MG/5ML
0.2500 mg | Freq: Once | INTRAVENOUS | Status: AC
  Administered 2019-07-03: 0.25 mg via INTRAVENOUS
  Filled 2019-07-03: qty 5

## 2019-07-03 MED ORDER — SODIUM CHLORIDE 0.9 % IV SOLN
Freq: Once | INTRAVENOUS | Status: AC
  Filled 2019-07-03: qty 250

## 2019-07-03 MED ORDER — HEPARIN SOD (PORK) LOCK FLUSH 100 UNIT/ML IV SOLN
500.0000 [IU] | Freq: Once | INTRAVENOUS | Status: DC | PRN
  Filled 2019-07-03: qty 5

## 2019-07-03 MED ORDER — SODIUM CHLORIDE 0.9 % IV SOLN
150.0000 mg | Freq: Once | INTRAVENOUS | Status: AC
  Administered 2019-07-03: 150 mg via INTRAVENOUS
  Filled 2019-07-03: qty 150

## 2019-07-03 MED ORDER — SODIUM CHLORIDE 0.9 % IV SOLN
600.0000 mg/m2 | Freq: Once | INTRAVENOUS | Status: AC
  Administered 2019-07-03: 1120 mg via INTRAVENOUS
  Filled 2019-07-03: qty 50

## 2019-07-03 NOTE — Progress Notes (Signed)
Patient stated that she had been having some abdominal bloating and constipation. Patient is currently drinking prune juice and plenty of water and was able to have a bowel movement.

## 2019-07-04 ENCOUNTER — Inpatient Hospital Stay: Payer: BC Managed Care – PPO

## 2019-07-04 DIAGNOSIS — C50312 Malignant neoplasm of lower-inner quadrant of left female breast: Secondary | ICD-10-CM | POA: Diagnosis not present

## 2019-07-04 MED ORDER — PEGFILGRASTIM-CBQV 6 MG/0.6ML ~~LOC~~ SOSY
6.0000 mg | PREFILLED_SYRINGE | Freq: Once | SUBCUTANEOUS | Status: AC
  Administered 2019-07-04: 6 mg via SUBCUTANEOUS
  Filled 2019-07-04: qty 0.6

## 2019-07-05 NOTE — Progress Notes (Signed)
Hematology/Oncology Consult note [facility]  Telephone:([phone] [phone]  Patient Care Team: Lanny Hurst, MD as PCP - General (Internal Medicine) Rico Junker, RN as Registered Nurse Theodore Demark, RN as Registered Nurse   Name of the patient: Molly Walters  [id number]  June 18, 1955   Date of visit: 07/05/19  Diagnosis- clinical prognostic stage Ia invasive mammary carcinoma cmT1 ccN0 cM0 ER/PR negative and HER-2/neu negative  Chief complaint/ Reason for visit-on treatment assessment prior to cycle 4 of dose dense AC chemotherapy  Heme/Onc history: patient is a 64 year old African-American female with past medical history significant for hypertension. She has had prior breast cancer in 2001 s/p lumpectomy followed by adjuvant radiation treatment. She did not require adjuvant chemotherapy. She does not remember taking any endocrine therapy as well.More recently patient underwent a bilateral screening mammogram in November 2020 which showed a possible mass in the left breast. This was followed by a diagnostic mammogram and ultrasound which showed a 1.6 cm mass at the 7 o'clock position in the left breast near the lumpectomy scar. No left axillary adenopathy. 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 Patient has seen Dr. Dahlia Byes and referred to [place] for further management.Patient is independent of her ADLs and IADLs. Family history significant for prostate cancer in her maternal uncle. No other family history of breast, ovarian pancreatic colon cancer or melanoma.  MRI bilateral breast showed:The size of the primary left breast mass appears larger on MRI close to 2.4 x 1.9 x 1.7 cm. She is also noted to have 2 additional enhancing masses 9 x 6 x 10 mm and 5 x 5 x 5 mm in the left breast with suspicious washout kinetics. No pathological left axillary adenopathy. Prominent  lymph nodes seen on the right side along with non-mass enhancement spanning 5.3 cm in the AP dimension. Both the enhancing masses in the left breast were positive for triple negative breast cancer.  Right sided axillary lymph node was negative for malignancy.  Right breast biopsy showed DCIS   Interval history-patient reports having some constipation a week ago and started drinking prune juice regularly after which her bowel movements were more regular.  She still has difficulty ambulating given her ankle sprain. It is gradually improving   ECOG PS- 1 Pain scale- 0   Review of systems- Review of Systems  Constitutional: Positive for malaise/fatigue. Negative for chills, fever and weight loss.  HENT: Negative for congestion, ear discharge and nosebleeds.   Eyes: Negative for blurred vision.  Respiratory: Negative for cough, hemoptysis, sputum production, shortness of breath and wheezing.   Cardiovascular: Negative for chest pain, palpitations, orthopnea and claudication.  Gastrointestinal: Negative for abdominal pain, blood in stool, constipation, diarrhea, heartburn, melena, nausea and vomiting.  Genitourinary: Negative for dysuria, flank pain, frequency, hematuria and urgency.  Musculoskeletal: Negative for back pain, joint pain and myalgias.  Skin: Negative for rash.  Neurological: Negative for dizziness, tingling, focal weakness, seizures, weakness and headaches.  Endo/Heme/Allergies: Does not bruise/bleed easily.  Psychiatric/Behavioral: Negative for depression and suicidal ideas. The patient does not have insomnia.       No Known Allergies   Past Medical History:  Diagnosis Date  . Breast cancer ([place]) 2001   left breast lumpectomy and rad tx  . Breast cancer ([place]) 02/2019   left breast mass w/ biopsy  . Hypertension   . Personal history of radiation therapy 2001  BREAST CA     Past Surgical History:  Procedure Laterality Date  . ABDOMINAL HYSTERECTOMY    . BREAST  BIOPSY Left 2001   POS  . BREAST BIOPSY Left 2021   positive  . BREAST LUMPECTOMY Left 2001  . PORTACATH PLACEMENT Right 05/16/2019   Procedure: INSERTION PORT-A-CATH;  Surgeon: Jules Husbands, MD;  Location: [facility] ORS;  Service: General;  Laterality: Right;    Social History   Socioeconomic History  . Marital status: Divorced    Spouse name: Not on file  . Number of children: Not on file  . Years of education: Not on file  . Highest education level: Not on file  Occupational History  . Not on file  Tobacco Use  . Smoking status: Former Smoker    Packs/day: 0.50    Years: 5.00    Pack years: 2.50    Types: Cigarettes    Quit date: 1970    Years since quitting: 51.2  . Smokeless tobacco: Never Used  Substance and Sexual Activity  . Alcohol use: Never  . Drug use: Never  . Sexual activity: Not Currently  Other Topics Concern  . Not on file  Social History Narrative  . Not on file   Social Determinants of Health   Financial Resource Strain:   . Difficulty of Paying Living Expenses:   Food Insecurity:   . Worried About Charity fundraiser in the Last Year:   . Arboriculturist in the Last Year:   Transportation Needs:   . Film/video editor (Medical):   Marland Kitchen Lack of Transportation (Non-Medical):   Physical Activity:   . Days of Exercise per Week:   . Minutes of Exercise per Session:   Stress:   . Feeling of Stress :   Social Connections:   . Frequency of Communication with Friends and Family:   . Frequency of Social Gatherings with Friends and Family:   . Attends Religious Services:   . Active Member of Clubs or Organizations:   . Attends Archivist Meetings:   Marland Kitchen Marital Status:   Intimate Partner Violence:   . Fear of Current or Ex-Partner:   . Emotionally Abused:   Marland Kitchen Physically Abused:   . Sexually Abused:     Family History  Problem Relation Age of Onset  . Hypertension Mother   . Prostate cancer Paternal Uncle   . Breast cancer Neg Hx       Current Outpatient Medications:  .  acetaminophen (TYLENOL) 500 MG tablet, Take 500 mg by mouth every 6 (six) hours as needed., Disp: , Rfl:  .  cholecalciferol (VITAMIN D3) 25 MCG (1000 UNIT) tablet, Take 1,000 Units by mouth daily., Disp: , Rfl:  .  dexamethasone (DECADRON) 4 MG tablet, Take 2 tablets by mouth once a day on the day after chemotherapy and then take 2 tablets two times a day for 2 days. Take with food., Disp: 30 tablet, Rfl: 1 .  hydrochlorothiazide (HYDRODIURIL) 25 MG tablet, Take 25 mg by mouth daily., Disp: , Rfl:  .  latanoprost (XALATAN) 0.005 % ophthalmic solution, Place 1 drop into both eyes at bedtime. , Disp: , Rfl:  .  lidocaine-prilocaine (EMLA) cream, Apply to affected area once, Disp: 30 g, Rfl: 3 .  LORazepam (ATIVAN) 0.5 MG tablet, Take 1 tablet (0.5 mg total) by mouth every 6 (six) hours as needed (Nausea or vomiting)., Disp: 30 tablet, Rfl: 0 .  losartan (COZAAR) 100 MG tablet,  Take 100 mg by mouth daily., Disp: , Rfl:  .  pantoprazole (PROTONIX) 20 MG tablet, Take 1 tablet (20 mg total) by mouth daily., Disp: 30 tablet, Rfl: 3 .  potassium chloride SA (KLOR-CON) 20 MEQ tablet, Take 1 tablet (20 mEq total) by mouth 2 (two) times daily., Disp: 28 tablet, Rfl: 3 .  timolol (TIMOPTIC) 0.5 % ophthalmic solution, Place 1 drop into both eyes daily. , Disp: , Rfl:  .  ondansetron (ZOFRAN) 8 MG tablet, Take 1 tablet (8 mg total) by mouth 2 (two) times daily as needed. Start on the third day after chemotherapy. (Patient not taking: Reported on 06/18/2019), Disp: 30 tablet, Rfl: 1 .  prochlorperazine (COMPAZINE) 10 MG tablet, Take 1 tablet (10 mg total) by mouth every 6 (six) hours as needed (Nausea or vomiting). (Patient not taking: Reported on 06/18/2019), Disp: 30 tablet, Rfl: 1  Physical exam:  Vitals:   07/03/19 0845  BP: 122/73  Pulse: 68  Temp: 97.8 F (36.6 C)  TempSrc: Tympanic  Weight: 171 lb (77.6 kg)  Height: _0  (1.676 m)   Physical  Exam Constitutional:      General: She is not in acute distress.    Comments: She is sitting ina wheelchair  HENT:     Head: Normocephalic and atraumatic.  Eyes:     Pupils: Pupils are equal, round, and reactive to light.  Cardiovascular:     Rate and Rhythm: Normal rate and regular rhythm.     Heart sounds: Normal heart sounds.  Pulmonary:     Effort: Pulmonary effort is normal.     Breath sounds: Normal breath sounds.  Abdominal:     General: Bowel sounds are normal.     Palpations: Abdomen is soft.  Musculoskeletal:     Cervical back: Normal range of motion.  Skin:    General: Skin is warm and dry.  Neurological:     Mental Status: She is alert and oriented to person, place, and time.      CMP Latest Ref Rng & Units 07/03/2019  Glucose 70 - 99 mg/dL 107(H)  BUN 8 - 23 mg/dL 10  Creatinine 0.44 - 1.00 mg/dL 0.85  Sodium 135 - 145 mmol/L 138  Potassium 3.5 - 5.1 mmol/L 3.6  Chloride 98 - 111 mmol/L 104  CO2 22 - 32 mmol/L 23  Calcium 8.9 - 10.3 mg/dL 9.0  Total Protein 6.5 - 8.1 g/dL 6.9  Total Bilirubin 0.3 - 1.2 mg/dL 0.5  Alkaline Phos 38 - 126 U/L 97  AST 15 - 41 U/L 19  ALT 0 - 44 U/L 25   CBC Latest Ref Rng & Units 07/03/2019  WBC 4.0 - 10.5 K/uL 12.9(H)  Hemoglobin 12.0 - 15.0 g/dL 9.8(L)  Hematocrit 36.0 - 46.0 % 29.0(L)  Platelets 150 - 400 K/uL 198    No images are attached to the encounter.  MM CLIP PLACEMENT RIGHT  Result Date: 06/26/2019 CLINICAL DATA:  Post MRI guided biopsy of nodular linear enhancement in the lower inner far posterior right breast. EXAM: DIAGNOSTIC RIGHT MAMMOGRAM POST MRI BIOPSY COMPARISON:  Previous exam(s). FINDINGS: Mammographic images were obtained following MRI guided biopsy of nodular and linear enhancement within the lower inner far posterior right breast. Multiple attempts were made however the cylindrical shaped biopsy marking clip could not be identified. This is felt to have deployed properly and likely not visualized due  to the far posteromedial location. IMPRESSION: Non visualization of the biopsy marking clip. This is felt  to have deployed properly, however likely not visualized due to the far posteromedial location. If biopsy results return as abnormal/surgical, then recommend the patient return for placement of a HydroMARK clip with MRI guidance. Final Assessment: Post Procedure Mammograms for Marker Placement Electronically Signed   By: Everlean Alstrom M.D.   On: 06/26/2019 12:51   MR RT BREAST BX W LOC DEV 1ST LESION IMAGE BX SPEC MR GUIDE  Addendum Date: 07/01/2019   ADDENDUM REPORT: 06/30/2019 08:16 ADDENDUM: Pathology revealed LOW GRADE DUCTAL CARCINOMA IN SITU WITH CALCIFICATIONS, PAPILLARY FEATURES of the RIGHT breast, lower inner posterior. This was found to be concordant by Dr. Everlean Alstrom. Pathology results were discussed with the patient by telephone. The patient reported doing well after the biopsy with tenderness and swelling at the site. Post biopsy instructions and care were reviewed and questions were answered. The patient was encouraged to call [facility] for any additional concerns. The patient has a recent diagnosis of LEFT breast cancer and should follow her outlined treatment plan. Non visualization of the biopsy marking clip noted on post biopsy clip film image. This is felt to have deployed properly, however likely not visualized due to the far posteromedial location. Only the most anterior portion of the 5.3 cm linear oriented non mass enhancement in the lower inner right breast was biopsied due to the far posterior location. If breast conservation is being considered, then the patient would have to return for MRI guided placement of a HydroMARK clip at the biopsy site. If possible, placement of an additional HydroMARK clip at the most posterior portion of the linear non mass enhancement should be attempted, although it is doubtful this will be successful. This should  be performed as soon as possible as the patient is currently undergoing neoadjuvant chemotherapy and the enhancement at site of biopsy proven DCIS may not be visible on MRI much longer. Following placement of HydroMARK biopsy marking clips, the patient will need to return to the breast Center for mammograms to see if the clips can be visualized mammographically. In addition, an ultrasound is recommended to document position of the [facility] clip sonographically. This information was relayed to Tanya Nones, RN, Nurse Navigator at [facility] via [facility] message on June 27, 2019. Pathology results reported by Terie Purser, RN on 06/30/2019. Electronically Signed   By: Everlean Alstrom M.D.   On: 06/30/2019 08:16   Result Date: 07/01/2019 CLINICAL DATA:  64 year old female with linear nodular enhancement spanning 5.3 cm involving the lower inner posterior right breast. EXAM: MRI GUIDED CORE NEEDLE BIOPSY OF THE RIGHT BREAST TECHNIQUE: Multiplanar, multisequence MR imaging of the right breast was performed both before and after administration of intravenous contrast. CONTRAST:  74m GADAVIST GADOBUTROL 1 MMOL/ML IV SOLN COMPARISON:  Previous exams. FINDINGS: I met with the patient, and we discussed the procedure of MRI guided biopsy, including risks, benefits, and alternatives. Specifically, we discussed the risks of infection, bleeding, tissue injury, clip migration, and inadequate sampling. Informed, written consent was given. The usual time out protocol was performed immediately prior to the procedure. Using sterile technique, 1% Lidocaine, MRI guidance, and a 9 gauge vacuum assisted device, biopsy was performed of the linear enhancement within the lower inner far posterior right breast using a medial to lateral approach. At the conclusion of the procedure, a cylindrical shaped tissue marker clip was deployed into the biopsy cavity. Follow-up 2-view mammogram was performed and dictated separately.  IMPRESSION: MRI guided biopsy of  the linear nodular enhancement within the lower posterior inner right breast. MRI guided biopsy of anterior and posterior margins of this enhancement was recommended, however due to the far posterior location only 1 biopsy was performed. No apparent complications. Electronically Signed: By: Everlean Alstrom M.D. On: 06/26/2019 09:58     Assessment and plan- Patient is a 64 y.o. 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    She is here for on treatment assessment prior to cycle 4 of dose dense AC chemotherapy  Counts okay to proceed with cycle 4 of dose dense AC chemotherapy today with ongoing Neulasta support.  I will obtain an interim ultrasound to assess response to chemotherapy so far.  I will see her back in 3 weeks time with CBC with differential, CMP for cycle 1 of carboplatin AUC 5 and Taxol 80 mg per metered square weekly for 12 cycles.  I am adding carboplatin to her regimen given her triple negative breast cancer and 2.4 cm breast mass and 2 other foci.  Chemo-induced anemia: We will add iron studies B12 and folate with next set of labs  Patient also had a right breast biopsy which showed DCIS.  She would need surgical resection for the same.  Patient is leaning towards a bilateral mastectomy at this time    Visit Diagnosis 1. Malignant neoplasm of lower-inner quadrant of left breast in female, estrogen receptor negative ([place])   2. Encounter for antineoplastic chemotherapy   3. Ductal carcinoma in situ (DCIS) of right breast   4. Anemia due to antineoplastic chemotherapy      Dr. Randa Evens, MD, MPH [facility] at [facility] [phone] 07/05/2019 11:26 AM

## 2019-07-08 ENCOUNTER — Other Ambulatory Visit: Payer: BC Managed Care – PPO

## 2019-07-09 ENCOUNTER — Telehealth: Payer: Self-pay | Admitting: *Deleted

## 2019-07-09 NOTE — Telephone Encounter (Signed)
Called pt to see how she is doing on chemo-she states that everything tastes bad. She loves to drink sodas and she has not drank one in 4-5 weeks because of how bad the taste is. I told her that a lot of pt's says that fruit still taste the same to them or at least a little taste that to it. I suggested to try grapes, canteloupe, watermelon. She says that she has been eating strawberries and I told her that she should wash it really good because it has little divets in it that you have wash good. She says she is eating and she puts salt on things and drinks water. She only drinks about 4 cups a day and sometimes an apple juice. I encouraged to drink more water. When she comes back on next time to cancer center if she would ask out team then we could give her ensure/ boost samples for her to try

## 2019-07-10 NOTE — Progress Notes (Signed)
Pharmacist Chemotherapy Monitoring - Follow Up Assessment    I verify that I have reviewed each item in the below checklist:  . Regimen for the patient is scheduled for the appropriate day and plan matches scheduled date. Marland Kitchen Appropriate non-routine labs are ordered dependent on drug ordered. . If applicable, additional medications reviewed and ordered per protocol based on lifetime cumulative doses and/or treatment regimen.   Plan for follow-up and/or issues identified: No . I-vent associated with next due treatment: No . MD and/or nursing notified: No  Molly Walters 07/10/2019 8:38 AM

## 2019-07-14 ENCOUNTER — Telehealth: Payer: BC Managed Care – PPO | Admitting: Licensed Clinical Social Worker

## 2019-07-14 ENCOUNTER — Inpatient Hospital Stay: Payer: BC Managed Care – PPO | Admitting: Licensed Clinical Social Worker

## 2019-07-16 ENCOUNTER — Inpatient Hospital Stay: Payer: BC Managed Care – PPO | Admitting: Genetic Counselor

## 2019-07-17 ENCOUNTER — Inpatient Hospital Stay: Payer: BC Managed Care – PPO | Attending: Oncology

## 2019-07-17 ENCOUNTER — Telehealth: Payer: BC Managed Care – PPO | Admitting: Genetic Counselor

## 2019-07-17 ENCOUNTER — Other Ambulatory Visit: Payer: Self-pay

## 2019-07-17 ENCOUNTER — Inpatient Hospital Stay: Payer: BC Managed Care – PPO

## 2019-07-17 ENCOUNTER — Encounter: Payer: Self-pay | Admitting: *Deleted

## 2019-07-17 ENCOUNTER — Inpatient Hospital Stay (HOSPITAL_BASED_OUTPATIENT_CLINIC_OR_DEPARTMENT_OTHER): Payer: BC Managed Care – PPO | Admitting: Oncology

## 2019-07-17 ENCOUNTER — Encounter: Payer: Self-pay | Admitting: Oncology

## 2019-07-17 VITALS — BP 158/88 | HR 67 | Resp 17

## 2019-07-17 VITALS — BP 149/73 | HR 70 | Temp 97.6°F | Resp 16 | Wt 171.0 lb

## 2019-07-17 DIAGNOSIS — R5381 Other malaise: Secondary | ICD-10-CM | POA: Diagnosis not present

## 2019-07-17 DIAGNOSIS — Z87891 Personal history of nicotine dependence: Secondary | ICD-10-CM | POA: Insufficient documentation

## 2019-07-17 DIAGNOSIS — Z7952 Long term (current) use of systemic steroids: Secondary | ICD-10-CM | POA: Insufficient documentation

## 2019-07-17 DIAGNOSIS — D6481 Anemia due to antineoplastic chemotherapy: Secondary | ICD-10-CM

## 2019-07-17 DIAGNOSIS — Z171 Estrogen receptor negative status [ER-]: Secondary | ICD-10-CM

## 2019-07-17 DIAGNOSIS — C50312 Malignant neoplasm of lower-inner quadrant of left female breast: Secondary | ICD-10-CM | POA: Diagnosis not present

## 2019-07-17 DIAGNOSIS — Z5111 Encounter for antineoplastic chemotherapy: Secondary | ICD-10-CM

## 2019-07-17 DIAGNOSIS — T451X5A Adverse effect of antineoplastic and immunosuppressive drugs, initial encounter: Secondary | ICD-10-CM | POA: Diagnosis not present

## 2019-07-17 DIAGNOSIS — R5383 Other fatigue: Secondary | ICD-10-CM | POA: Insufficient documentation

## 2019-07-17 DIAGNOSIS — Z79899 Other long term (current) drug therapy: Secondary | ICD-10-CM | POA: Diagnosis not present

## 2019-07-17 DIAGNOSIS — Z923 Personal history of irradiation: Secondary | ICD-10-CM | POA: Diagnosis not present

## 2019-07-17 DIAGNOSIS — I10 Essential (primary) hypertension: Secondary | ICD-10-CM | POA: Insufficient documentation

## 2019-07-17 DIAGNOSIS — E876 Hypokalemia: Secondary | ICD-10-CM | POA: Diagnosis not present

## 2019-07-17 LAB — IRON AND TIBC
Iron: 75 ug/dL (ref 28–170)
Saturation Ratios: 33 % — ABNORMAL HIGH (ref 10.4–31.8)
TIBC: 228 ug/dL — ABNORMAL LOW (ref 250–450)
UIBC: 153 ug/dL

## 2019-07-17 LAB — COMPREHENSIVE METABOLIC PANEL
ALT: 30 U/L (ref 0–44)
AST: 23 U/L (ref 15–41)
Albumin: 3.7 g/dL (ref 3.5–5.0)
Alkaline Phosphatase: 99 U/L (ref 38–126)
Anion gap: 8 (ref 5–15)
BUN: 9 mg/dL (ref 8–23)
CO2: 24 mmol/L (ref 22–32)
Calcium: 8.9 mg/dL (ref 8.9–10.3)
Chloride: 109 mmol/L (ref 98–111)
Creatinine, Ser: 0.92 mg/dL (ref 0.44–1.00)
GFR calc Af Amer: >60 mL/min (ref >60)
GFR calc non Af Amer: >60 mL/min (ref >60)
Glucose, Bld: 107 mg/dL — ABNORMAL HIGH (ref 70–99)
Potassium: 3.2 mmol/L — ABNORMAL LOW (ref 3.5–5.1)
Sodium: 141 mmol/L (ref 135–145)
Total Bilirubin: 0.4 mg/dL (ref 0.3–1.2)
Total Protein: 6.7 g/dL (ref 6.5–8.1)

## 2019-07-17 LAB — CBC WITH DIFFERENTIAL/PLATELET
Abs Immature Granulocytes: 2.45 10*3/uL — ABNORMAL HIGH (ref 0.00–0.07)
Basophils Absolute: 0.1 10*3/uL (ref 0.0–0.1)
Basophils Relative: 1 %
Eosinophils Absolute: 0 10*3/uL (ref 0.0–0.5)
Eosinophils Relative: 0 %
HCT: 24.6 % — ABNORMAL LOW (ref 36.0–46.0)
Hemoglobin: 8.3 g/dL — ABNORMAL LOW (ref 12.0–15.0)
Immature Granulocytes: 18 %
Lymphocytes Relative: 7 %
Lymphs Abs: 1 10*3/uL (ref 0.7–4.0)
MCH: 28.4 pg (ref 26.0–34.0)
MCHC: 33.7 g/dL (ref 30.0–36.0)
MCV: 84.2 fL (ref 80.0–100.0)
Monocytes Absolute: 1.4 10*3/uL — ABNORMAL HIGH (ref 0.1–1.0)
Monocytes Relative: 11 %
Neutro Abs: 8.5 10*3/uL — ABNORMAL HIGH (ref 1.7–7.7)
Neutrophils Relative %: 63 %
Platelets: 168 10*3/uL (ref 150–400)
RBC: 2.92 MIL/uL — ABNORMAL LOW (ref 3.87–5.11)
RDW: 15.9 % — ABNORMAL HIGH (ref 11.5–15.5)
Smear Review: NORMAL
WBC: 13.5 10*3/uL — ABNORMAL HIGH (ref 4.0–10.5)
nRBC: 3.2 % — ABNORMAL HIGH (ref 0.0–0.2)

## 2019-07-17 LAB — FERRITIN: Ferritin: 676 ng/mL — ABNORMAL HIGH (ref 11–307)

## 2019-07-17 LAB — VITAMIN B12: Vitamin B-12: 875 pg/mL (ref 180–914)

## 2019-07-17 LAB — FOLATE: Folate: 7.9 ng/mL (ref >5.9)

## 2019-07-17 MED ORDER — SODIUM CHLORIDE 0.9 % IV SOLN
80.0000 mg/m2 | Freq: Once | INTRAVENOUS | Status: AC
  Administered 2019-07-17: 150 mg via INTRAVENOUS
  Filled 2019-07-17: qty 25

## 2019-07-17 MED ORDER — HEPARIN SOD (PORK) LOCK FLUSH 100 UNIT/ML IV SOLN
500.0000 [IU] | Freq: Once | INTRAVENOUS | Status: DC | PRN
  Filled 2019-07-17: qty 5

## 2019-07-17 MED ORDER — HEPARIN SOD (PORK) LOCK FLUSH 100 UNIT/ML IV SOLN
INTRAVENOUS | Status: AC
  Filled 2019-07-17: qty 5

## 2019-07-17 MED ORDER — SODIUM CHLORIDE 0.9 % IV SOLN
Freq: Once | INTRAVENOUS | Status: AC
  Filled 2019-07-17: qty 250

## 2019-07-17 MED ORDER — SODIUM CHLORIDE 0.9 % IV SOLN
20.0000 mg | Freq: Once | INTRAVENOUS | Status: AC
  Administered 2019-07-17: 20 mg via INTRAVENOUS
  Filled 2019-07-17: qty 20

## 2019-07-17 MED ORDER — SODIUM CHLORIDE 0.9% FLUSH
10.0000 mL | Freq: Once | INTRAVENOUS | Status: AC
  Administered 2019-07-17: 09:00:00 10 mL via INTRAVENOUS
  Filled 2019-07-17: qty 10

## 2019-07-17 MED ORDER — SODIUM CHLORIDE 0.9 % IV SOLN
162.9000 mg | Freq: Once | INTRAVENOUS | Status: AC
  Administered 2019-07-17: 160 mg via INTRAVENOUS
  Filled 2019-07-17: qty 16

## 2019-07-17 MED ORDER — HEPARIN SOD (PORK) LOCK FLUSH 100 UNIT/ML IV SOLN
500.0000 [IU] | Freq: Once | INTRAVENOUS | Status: AC
  Administered 2019-07-17: 13:00:00 500 [IU] via INTRAVENOUS
  Filled 2019-07-17: qty 5

## 2019-07-17 MED ORDER — DIPHENHYDRAMINE HCL 50 MG/ML IJ SOLN
50.0000 mg | Freq: Once | INTRAMUSCULAR | Status: AC
  Administered 2019-07-17: 50 mg via INTRAVENOUS
  Filled 2019-07-17: qty 1

## 2019-07-17 MED ORDER — FAMOTIDINE IN NACL 20-0.9 MG/50ML-% IV SOLN
20.0000 mg | Freq: Once | INTRAVENOUS | Status: AC
  Administered 2019-07-17: 20 mg via INTRAVENOUS
  Filled 2019-07-17: qty 50

## 2019-07-17 NOTE — Progress Notes (Signed)
Pt drinking aneating ok,m food does not taste well. BM is better and she is eating prunes. Has bm once a day . She would like to know about other things to help her bowels. She does not due well with miralax

## 2019-07-17 NOTE — Research (Unsigned)
Met with patient Molly Walters this morning to discuss participation in the [place] DCP-001 study. She previously declined participation in the SWOG S1714 study due to her recent knee injury. Discussed purpose of the study with patient and she expressed interest in participating. Informed consent form reviewed with patient stressing that participation is voluntary, that she can withdraw at any time and for any reason, that she will not be paid for study participation, that there is no change to her treatment and no identified risks or benefits from participating. Reviewed HIPAA form as well and informed patient of measures we take to ensure data protection including not releasing her name or any personal information to the study. Molly Walters provided written consent for DCP-001 Protocol Version date: 10/08/18 and NIH DCP-001 SCOR HIPAA form dated 06/08/2014. Molly Walters completed the study worksheet while in clinic this morning. She was given copies of all signed consent forms for her records along with my contact information in the event she has questions or decides to withdraw from the study. Dr. Janese Banks was also notified of patient's decision to participate in this study. Yolande Jolly, BSN, MHA, OCN 07/17/2019 9:25 AM

## 2019-07-18 NOTE — Progress Notes (Signed)
Hematology/Oncology Consult note [facility]  [phone] [phone]  Patient Care Team: Lanny Hurst, MD as PCP - General (Internal Medicine) Rico Junker, RN as Registered Nurse Theodore Demark, RN as Registered Nurse   Name of the patient: Molly Walters  [id number]  08-Jan-1956   Date of visit: 07/18/19  Diagnosis- clinical prognostic stage Ia invasive mammary carcinoma cmT1 ccN0 cM0 ER/PR negative and HER-2/neu negative  Chief complaint/ Reason for visit-on treatment assessment prior to cycle 1 of carbotaxol chemotherapy  Heme/Onc history: patient is a 64 year old African-American female with past medical history significant for hypertension. She has had prior breast cancer in 2001 s/p lumpectomy followed by adjuvant radiation treatment. She did not require adjuvant chemotherapy. She does not remember taking any endocrine therapy as well.More recently patient underwent a bilateral screening mammogram in November 2020 which showed a possible mass in the left breast. This was followed by a diagnostic mammogram and ultrasound which showed a 1.6 cm mass at the 7 o'clock position in the left breast near the lumpectomy scar. No left axillary adenopathy. 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 Patient has seen Dr. Dahlia Byes and referred to [place] for further management.Patient is independent of her ADLs and IADLs. Family history significant for prostate cancer in her maternal uncle. No other family history of breast, ovarian pancreatic colon cancer or melanoma.  MRI bilateral breast showed:The size of the primary left breast mass appears larger on MRI close to 2.4 x 1.9 x 1.7 cm. She is also noted to have 2 additional enhancing masses 9 x 6 x 10 mm and 5 x 5 x 5 mm in the left breast with suspicious washout kinetics. No pathological left axillary adenopathy. Prominent  lymph nodes seen on the right side along with non-mass enhancement spanning 5.3 cm in the AP dimension.Both the enhancing masses in the left breast were positive for triple negative breast cancer. Right sided axillary lymph node was negative for malignancy.  Right breast biopsy showed DCIS  Interval history-patient still has a brace over her right knee following meniscal injury to her right knee.  Pain is slowly improving and her ambulation is getting better.  Reports ongoing fatigue.  ECOG PS- 1 Pain scale- 0   Review of systems- Review of Systems  Constitutional: Positive for malaise/fatigue. Negative for chills, fever and weight loss.  HENT: Negative for congestion, ear discharge and nosebleeds.   Eyes: Negative for blurred vision.  Respiratory: Negative for cough, hemoptysis, sputum production, shortness of breath and wheezing.   Cardiovascular: Negative for chest pain, palpitations, orthopnea and claudication.  Gastrointestinal: Negative for abdominal pain, blood in stool, constipation, diarrhea, heartburn, melena, nausea and vomiting.  Genitourinary: Negative for dysuria, flank pain, frequency, hematuria and urgency.  Musculoskeletal: Negative for back pain, joint pain and myalgias.  Skin: Negative for rash.  Neurological: Negative for dizziness, tingling, focal weakness, seizures, weakness and headaches.  Endo/Heme/Allergies: Does not bruise/bleed easily.  Psychiatric/Behavioral: Negative for depression and suicidal ideas. The patient does not have insomnia.       No Known Allergies   Past Medical History:  Diagnosis Date  . Breast cancer ([place]) 2001   left breast lumpectomy and rad tx  . Breast cancer ([place]) 02/2019   left breast mass w/ biopsy  . Hypertension   . Personal history of radiation therapy 2001   BREAST CA     Past Surgical History:  Procedure  Laterality Date  . ABDOMINAL HYSTERECTOMY    . BREAST BIOPSY Left 2001   POS  . BREAST BIOPSY Left 2021    positive  . BREAST LUMPECTOMY Left 2001  . PORTACATH PLACEMENT Right 05/16/2019   Procedure: INSERTION PORT-A-CATH;  Surgeon: Jules Husbands, MD;  Location: [facility] ORS;  Service: General;  Laterality: Right;    Social History   Socioeconomic History  . Marital status: Divorced    Spouse name: Not on file  . Number of children: Not on file  . Years of education: Not on file  . Highest education level: Not on file  Occupational History  . Not on file  Tobacco Use  . Smoking status: Former Smoker    Packs/day: 0.50    Years: 5.00    Pack years: 2.50    Types: Cigarettes    Quit date: 1970    Years since quitting: 51.2  . Smokeless tobacco: Never Used  Substance and Sexual Activity  . Alcohol use: Never  . Drug use: Never  . Sexual activity: Not Currently  Other Topics Concern  . Not on file  Social History Narrative  . Not on file   Social Determinants of Health   Financial Resource Strain:   . Difficulty of Paying Living Expenses:   Food Insecurity:   . Worried About Charity fundraiser in the Last Year:   . Arboriculturist in the Last Year:   Transportation Needs:   . Film/video editor (Medical):   Marland Kitchen Lack of Transportation (Non-Medical):   Physical Activity:   . Days of Exercise per Week:   . Minutes of Exercise per Session:   Stress:   . Feeling of Stress :   Social Connections:   . Frequency of Communication with Friends and Family:   . Frequency of Social Gatherings with Friends and Family:   . Attends Religious Services:   . Active Member of Clubs or Organizations:   . Attends Archivist Meetings:   Marland Kitchen Marital Status:   Intimate Partner Violence:   . Fear of Current or Ex-Partner:   . Emotionally Abused:   Marland Kitchen Physically Abused:   . Sexually Abused:     Family History  Problem Relation Age of Onset  . Hypertension Mother   . Prostate cancer Paternal Uncle   . Breast cancer Neg Hx      Current Outpatient Medications:  .  acetaminophen  (TYLENOL) 500 MG tablet, Take 500 mg by mouth every 6 (six) hours as needed., Disp: , Rfl:  .  cholecalciferol (VITAMIN D3) 25 MCG (1000 UNIT) tablet, Take 1,000 Units by mouth daily., Disp: , Rfl:  .  dexamethasone (DECADRON) 4 MG tablet, Take 2 tablets by mouth once a day on the day after chemotherapy and then take 2 tablets two times a day for 2 days. Take with food., Disp: 30 tablet, Rfl: 1 .  hydrochlorothiazide (HYDRODIURIL) 25 MG tablet, Take 25 mg by mouth daily., Disp: , Rfl:  .  latanoprost (XALATAN) 0.005 % ophthalmic solution, Place 1 drop into both eyes at bedtime. , Disp: , Rfl:  .  lidocaine-prilocaine (EMLA) cream, Apply to affected area once, Disp: 30 g, Rfl: 3 .  LORazepam (ATIVAN) 0.5 MG tablet, Take 1 tablet (0.5 mg total) by mouth every 6 (six) hours as needed (Nausea or vomiting)., Disp: 30 tablet, Rfl: 0 .  losartan (COZAAR) 100 MG tablet, Take 100 mg by mouth daily., Disp: , Rfl:  .  ondansetron (ZOFRAN) 8 MG tablet, Take 1 tablet (8 mg total) by mouth 2 (two) times daily as needed. Start on the third day after chemotherapy., Disp: 30 tablet, Rfl: 1 .  pantoprazole (PROTONIX) 20 MG tablet, Take 1 tablet (20 mg total) by mouth daily., Disp: 30 tablet, Rfl: 3 .  potassium chloride SA (KLOR-CON) 20 MEQ tablet, Take 1 tablet (20 mEq total) by mouth 2 (two) times daily., Disp: 28 tablet, Rfl: 3 .  timolol (TIMOPTIC) 0.5 % ophthalmic solution, Place 1 drop into both eyes daily. , Disp: , Rfl:  .  prochlorperazine (COMPAZINE) 10 MG tablet, Take 1 tablet (10 mg total) by mouth every 6 (six) hours as needed (Nausea or vomiting). (Patient not taking: Reported on 06/18/2019), Disp: 30 tablet, Rfl: 1  Physical exam:  Vitals:   07/17/19 0910  BP: (!) 149/73  Pulse: 70  Resp: 16  Temp: 97.6 F (36.4 C)  TempSrc: Tympanic  Weight: 171 lb (77.6 kg)   Physical Exam Constitutional:      Comments: Sitting in a wheelchair.  Appears in no acute distress  Pulmonary:     Effort: Pulmonary  effort is normal.  Abdominal:     General: Bowel sounds are normal.     Palpations: Abdomen is soft.  Skin:    General: Skin is warm and dry.  Neurological:     Mental Status: She is alert and oriented to person, place, and time.      CMP Latest Ref Rng & Units 07/17/2019  Glucose 70 - 99 mg/dL 107(H)  BUN 8 - 23 mg/dL 9  Creatinine 0.44 - 1.00 mg/dL 0.92  Sodium 135 - 145 mmol/L 141  Potassium 3.5 - 5.1 mmol/L 3.2(L)  Chloride 98 - 111 mmol/L 109  CO2 22 - 32 mmol/L 24  Calcium 8.9 - 10.3 mg/dL 8.9  Total Protein 6.5 - 8.1 g/dL 6.7  Total Bilirubin 0.3 - 1.2 mg/dL 0.4  Alkaline Phos 38 - 126 U/L 99  AST 15 - 41 U/L 23  ALT 0 - 44 U/L 30   CBC Latest Ref Rng & Units 07/17/2019  WBC 4.0 - 10.5 K/uL 13.5(H)  Hemoglobin 12.0 - 15.0 g/dL 8.3(L)  Hematocrit 36.0 - 46.0 % 24.6(L)  Platelets 150 - 400 K/uL 168    No images are attached to the encounter.  MM CLIP PLACEMENT RIGHT  Result Date: 06/26/2019 CLINICAL DATA:  Post MRI guided biopsy of nodular linear enhancement in the lower inner far posterior right breast. EXAM: DIAGNOSTIC RIGHT MAMMOGRAM POST MRI BIOPSY COMPARISON:  Previous exam(s). FINDINGS: Mammographic images were obtained following MRI guided biopsy of nodular and linear enhancement within the lower inner far posterior right breast. Multiple attempts were made however the cylindrical shaped biopsy marking clip could not be identified. This is felt to have deployed properly and likely not visualized due to the far posteromedial location. IMPRESSION: Non visualization of the biopsy marking clip. This is felt to have deployed properly, however likely not visualized due to the far posteromedial location. If biopsy results return as abnormal/surgical, then recommend the patient return for placement of a HydroMARK clip with MRI guidance. Final Assessment: Post Procedure Mammograms for Marker Placement Electronically Signed   By: Everlean Alstrom M.D.   On: 06/26/2019 12:51   MR  RT BREAST BX W LOC DEV 1ST LESION IMAGE BX SPEC MR GUIDE  Addendum Date: 07/01/2019   ADDENDUM REPORT: 06/30/2019 08:16 ADDENDUM: Pathology revealed LOW GRADE DUCTAL CARCINOMA IN SITU WITH CALCIFICATIONS, PAPILLARY  FEATURES of the RIGHT breast, lower inner posterior. This was found to be concordant by Dr. Everlean Alstrom. Pathology results were discussed with the patient by telephone. The patient reported doing well after the biopsy with tenderness and swelling at the site. Post biopsy instructions and care were reviewed and questions were answered. The patient was encouraged to call [facility] for any additional concerns. The patient has a recent diagnosis of LEFT breast cancer and should follow her outlined treatment plan. Non visualization of the biopsy marking clip noted on post biopsy clip film image. This is felt to have deployed properly, however likely not visualized due to the far posteromedial location. Only the most anterior portion of the 5.3 cm linear oriented non mass enhancement in the lower inner right breast was biopsied due to the far posterior location. If breast conservation is being considered, then the patient would have to return for MRI guided placement of a HydroMARK clip at the biopsy site. If possible, placement of an additional HydroMARK clip at the most posterior portion of the linear non mass enhancement should be attempted, although it is doubtful this will be successful. This should be performed as soon as possible as the patient is currently undergoing neoadjuvant chemotherapy and the enhancement at site of biopsy proven DCIS may not be visible on MRI much longer. Following placement of HydroMARK biopsy marking clips, the patient will need to return to the breast Center for mammograms to see if the clips can be visualized mammographically. In addition, an ultrasound is recommended to document position of the [facility] clip sonographically. This  information was relayed to Tanya Nones, RN, Nurse Navigator at [facility] via [facility] message on June 27, 2019. Pathology results reported by Terie Purser, RN on 06/30/2019. Electronically Signed   By: Everlean Alstrom M.D.   On: 06/30/2019 08:16   Result Date: 07/01/2019 CLINICAL DATA:  64 year old female with linear nodular enhancement spanning 5.3 cm involving the lower inner posterior right breast. EXAM: MRI GUIDED CORE NEEDLE BIOPSY OF THE RIGHT BREAST TECHNIQUE: Multiplanar, multisequence MR imaging of the right breast was performed both before and after administration of intravenous contrast. CONTRAST:  11m GADAVIST GADOBUTROL 1 MMOL/ML IV SOLN COMPARISON:  Previous exams. FINDINGS: I met with the patient, and we discussed the procedure of MRI guided biopsy, including risks, benefits, and alternatives. Specifically, we discussed the risks of infection, bleeding, tissue injury, clip migration, and inadequate sampling. Informed, written consent was given. The usual time out protocol was performed immediately prior to the procedure. Using sterile technique, 1% Lidocaine, MRI guidance, and a 9 gauge vacuum assisted device, biopsy was performed of the linear enhancement within the lower inner far posterior right breast using a medial to lateral approach. At the conclusion of the procedure, a cylindrical shaped tissue marker clip was deployed into the biopsy cavity. Follow-up 2-view mammogram was performed and dictated separately. IMPRESSION: MRI guided biopsy of the linear nodular enhancement within the lower posterior inner right breast. MRI guided biopsy of anterior and posterior margins of this enhancement was recommended, however due to the far posterior location only 1 biopsy was performed. No apparent complications. Electronically Signed: By: JEverlean AlstromM.D. On: 06/26/2019 09:58     Assessment and plan- Patient is a 64y.o. female withclinical prognostic stage Ia invasive  mammary carcinoma cT1 ccN0 cM0 ER/PR negative and HER-2/neu negative.   She is here for on treatment assessment prior to cycle 1 of weekly carbotaxol chemotherapy.  She has completed dose dense AC chemotherapy  Prior to starting chemotherapy patient's hemoglobin was 11 which has gradually drifted down and is down to 8.3 today.  Iron studies are indicative of anemia of chronic disease and B12 and folate levels are normal.  I suspect her anemia is secondary to chemotherapy.  I will proceed with cycle 1 of weekly carbotaxol chemotherapy cautiously.  Carboplatin will be given with an ANC of 1.5.  If her anemia worsens I will have to withhold carboplatin altogether.  I will see her back in 1 week's time with CBC with differential, CMP for cycle 2 of carbotaxol chemotherapy.  Hold tube for possible transfusion.  Patient was not able to go for interim ultrasound.  She has multiple appointments next week but is agreeable to proceed with an ultrasound in 2 weeks which we will schedule.   Visit Diagnosis 1. Malignant neoplasm of lower-inner quadrant of left breast in female, estrogen receptor negative ([place])   2. Anemia due to antineoplastic chemotherapy   3. Encounter for antineoplastic chemotherapy      Dr. Randa Evens, MD, MPH [facility] at [facility] [phone] 07/18/2019 12:39 PM

## 2019-07-23 ENCOUNTER — Encounter: Payer: Self-pay | Admitting: Oncology

## 2019-07-23 NOTE — Progress Notes (Signed)
Patient is coming in for follow up she is doing well, she had a little diarrhea today but feels better nothing new

## 2019-07-24 ENCOUNTER — Inpatient Hospital Stay: Payer: BC Managed Care – PPO

## 2019-07-24 ENCOUNTER — Inpatient Hospital Stay (HOSPITAL_BASED_OUTPATIENT_CLINIC_OR_DEPARTMENT_OTHER): Payer: BC Managed Care – PPO | Admitting: Oncology

## 2019-07-24 ENCOUNTER — Other Ambulatory Visit: Payer: Self-pay

## 2019-07-24 VITALS — BP 124/71 | HR 80 | Temp 98.7°F | Ht 66.0 in | Wt 172.0 lb

## 2019-07-24 DIAGNOSIS — C50312 Malignant neoplasm of lower-inner quadrant of left female breast: Secondary | ICD-10-CM | POA: Diagnosis not present

## 2019-07-24 DIAGNOSIS — Z5111 Encounter for antineoplastic chemotherapy: Secondary | ICD-10-CM

## 2019-07-24 DIAGNOSIS — Z171 Estrogen receptor negative status [ER-]: Secondary | ICD-10-CM

## 2019-07-24 DIAGNOSIS — D6481 Anemia due to antineoplastic chemotherapy: Secondary | ICD-10-CM

## 2019-07-24 DIAGNOSIS — T451X5A Adverse effect of antineoplastic and immunosuppressive drugs, initial encounter: Secondary | ICD-10-CM

## 2019-07-24 DIAGNOSIS — E876 Hypokalemia: Secondary | ICD-10-CM | POA: Diagnosis not present

## 2019-07-24 LAB — CBC WITH DIFFERENTIAL/PLATELET
Abs Immature Granulocytes: 0.06 10*3/uL (ref 0.00–0.07)
Basophils Absolute: 0 10*3/uL (ref 0.0–0.1)
Basophils Relative: 0 %
Eosinophils Absolute: 0 10*3/uL (ref 0.0–0.5)
Eosinophils Relative: 0 %
HCT: 27.2 % — ABNORMAL LOW (ref 36.0–46.0)
Hemoglobin: 9.2 g/dL — ABNORMAL LOW (ref 12.0–15.0)
Immature Granulocytes: 1 %
Lymphocytes Relative: 15 %
Lymphs Abs: 0.7 10*3/uL (ref 0.7–4.0)
MCH: 28.6 pg (ref 26.0–34.0)
MCHC: 33.8 g/dL (ref 30.0–36.0)
MCV: 84.5 fL (ref 80.0–100.0)
Monocytes Absolute: 0.7 10*3/uL (ref 0.1–1.0)
Monocytes Relative: 14 %
Neutro Abs: 3.4 10*3/uL (ref 1.7–7.7)
Neutrophils Relative %: 70 %
Platelets: 293 10*3/uL (ref 150–400)
RBC: 3.22 MIL/uL — ABNORMAL LOW (ref 3.87–5.11)
RDW: 15.9 % — ABNORMAL HIGH (ref 11.5–15.5)
WBC: 4.9 10*3/uL (ref 4.0–10.5)
nRBC: 2.1 % — ABNORMAL HIGH (ref 0.0–0.2)

## 2019-07-24 LAB — COMPREHENSIVE METABOLIC PANEL
ALT: 32 U/L (ref 0–44)
AST: 23 U/L (ref 15–41)
Albumin: 3.8 g/dL (ref 3.5–5.0)
Alkaline Phosphatase: 79 U/L (ref 38–126)
Anion gap: 12 (ref 5–15)
BUN: 20 mg/dL (ref 8–23)
CO2: 25 mmol/L (ref 22–32)
Calcium: 9.5 mg/dL (ref 8.9–10.3)
Chloride: 101 mmol/L (ref 98–111)
Creatinine, Ser: 0.9 mg/dL (ref 0.44–1.00)
GFR calc Af Amer: >60 mL/min (ref >60)
GFR calc non Af Amer: >60 mL/min (ref >60)
Glucose, Bld: 128 mg/dL — ABNORMAL HIGH (ref 70–99)
Potassium: 2.7 mmol/L — CL (ref 3.5–5.1)
Sodium: 138 mmol/L (ref 135–145)
Total Bilirubin: 0.8 mg/dL (ref 0.3–1.2)
Total Protein: 7.2 g/dL (ref 6.5–8.1)

## 2019-07-24 LAB — SAMPLE TO BLOOD BANK

## 2019-07-24 MED ORDER — SODIUM CHLORIDE 0.9% FLUSH
10.0000 mL | Freq: Once | INTRAVENOUS | Status: AC
  Administered 2019-07-24: 10 mL via INTRAVENOUS
  Filled 2019-07-24: qty 10

## 2019-07-24 MED ORDER — SODIUM CHLORIDE 0.9 % IV SOLN
20.0000 mg | Freq: Once | INTRAVENOUS | Status: AC
  Administered 2019-07-24: 20 mg via INTRAVENOUS
  Filled 2019-07-24: qty 20

## 2019-07-24 MED ORDER — SODIUM CHLORIDE 0.9 % IV SOLN
80.0000 mg/m2 | Freq: Once | INTRAVENOUS | Status: AC
  Administered 2019-07-24: 150 mg via INTRAVENOUS
  Filled 2019-07-24: qty 25

## 2019-07-24 MED ORDER — HEPARIN SOD (PORK) LOCK FLUSH 100 UNIT/ML IV SOLN
INTRAVENOUS | Status: AC
  Filled 2019-07-24: qty 5

## 2019-07-24 MED ORDER — DIPHENHYDRAMINE HCL 50 MG/ML IJ SOLN
50.0000 mg | Freq: Once | INTRAMUSCULAR | Status: AC
  Administered 2019-07-24: 50 mg via INTRAVENOUS
  Filled 2019-07-24: qty 1

## 2019-07-24 MED ORDER — SODIUM CHLORIDE 0.9 % IV SOLN
Freq: Once | INTRAVENOUS | Status: AC
  Filled 2019-07-24: qty 1000

## 2019-07-24 MED ORDER — FAMOTIDINE IN NACL 20-0.9 MG/50ML-% IV SOLN
20.0000 mg | Freq: Once | INTRAVENOUS | Status: AC
  Administered 2019-07-24: 20 mg via INTRAVENOUS
  Filled 2019-07-24: qty 50

## 2019-07-24 MED ORDER — POTASSIUM CHLORIDE CRYS ER 20 MEQ PO TBCR: 20.0000 meq | EXTENDED_RELEASE_TABLET | Freq: Two times a day (BID) | ORAL | 3 refills | Status: DC

## 2019-07-24 MED ORDER — HEPARIN SOD (PORK) LOCK FLUSH 100 UNIT/ML IV SOLN
500.0000 [IU] | Freq: Once | INTRAVENOUS | Status: AC | PRN
  Administered 2019-07-24: 500 [IU]
  Filled 2019-07-24: qty 5

## 2019-07-24 MED ORDER — SODIUM CHLORIDE 0.9 % IV SOLN
160.0000 mg | Freq: Once | INTRAVENOUS | Status: AC
  Administered 2019-07-24: 160 mg via INTRAVENOUS
  Filled 2019-07-24: qty 16

## 2019-07-24 MED ORDER — LIDOCAINE-PRILOCAINE 2.5-2.5 % EX CREA: TOPICAL_CREAM | CUTANEOUS | 3 refills | Status: DC

## 2019-07-24 MED ORDER — SODIUM CHLORIDE 0.9 % IV SOLN
Freq: Once | INTRAVENOUS | Status: AC
  Filled 2019-07-24: qty 250

## 2019-07-24 NOTE — Progress Notes (Signed)
Pharmacist Chemotherapy Monitoring - Follow Up Assessment    I verify that I have reviewed each item in the below checklist:  . Regimen for the patient is scheduled for the appropriate day and plan matches scheduled date. Marland Kitchen Appropriate non-routine labs are ordered dependent on drug ordered. . If applicable, additional medications reviewed and ordered per protocol based on lifetime cumulative doses and/or treatment regimen.   Plan for follow-up and/or issues identified: No . I-vent associated with next due treatment: No . MD and/or nursing notified: No  Molly Walters K 07/24/2019 10:08 AM

## 2019-07-24 NOTE — Progress Notes (Signed)
Patient had an episode of loose stool yesterday. Patient then stated that she had been doing well with her bowel movements. Patient also stated that she had been feeling lightheaded this AM.

## 2019-07-25 NOTE — Progress Notes (Signed)
Hematology/Oncology Consult note [facility]  Telephone:([phone] [phone]  Patient Care Team: Lanny Hurst, MD as PCP - General (Internal Medicine) Rico Junker, RN as Registered Nurse Theodore Demark, RN as Registered Nurse   Name of the patient: Molly Walters  [id number]  1955/08/06   Date of visit: 07/25/19  Diagnosis- clinical prognostic stage Ia invasive mammary carcinoma cmT1 ccN0 cM0 ER/PR negative and HER-2/neu negative  Chief complaint/ Reason for visit-on treatment assessment prior to cycle 2 of weekly carbotaxol chemotherapy  Heme/Onc history: patient is a 64 year old African-American female with past medical history significant for hypertension. She has had prior breast cancer in 2001 s/p lumpectomy followed by adjuvant radiation treatment. She did not require adjuvant chemotherapy. She does not remember taking any endocrine therapy as well.More recently patient underwent a bilateral screening mammogram in November 2020 which showed a possible mass in the left breast. This was followed by a diagnostic mammogram and ultrasound which showed a 1.6 cm mass at the 7 o'clock position in the left breast near the lumpectomy scar. No left axillary adenopathy. 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 Patient has seen Dr. Gretchen Short referred to [place] for further management.Patient is independent of her ADLs and IADLs. Family history significant for prostate cancer in her maternal uncle. No other family history of breast, ovarian pancreatic colon cancer or melanoma.  MRI bilateral breast showed:The size of the primary left breast mass appears larger on MRI close to 2.4 x 1.9 x 1.7 cm. She is also noted to have 2 additional enhancing masses 9 x 6 x 10 mm and 5 x 5 x 5 mm in the left breast with suspicious washout kinetics. No pathological left axillary adenopathy.  Prominent lymph nodes seen on the right side along with non-mass enhancement spanning 5.3 cm in the AP dimension.Both the enhancing masses in the left breast were positive for triple negative breast cancer. Right sided axillary lymph node was negative for malignancy.Right breast biopsy showed DCIS  Interval history-tolerating chemotherapy well.  Has baseline fatigue.  She is hopeful that her brace will come out in the next 1 week and she can start ambulating normally.  ECOG PS- 1 Pain scale- 0   Review of systems- Review of Systems  Constitutional: Positive for malaise/fatigue. Negative for chills, fever and weight loss.  HENT: Negative for congestion, ear discharge and nosebleeds.   Eyes: Negative for blurred vision.  Respiratory: Negative for cough, hemoptysis, sputum production, shortness of breath and wheezing.   Cardiovascular: Negative for chest pain, palpitations, orthopnea and claudication.  Gastrointestinal: Negative for abdominal pain, blood in stool, constipation, diarrhea, heartburn, melena, nausea and vomiting.  Genitourinary: Negative for dysuria, flank pain, frequency, hematuria and urgency.  Musculoskeletal: Negative for back pain, joint pain and myalgias.  Skin: Negative for rash.  Neurological: Negative for dizziness, tingling, focal weakness, seizures, weakness and headaches.  Endo/Heme/Allergies: Does not bruise/bleed easily.  Psychiatric/Behavioral: Negative for depression and suicidal ideas. The patient does not have insomnia.       No Known Allergies   Past Medical History:  Diagnosis Date  . Breast cancer ([place]) 2001   left breast lumpectomy and rad tx  . Breast cancer ([place]) 02/2019   left breast mass w/ biopsy  . Hypertension   . Personal history of radiation therapy 2001   BREAST CA     Past Surgical History:  Procedure Laterality Date  . ABDOMINAL  HYSTERECTOMY    . BREAST BIOPSY Left 2001   POS  . BREAST BIOPSY Left 2021   positive  .  BREAST LUMPECTOMY Left 2001  . PORTACATH PLACEMENT Right 05/16/2019   Procedure: INSERTION PORT-A-CATH;  Surgeon: Jules Husbands, MD;  Location: [facility] ORS;  Service: General;  Laterality: Right;    Social History   Socioeconomic History  . Marital status: Divorced    Spouse name: Not on file  . Number of children: Not on file  . Years of education: Not on file  . Highest education level: Not on file  Occupational History  . Not on file  Tobacco Use  . Smoking status: Former Smoker    Packs/day: 0.50    Years: 5.00    Pack years: 2.50    Types: Cigarettes    Quit date: 1970    Years since quitting: 51.3  . Smokeless tobacco: Never Used  Substance and Sexual Activity  . Alcohol use: Never  . Drug use: Never  . Sexual activity: Not Currently  Other Topics Concern  . Not on file  Social History Narrative  . Not on file   Social Determinants of Health   Financial Resource Strain:   . Difficulty of Paying Living Expenses:   Food Insecurity:   . Worried About Charity fundraiser in the Last Year:   . Arboriculturist in the Last Year:   Transportation Needs:   . Film/video editor (Medical):   Marland Kitchen Lack of Transportation (Non-Medical):   Physical Activity:   . Days of Exercise per Week:   . Minutes of Exercise per Session:   Stress:   . Feeling of Stress :   Social Connections:   . Frequency of Communication with Friends and Family:   . Frequency of Social Gatherings with Friends and Family:   . Attends Religious Services:   . Active Member of Clubs or Organizations:   . Attends Archivist Meetings:   Marland Kitchen Marital Status:   Intimate Partner Violence:   . Fear of Current or Ex-Partner:   . Emotionally Abused:   Marland Kitchen Physically Abused:   . Sexually Abused:     Family History  Problem Relation Age of Onset  . Hypertension Mother   . Prostate cancer Paternal Uncle   . Breast cancer Neg Hx      Current Outpatient Medications:  .  acetaminophen (TYLENOL)  500 MG tablet, Take 500 mg by mouth every 6 (six) hours as needed., Disp: , Rfl:  .  cholecalciferol (VITAMIN D3) 25 MCG (1000 UNIT) tablet, Take 1,000 Units by mouth daily., Disp: , Rfl:  .  dexamethasone (DECADRON) 4 MG tablet, Take 2 tablets by mouth once a day on the day after chemotherapy and then take 2 tablets two times a day for 2 days. Take with food., Disp: 30 tablet, Rfl: 1 .  hydrochlorothiazide (HYDRODIURIL) 25 MG tablet, Take 25 mg by mouth daily., Disp: , Rfl:  .  latanoprost (XALATAN) 0.005 % ophthalmic solution, Place 1 drop into both eyes at bedtime. , Disp: , Rfl:  .  lidocaine-prilocaine (EMLA) cream, Apply to affected area once, Disp: 30 g, Rfl: 3 .  LORazepam (ATIVAN) 0.5 MG tablet, Take 1 tablet (0.5 mg total) by mouth every 6 (six) hours as needed (Nausea or vomiting)., Disp: 30 tablet, Rfl: 0 .  losartan (COZAAR) 100 MG tablet, Take 100 mg by mouth daily., Disp: , Rfl:  .  ondansetron (ZOFRAN) 8 MG  tablet, Take 1 tablet (8 mg total) by mouth 2 (two) times daily as needed. Start on the third day after chemotherapy., Disp: 30 tablet, Rfl: 1 .  pantoprazole (PROTONIX) 20 MG tablet, Take 1 tablet (20 mg total) by mouth daily., Disp: 30 tablet, Rfl: 3 .  potassium chloride SA (KLOR-CON) 20 MEQ tablet, Take 1 tablet (20 mEq total) by mouth 2 (two) times daily., Disp: 28 tablet, Rfl: 3 .  timolol (TIMOPTIC) 0.5 % ophthalmic solution, Place 1 drop into both eyes daily. , Disp: , Rfl:  .  prochlorperazine (COMPAZINE) 10 MG tablet, Take 1 tablet (10 mg total) by mouth every 6 (six) hours as needed (Nausea or vomiting). (Patient not taking: Reported on 06/18/2019), Disp: 30 tablet, Rfl: 1  Physical exam:  Vitals:   07/24/19 0838  BP: 124/71  Pulse: 80  Temp: 98.7 F (37.1 C)  TempSrc: Tympanic  SpO2: 100%  Weight: 172 lb (78 kg)  Height: _0  (1.676 m)   Physical Exam Constitutional:      Comments: Sitting in a wheelchair.  Has a right knee brace in place  Cardiovascular:      Rate and Rhythm: Normal rate and regular rhythm.     Heart sounds: Normal heart sounds.  Pulmonary:     Effort: Pulmonary effort is normal.     Breath sounds: Normal breath sounds.  Abdominal:     General: Bowel sounds are normal.     Palpations: Abdomen is soft.  Skin:    General: Skin is warm and dry.  Neurological:     Mental Status: She is alert and oriented to person, place, and time.      CMP Latest Ref Rng & Units 07/24/2019  Glucose 70 - 99 mg/dL 128(H)  BUN 8 - 23 mg/dL 20  Creatinine 0.44 - 1.00 mg/dL 0.90  Sodium 135 - 145 mmol/L 138  Potassium 3.5 - 5.1 mmol/L 2.7(LL)  Chloride 98 - 111 mmol/L 101  CO2 22 - 32 mmol/L 25  Calcium 8.9 - 10.3 mg/dL 9.5  Total Protein 6.5 - 8.1 g/dL 7.2  Total Bilirubin 0.3 - 1.2 mg/dL 0.8  Alkaline Phos 38 - 126 U/L 79  AST 15 - 41 U/L 23  ALT 0 - 44 U/L 32   CBC Latest Ref Rng & Units 07/24/2019  WBC 4.0 - 10.5 K/uL 4.9  Hemoglobin 12.0 - 15.0 g/dL 9.2(L)  Hematocrit 36.0 - 46.0 % 27.2(L)  Platelets 150 - 400 K/uL 293    No images are attached to the encounter.  MM CLIP PLACEMENT RIGHT  Result Date: 06/26/2019 CLINICAL DATA:  Post MRI guided biopsy of nodular linear enhancement in the lower inner far posterior right breast. EXAM: DIAGNOSTIC RIGHT MAMMOGRAM POST MRI BIOPSY COMPARISON:  Previous exam(s). FINDINGS: Mammographic images were obtained following MRI guided biopsy of nodular and linear enhancement within the lower inner far posterior right breast. Multiple attempts were made however the cylindrical shaped biopsy marking clip could not be identified. This is felt to have deployed properly and likely not visualized due to the far posteromedial location. IMPRESSION: Non visualization of the biopsy marking clip. This is felt to have deployed properly, however likely not visualized due to the far posteromedial location. If biopsy results return as abnormal/surgical, then recommend the patient return for placement of a HydroMARK  clip with MRI guidance. Final Assessment: Post Procedure Mammograms for Marker Placement Electronically Signed   By: Everlean Alstrom M.D.   On: 06/26/2019 12:51   MR  RT BREAST BX W LOC DEV 1ST LESION IMAGE BX SPEC MR GUIDE  Addendum Date: 07/01/2019   ADDENDUM REPORT: 06/30/2019 08:16 ADDENDUM: Pathology revealed LOW GRADE DUCTAL CARCINOMA IN SITU WITH CALCIFICATIONS, PAPILLARY FEATURES of the RIGHT breast, lower inner posterior. This was found to be concordant by Dr. Everlean Alstrom. Pathology results were discussed with the patient by telephone. The patient reported doing well after the biopsy with tenderness and swelling at the site. Post biopsy instructions and care were reviewed and questions were answered. The patient was encouraged to call [facility] for any additional concerns. The patient has a recent diagnosis of LEFT breast cancer and should follow her outlined treatment plan. Non visualization of the biopsy marking clip noted on post biopsy clip film image. This is felt to have deployed properly, however likely not visualized due to the far posteromedial location. Only the most anterior portion of the 5.3 cm linear oriented non mass enhancement in the lower inner right breast was biopsied due to the far posterior location. If breast conservation is being considered, then the patient would have to return for MRI guided placement of a HydroMARK clip at the biopsy site. If possible, placement of an additional HydroMARK clip at the most posterior portion of the linear non mass enhancement should be attempted, although it is doubtful this will be successful. This should be performed as soon as possible as the patient is currently undergoing neoadjuvant chemotherapy and the enhancement at site of biopsy proven DCIS may not be visible on MRI much longer. Following placement of HydroMARK biopsy marking clips, the patient will need to return to the breast Center for mammograms to  see if the clips can be visualized mammographically. In addition, an ultrasound is recommended to document position of the [facility] clip sonographically. This information was relayed to Tanya Nones, RN, Nurse Navigator at [facility] via [facility] message on June 27, 2019. Pathology results reported by Terie Purser, RN on 06/30/2019. Electronically Signed   By: Everlean Alstrom M.D.   On: 06/30/2019 08:16   Result Date: 07/01/2019 CLINICAL DATA:  64 year old female with linear nodular enhancement spanning 5.3 cm involving the lower inner posterior right breast. EXAM: MRI GUIDED CORE NEEDLE BIOPSY OF THE RIGHT BREAST TECHNIQUE: Multiplanar, multisequence MR imaging of the right breast was performed both before and after administration of intravenous contrast. CONTRAST:  65m GADAVIST GADOBUTROL 1 MMOL/ML IV SOLN COMPARISON:  Previous exams. FINDINGS: I met with the patient, and we discussed the procedure of MRI guided biopsy, including risks, benefits, and alternatives. Specifically, we discussed the risks of infection, bleeding, tissue injury, clip migration, and inadequate sampling. Informed, written consent was given. The usual time out protocol was performed immediately prior to the procedure. Using sterile technique, 1% Lidocaine, MRI guidance, and a 9 gauge vacuum assisted device, biopsy was performed of the linear enhancement within the lower inner far posterior right breast using a medial to lateral approach. At the conclusion of the procedure, a cylindrical shaped tissue marker clip was deployed into the biopsy cavity. Follow-up 2-view mammogram was performed and dictated separately. IMPRESSION: MRI guided biopsy of the linear nodular enhancement within the lower posterior inner right breast. MRI guided biopsy of anterior and posterior margins of this enhancement was recommended, however due to the far posterior location only 1 biopsy was performed. No apparent complications. Electronically  Signed: By: JEverlean AlstromM.D. On: 06/26/2019 09:58     Assessment and plan- Patient is a  64 y.o. female withclinical prognostic stage Ia invasive mammary carcinoma cT1 ccN0 cM0 ER/PR negative and HER-2/neu negative.She is here for on treatment assessment prior to cycle 2 of weekly carbotaxol chemotherapy\\  Patient is receiving carboplatin at reduced dose of AUC 1.5.  Her hemoglobin remained stable between 8 and 9.  She will proceed with cycle 2 of weekly carbotaxol chemotherapy today.  She will directly proceed for cycle 3 next week and I will see her back in 4 weeks for cycle 4.  She will be getting interim ultrasound to assess response to chemotherapy so far in the next 1 week.  Chemo-induced anemia:Ferritin iron studies B12 and folate were normal.  Counts likely to improve after chemotherapy is completed.  Continue to monitor  Hypokalemia: She sometimes misses her oral potassium doses.  I have renewed the prescription for her   Visit Diagnosis 1. Encounter for antineoplastic chemotherapy   2. Malignant neoplasm of lower-inner quadrant of left breast in female, estrogen receptor negative ([place])   3. Anemia due to antineoplastic chemotherapy   4. Hypokalemia      Dr. Randa Evens, MD, MPH [facility] at [facility] [phone] 07/25/2019 12:19 PM

## 2019-07-29 ENCOUNTER — Ambulatory Visit
Admission: RE | Admit: 2019-07-29 | Discharge: 2019-07-29 | Disposition: A | Discharge status: Home / Self Care | Payer: BC Managed Care – PPO | Source: Ambulatory Visit | Attending: Oncology | Admitting: Oncology

## 2019-07-29 DIAGNOSIS — Z171 Estrogen receptor negative status [ER-]: Secondary | ICD-10-CM | POA: Insufficient documentation

## 2019-07-29 DIAGNOSIS — C50312 Malignant neoplasm of lower-inner quadrant of left female breast: Secondary | ICD-10-CM | POA: Insufficient documentation

## 2019-07-31 ENCOUNTER — Other Ambulatory Visit: Payer: Self-pay

## 2019-07-31 ENCOUNTER — Other Ambulatory Visit: Payer: Self-pay | Admitting: Oncology

## 2019-07-31 ENCOUNTER — Inpatient Hospital Stay: Payer: BC Managed Care – PPO

## 2019-07-31 VITALS — BP 129/78 | HR 75 | Temp 98.8°F | Resp 20 | Wt 172.0 lb

## 2019-07-31 DIAGNOSIS — C50312 Malignant neoplasm of lower-inner quadrant of left female breast: Secondary | ICD-10-CM | POA: Diagnosis not present

## 2019-07-31 DIAGNOSIS — Z171 Estrogen receptor negative status [ER-]: Secondary | ICD-10-CM

## 2019-07-31 DIAGNOSIS — Z95828 Presence of other vascular implants and grafts: Secondary | ICD-10-CM

## 2019-07-31 LAB — CBC WITH DIFFERENTIAL/PLATELET
Abs Immature Granulocytes: 0.22 10*3/uL — ABNORMAL HIGH (ref 0.00–0.07)
Basophils Absolute: 0 10*3/uL (ref 0.0–0.1)
Basophils Relative: 0 %
Eosinophils Absolute: 0 10*3/uL (ref 0.0–0.5)
Eosinophils Relative: 0 %
HCT: 28 % — ABNORMAL LOW (ref 36.0–46.0)
Hemoglobin: 9.1 g/dL — ABNORMAL LOW (ref 12.0–15.0)
Immature Granulocytes: 3 %
Lymphocytes Relative: 10 %
Lymphs Abs: 0.7 10*3/uL (ref 0.7–4.0)
MCH: 28.3 pg (ref 26.0–34.0)
MCHC: 32.5 g/dL (ref 30.0–36.0)
MCV: 87.2 fL (ref 80.0–100.0)
Monocytes Absolute: 0.8 10*3/uL (ref 0.1–1.0)
Monocytes Relative: 12 %
Neutro Abs: 5.1 10*3/uL (ref 1.7–7.7)
Neutrophils Relative %: 75 %
Platelets: 272 10*3/uL (ref 150–400)
RBC: 3.21 MIL/uL — ABNORMAL LOW (ref 3.87–5.11)
RDW: 19.6 % — ABNORMAL HIGH (ref 11.5–15.5)
WBC: 6.8 10*3/uL (ref 4.0–10.5)
nRBC: 1.5 % — ABNORMAL HIGH (ref 0.0–0.2)

## 2019-07-31 LAB — COMPREHENSIVE METABOLIC PANEL
ALT: 22 U/L (ref 0–44)
AST: 14 U/L — ABNORMAL LOW (ref 15–41)
Albumin: 3.6 g/dL (ref 3.5–5.0)
Alkaline Phosphatase: 79 U/L (ref 38–126)
Anion gap: 10 (ref 5–15)
BUN: 13 mg/dL (ref 8–23)
CO2: 24 mmol/L (ref 22–32)
Calcium: 8.9 mg/dL (ref 8.9–10.3)
Chloride: 102 mmol/L (ref 98–111)
Creatinine, Ser: 0.76 mg/dL (ref 0.44–1.00)
GFR calc Af Amer: >60 mL/min (ref >60)
GFR calc non Af Amer: >60 mL/min (ref >60)
Glucose, Bld: 116 mg/dL — ABNORMAL HIGH (ref 70–99)
Potassium: 3.5 mmol/L (ref 3.5–5.1)
Sodium: 136 mmol/L (ref 135–145)
Total Bilirubin: 0.7 mg/dL (ref 0.3–1.2)
Total Protein: 6.6 g/dL (ref 6.5–8.1)

## 2019-07-31 MED ORDER — SODIUM CHLORIDE 0.9 % IV SOLN
160.0000 mg | Freq: Once | INTRAVENOUS | Status: AC
  Administered 2019-07-31: 160 mg via INTRAVENOUS
  Filled 2019-07-31: qty 16

## 2019-07-31 MED ORDER — SODIUM CHLORIDE 0.9 % IV SOLN
Freq: Once | INTRAVENOUS | Status: AC
  Filled 2019-07-31: qty 250

## 2019-07-31 MED ORDER — FAMOTIDINE IN NACL 20-0.9 MG/50ML-% IV SOLN
20.0000 mg | Freq: Once | INTRAVENOUS | Status: AC
  Administered 2019-07-31: 20 mg via INTRAVENOUS
  Filled 2019-07-31: qty 50

## 2019-07-31 MED ORDER — SODIUM CHLORIDE 0.9 % IV SOLN
20.0000 mg | Freq: Once | INTRAVENOUS | Status: AC
  Administered 2019-07-31: 20 mg via INTRAVENOUS
  Filled 2019-07-31: qty 20

## 2019-07-31 MED ORDER — SODIUM CHLORIDE 0.9% FLUSH
10.0000 mL | Freq: Once | INTRAVENOUS | Status: AC
  Administered 2019-07-31: 10 mL via INTRAVENOUS
  Filled 2019-07-31: qty 10

## 2019-07-31 MED ORDER — DIPHENHYDRAMINE HCL 50 MG/ML IJ SOLN
50.0000 mg | Freq: Once | INTRAMUSCULAR | Status: AC
  Administered 2019-07-31: 50 mg via INTRAVENOUS
  Filled 2019-07-31: qty 1

## 2019-07-31 MED ORDER — SODIUM CHLORIDE 0.9 % IV SOLN
80.0000 mg/m2 | Freq: Once | INTRAVENOUS | Status: AC
  Administered 2019-07-31: 150 mg via INTRAVENOUS
  Filled 2019-07-31: qty 25

## 2019-07-31 MED ORDER — HEPARIN SOD (PORK) LOCK FLUSH 100 UNIT/ML IV SOLN
500.0000 [IU] | Freq: Once | INTRAVENOUS | Status: AC | PRN
  Administered 2019-07-31: 500 [IU]
  Filled 2019-07-31: qty 5

## 2019-07-31 NOTE — Progress Notes (Signed)
Pharmacist Chemotherapy Monitoring - Follow Up Assessment    I verify that I have reviewed each item in the below checklist:  . Regimen for the patient is scheduled for the appropriate day and plan matches scheduled date. Marland Kitchen Appropriate non-routine labs are ordered dependent on drug ordered. . If applicable, additional medications reviewed and ordered per protocol based on lifetime cumulative doses and/or treatment regimen.   Plan for follow-up and/or issues identified: No . I-vent associated with next due treatment: No . MD and/or nursing notified: No  Molly Walters 07/31/2019 8:22 AM

## 2019-08-06 ENCOUNTER — Other Ambulatory Visit: Payer: Self-pay | Admitting: Oncology

## 2019-08-06 DIAGNOSIS — C50312 Malignant neoplasm of lower-inner quadrant of left female breast: Secondary | ICD-10-CM

## 2019-08-07 ENCOUNTER — Inpatient Hospital Stay: Payer: BC Managed Care – PPO | Admitting: Oncology

## 2019-08-07 ENCOUNTER — Inpatient Hospital Stay: Payer: BC Managed Care – PPO

## 2019-08-07 NOTE — Progress Notes (Signed)

## 2019-08-08 ENCOUNTER — Inpatient Hospital Stay (HOSPITAL_BASED_OUTPATIENT_CLINIC_OR_DEPARTMENT_OTHER): Payer: BC Managed Care – PPO | Admitting: Oncology

## 2019-08-08 ENCOUNTER — Other Ambulatory Visit: Payer: Self-pay

## 2019-08-08 ENCOUNTER — Inpatient Hospital Stay: Payer: BC Managed Care – PPO

## 2019-08-08 ENCOUNTER — Encounter: Payer: Self-pay | Admitting: Oncology

## 2019-08-08 VITALS — BP 107/78 | HR 76 | Temp 97.7°F | Resp 16 | Wt 169.0 lb

## 2019-08-08 DIAGNOSIS — D6481 Anemia due to antineoplastic chemotherapy: Secondary | ICD-10-CM | POA: Diagnosis not present

## 2019-08-08 DIAGNOSIS — C50312 Malignant neoplasm of lower-inner quadrant of left female breast: Secondary | ICD-10-CM | POA: Diagnosis not present

## 2019-08-08 DIAGNOSIS — T451X5A Adverse effect of antineoplastic and immunosuppressive drugs, initial encounter: Secondary | ICD-10-CM | POA: Diagnosis not present

## 2019-08-08 DIAGNOSIS — Z5111 Encounter for antineoplastic chemotherapy: Secondary | ICD-10-CM | POA: Diagnosis not present

## 2019-08-08 DIAGNOSIS — Z171 Estrogen receptor negative status [ER-]: Secondary | ICD-10-CM

## 2019-08-08 LAB — CBC WITH DIFFERENTIAL/PLATELET
Abs Immature Granulocytes: 0.04 10*3/uL (ref 0.00–0.07)
Basophils Absolute: 0 10*3/uL (ref 0.0–0.1)
Basophils Relative: 0 %
Eosinophils Absolute: 0 10*3/uL (ref 0.0–0.5)
Eosinophils Relative: 0 %
HCT: 25.9 % — ABNORMAL LOW (ref 36.0–46.0)
Hemoglobin: 8.6 g/dL — ABNORMAL LOW (ref 12.0–15.0)
Immature Granulocytes: 1 %
Lymphocytes Relative: 14 %
Lymphs Abs: 0.5 10*3/uL — ABNORMAL LOW (ref 0.7–4.0)
MCH: 29.4 pg (ref 26.0–34.0)
MCHC: 33.2 g/dL (ref 30.0–36.0)
MCV: 88.4 fL (ref 80.0–100.0)
Monocytes Absolute: 0.4 10*3/uL (ref 0.1–1.0)
Monocytes Relative: 11 %
Neutro Abs: 2.7 10*3/uL (ref 1.7–7.7)
Neutrophils Relative %: 74 %
Platelets: 165 10*3/uL (ref 150–400)
RBC: 2.93 MIL/uL — ABNORMAL LOW (ref 3.87–5.11)
RDW: 19.2 % — ABNORMAL HIGH (ref 11.5–15.5)
WBC: 3.7 10*3/uL — ABNORMAL LOW (ref 4.0–10.5)
nRBC: 0.5 % — ABNORMAL HIGH (ref 0.0–0.2)

## 2019-08-08 LAB — COMPREHENSIVE METABOLIC PANEL
ALT: 22 U/L (ref 0–44)
AST: 16 U/L (ref 15–41)
Albumin: 3.6 g/dL (ref 3.5–5.0)
Alkaline Phosphatase: 67 U/L (ref 38–126)
Anion gap: 10 (ref 5–15)
BUN: 19 mg/dL (ref 8–23)
CO2: 24 mmol/L (ref 22–32)
Calcium: 8.7 mg/dL — ABNORMAL LOW (ref 8.9–10.3)
Chloride: 101 mmol/L (ref 98–111)
Creatinine, Ser: 0.68 mg/dL (ref 0.44–1.00)
GFR calc Af Amer: >60 mL/min (ref >60)
GFR calc non Af Amer: >60 mL/min (ref >60)
Glucose, Bld: 98 mg/dL (ref 70–99)
Potassium: 3.4 mmol/L — ABNORMAL LOW (ref 3.5–5.1)
Sodium: 135 mmol/L (ref 135–145)
Total Bilirubin: 0.6 mg/dL (ref 0.3–1.2)
Total Protein: 6.5 g/dL (ref 6.5–8.1)

## 2019-08-08 MED ORDER — HEPARIN SOD (PORK) LOCK FLUSH 100 UNIT/ML IV SOLN
INTRAVENOUS | Status: AC
  Filled 2019-08-08: qty 5

## 2019-08-08 MED ORDER — DIPHENHYDRAMINE HCL 50 MG/ML IJ SOLN
50.0000 mg | Freq: Once | INTRAMUSCULAR | Status: AC
  Administered 2019-08-08: 10:00:00 50 mg via INTRAVENOUS
  Filled 2019-08-08: qty 1

## 2019-08-08 MED ORDER — SODIUM CHLORIDE 0.9 % IV SOLN
80.0000 mg/m2 | Freq: Once | INTRAVENOUS | Status: AC
  Administered 2019-08-08: 150 mg via INTRAVENOUS
  Filled 2019-08-08: qty 25

## 2019-08-08 MED ORDER — SODIUM CHLORIDE 0.9% FLUSH
10.0000 mL | INTRAVENOUS | Status: DC | PRN
  Administered 2019-08-08: 10 mL via INTRAVENOUS
  Filled 2019-08-08: qty 10

## 2019-08-08 MED ORDER — SODIUM CHLORIDE 0.9 % IV SOLN
160.0000 mg | Freq: Once | INTRAVENOUS | Status: AC
  Administered 2019-08-08: 160 mg via INTRAVENOUS
  Filled 2019-08-08: qty 16

## 2019-08-08 MED ORDER — FAMOTIDINE IN NACL 20-0.9 MG/50ML-% IV SOLN
20.0000 mg | Freq: Once | INTRAVENOUS | Status: AC
  Administered 2019-08-08: 20 mg via INTRAVENOUS
  Filled 2019-08-08: qty 50

## 2019-08-08 MED ORDER — HEPARIN SOD (PORK) LOCK FLUSH 100 UNIT/ML IV SOLN
500.0000 [IU] | Freq: Once | INTRAVENOUS | Status: AC
  Administered 2019-08-08: 500 [IU] via INTRAVENOUS
  Filled 2019-08-08: qty 5

## 2019-08-08 MED ORDER — SODIUM CHLORIDE 0.9 % IV SOLN
20.0000 mg | Freq: Once | INTRAVENOUS | Status: AC
  Administered 2019-08-08: 20 mg via INTRAVENOUS
  Filled 2019-08-08: qty 20

## 2019-08-08 MED ORDER — SODIUM CHLORIDE 0.9 % IV SOLN
Freq: Once | INTRAVENOUS | Status: AC
  Filled 2019-08-08: qty 250

## 2019-08-08 NOTE — Progress Notes (Signed)
Pt doing well, no concerns today. She is eating good, bowels are good. She did have 1 day and 1 time diarrhea earlier in the week an dtook imodium and it did not rtn. Rest of week is good with bowel. She still does not have her taste buds back but she can taste some things

## 2019-08-11 ENCOUNTER — Encounter: Payer: Self-pay | Admitting: Oncology

## 2019-08-11 NOTE — Progress Notes (Signed)
Hematology/Oncology Consult note [facility]  [phone] [phone]  Patient Care Team: Lanny Hurst, MD as PCP - General (Internal Medicine) Rico Junker, RN as Registered Nurse Theodore Demark, RN as Registered Nurse   Name of the patient: Molly Walters  [id number]  [birth year]   Date of visit: 08/11/19  Diagnosis-  clinical prognostic stage Ia invasive mammary carcinoma cmT1 ccN0 cM0 ER/PR negative and HER-2/neu negative   Chief complaint/ Reason for visit-on treatment assessment prior to cycle 4 of weekly carbotaxol chemotherapy  Heme/Onc history: patient is a 64 year old African-American female with past medical history significant for hypertension. She has had prior breast cancer in 2001 s/p lumpectomy followed by adjuvant radiation treatment. She did not require adjuvant chemotherapy. She does not remember taking any endocrine therapy as well.More recently patient underwent a bilateral screening mammogram in November 2020 which showed a possible mass in the left breast. This was followed by a diagnostic mammogram and ultrasound which showed a 1.6 cm mass at the 7 o'clock position in the left breast near the lumpectomy scar. No left axillary adenopathy. 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 Patient has seen Dr. Gretchen Short referred to [place] for further management.Patient is independent of her ADLs and IADLs. Family history significant for prostate cancer in her maternal uncle. No other family history of breast, ovarian pancreatic colon cancer or melanoma.  MRI bilateral breast showed:The size of the primary left breast mass appears larger on MRI close to 2.4 x 1.9 x 1.7 cm. She is also noted to have 2 additional enhancing masses 9 x 6 x 10 mm and 5 x 5 x 5 mm in the left breast with suspicious washout kinetics. No pathological left axillary adenopathy.  Prominent lymph nodes seen on the right side along with non-mass enhancement spanning 5.3 cm in the AP dimension.Both the enhancing masses in the left breast were positive for triple negative breast cancer. Right sided axillary lymph node was negative for malignancy.Right breast biopsy showed DCIS  Patient has completed neoadjuvant dose dense AC chemotherapy and is currently on weekly carbotaxol chemotherapy  Interval history-patient is currently tolerating chemotherapy well without any significant side effects.  She does have mild chronic fatigue which is at her baseline.  Denies any tingling numbness in her extremities.  She is able to ambulate better following her right knee injury.  Denies any falls  ECOG PS- 1 Pain scale- 0   Review of systems- Review of Systems  Constitutional: Positive for malaise/fatigue. Negative for chills, fever and weight loss.  HENT: Negative for congestion, ear discharge and nosebleeds.   Eyes: Negative for blurred vision.  Respiratory: Negative for cough, hemoptysis, sputum production, shortness of breath and wheezing.   Cardiovascular: Negative for chest pain, palpitations, orthopnea and claudication.  Gastrointestinal: Negative for abdominal pain, blood in stool, constipation, diarrhea, heartburn, melena, nausea and vomiting.  Genitourinary: Negative for dysuria, flank pain, frequency, hematuria and urgency.  Musculoskeletal: Negative for back pain, joint pain and myalgias.  Skin: Negative for rash.  Neurological: Negative for dizziness, tingling, focal weakness, seizures, weakness and headaches.  Endo/Heme/Allergies: Does not bruise/bleed easily.  Psychiatric/Behavioral: Negative for depression and suicidal ideas. The patient does not have insomnia.       No Known Allergies   Past Medical History:  Diagnosis Date  . Breast cancer ([place]) 2001   left breast lumpectomy and rad tx  . Breast cancer ([place]) 02/2019  left breast mass w/ biopsy  .  Hypertension   . Personal history of radiation therapy 2001   BREAST CA     Past Surgical History:  Procedure Laterality Date  . ABDOMINAL HYSTERECTOMY    . BREAST BIOPSY Left 2001   POS  . BREAST BIOPSY Left 2021   positive  . BREAST LUMPECTOMY Left 2001  . PORTACATH PLACEMENT Right 05/16/2019   Procedure: INSERTION PORT-A-CATH;  Surgeon: Jules Husbands, MD;  Location: [facility] ORS;  Service: General;  Laterality: Right;    Social History   Socioeconomic History  . Marital status: Divorced    Spouse name: Not on file  . Number of children: Not on file  . Years of education: Not on file  . Highest education level: Not on file  Occupational History  . Not on file  Tobacco Use  . Smoking status: Former Smoker    Packs/day: 0.50    Years: 5.00    Pack years: 2.50    Types: Cigarettes    Quit date: 1970    Years since quitting: 51.3  . Smokeless tobacco: Never Used  Substance and Sexual Activity  . Alcohol use: Never  . Drug use: Never  . Sexual activity: Not Currently  Other Topics Concern  . Not on file  Social History Narrative  . Not on file   Social Determinants of Health   Financial Resource Strain:   . Difficulty of Paying Living Expenses:   Food Insecurity:   . Worried About Charity fundraiser in the Last Year:   . Arboriculturist in the Last Year:   Transportation Needs:   . Film/video editor (Medical):   Marland Kitchen Lack of Transportation (Non-Medical):   Physical Activity:   . Days of Exercise per Week:   . Minutes of Exercise per Session:   Stress:   . Feeling of Stress :   Social Connections:   . Frequency of Communication with Friends and Family:   . Frequency of Social Gatherings with Friends and Family:   . Attends Religious Services:   . Active Member of Clubs or Organizations:   . Attends Archivist Meetings:   Marland Kitchen Marital Status:   Intimate Partner Violence:   . Fear of Current or Ex-Partner:   . Emotionally Abused:   Marland Kitchen Physically  Abused:   . Sexually Abused:     Family History  Problem Relation Age of Onset  . Hypertension Mother   . Prostate cancer Paternal Uncle   . Breast cancer Neg Hx      Current Outpatient Medications:  .  acetaminophen (TYLENOL) 500 MG tablet, Take 500 mg by mouth every 6 (six) hours as needed., Disp: , Rfl:  .  cholecalciferol (VITAMIN D3) 25 MCG (1000 UNIT) tablet, Take 1,000 Units by mouth daily., Disp: , Rfl:  .  dexamethasone (DECADRON) 4 MG tablet, TAKE 2 TABLETS BY MOUTH ONCE A DAY ON THE DAY AFTER CHEMOTHERAPY AND THEN 2 TABLETS TWICE DAILY FOR 2 DAYS. TAKE WITH FOOD., Disp: 30 tablet, Rfl: 11 .  hydrochlorothiazide (HYDRODIURIL) 25 MG tablet, Take 25 mg by mouth daily., Disp: , Rfl:  .  latanoprost (XALATAN) 0.005 % ophthalmic solution, Place 1 drop into both eyes at bedtime. , Disp: , Rfl:  .  lidocaine-prilocaine (EMLA) cream, Apply to affected area once, Disp: 30 g, Rfl: 3 .  loperamide (IMODIUM A-D) 2 MG tablet, Take 2 mg by mouth 4 (four) times daily as needed for  diarrhea or loose stools., Disp: , Rfl:  .  LORazepam (ATIVAN) 0.5 MG tablet, Take 1 tablet (0.5 mg total) by mouth every 6 (six) hours as needed (Nausea or vomiting)., Disp: 30 tablet, Rfl: 0 .  losartan (COZAAR) 100 MG tablet, Take 100 mg by mouth daily., Disp: , Rfl:  .  ondansetron (ZOFRAN) 8 MG tablet, Take 1 tablet (8 mg total) by mouth 2 (two) times daily as needed. Start on the third day after chemotherapy., Disp: 30 tablet, Rfl: 1 .  pantoprazole (PROTONIX) 20 MG tablet, Take 1 tablet (20 mg total) by mouth daily., Disp: 30 tablet, Rfl: 3 .  potassium chloride SA (KLOR-CON) 20 MEQ tablet, Take 1 tablet (20 mEq total) by mouth 2 (two) times daily., Disp: 28 tablet, Rfl: 3 .  timolol (TIMOPTIC) 0.5 % ophthalmic solution, Place 1 drop into both eyes daily. , Disp: , Rfl:  .  prochlorperazine (COMPAZINE) 10 MG tablet, Take 1 tablet (10 mg total) by mouth every 6 (six) hours as needed (Nausea or vomiting). (Patient  not taking: Reported on 06/18/2019), Disp: 30 tablet, Rfl: 1  Physical exam:  Vitals:   08/08/19 0849  BP: 107/78  Pulse: 76  Resp: 16  Temp: 97.7 F (36.5 C)  Weight: 169 lb (76.7 kg)   Physical Exam Constitutional:      General: She is not in acute distress. Cardiovascular:     Rate and Rhythm: Normal rate and regular rhythm.     Heart sounds: Normal heart sounds.  Pulmonary:     Effort: Pulmonary effort is normal.     Breath sounds: Normal breath sounds.  Abdominal:     General: Bowel sounds are normal.     Palpations: Abdomen is soft.  Skin:    General: Skin is warm and dry.  Neurological:     Mental Status: She is alert and oriented to person, place, and time.      CMP Latest Ref Rng & Units 08/08/2019  Glucose 70 - 99 mg/dL 98  BUN 8 - 23 mg/dL 19  Creatinine 0.44 - 1.00 mg/dL 0.68  Sodium 135 - 145 mmol/L 135  Potassium 3.5 - 5.1 mmol/L 3.4(L)  Chloride 98 - 111 mmol/L 101  CO2 22 - 32 mmol/L 24  Calcium 8.9 - 10.3 mg/dL 8.7(L)  Total Protein 6.5 - 8.1 g/dL 6.5  Total Bilirubin 0.3 - 1.2 mg/dL 0.6  Alkaline Phos 38 - 126 U/L 67  AST 15 - 41 U/L 16  ALT 0 - 44 U/L 22   CBC Latest Ref Rng & Units 08/08/2019  WBC 4.0 - 10.5 K/uL 3.7(L)  Hemoglobin 12.0 - 15.0 g/dL 8.6(L)  Hematocrit 36.0 - 46.0 % 25.9(L)  Platelets 150 - 400 K/uL 165    No images are attached to the encounter.  US Breast Limited Uni Left Inc Axilla  Result Date: 07/29/2019 CLINICAL DATA:  Follow-up known left breast cancer. EXAM: ULTRASOUND OF THE LEFT BREAST COMPARISON:  April 04, 2019 and May 30, 2019 FINDINGS: On physical exam, no suspicious lumps are identified. Targeted ultrasound is performed, showing no discrete mass in the region of the known malignancy at 11 o'clock, 10 cm from the nipple. The region around the biopsy clip measured in this location may simply represent normal tissue and there is no discrete mass in this location. The mass at 11 o'clock, 9 cm from the nipple  has resolved as well. The mass at 6:30, 2-3 cm from the nipple measures 10 x 6 by  10 mm today versus 16 x 14 by 14 mm previously. IMPRESSION: Interval improvement. The dominant mass at 6:30 in the left breast is smaller. The masses at 11 o'clock are not definitely seen. MRI would be more sensitive. RECOMMENDATION: Recommend continued surgical and oncologic follow up. I have discussed the findings and recommendations with the patient. If applicable, a reminder letter will be sent to the patient regarding the next appointment. BI-RADS CATEGORY  6: Known biopsy-proven malignancy. Electronically Signed   By: Dorise Bullion III M.D   On: 07/29/2019 12:08     Assessment and plan- Patient is a 64 y.o. female  withclinical prognostic stage Ia invasive mammary carcinoma cT1 ccN0 cM0 ER/PR negative and HER-2/neu negative. She is here for on treatment assessment prior to cycle 4 of weekly carbotaxol chemotherapy  Counts okay to proceed with cycle 4 of weekly carbotaxol chemotherapy today.  She is receiving carboplatin at a dose reduction of AUC 1.5 and receiving Taxol 80 mg per metered square.  She has mild leukopenia today with a white cell count of 3.7 but her ANC is2.7.  Anemia is currently stable.  Platelet counts are normal.  She will proceed with cycle 5 of weekly carbotaxol chemotherapy next week and depending on her white cell count I will decide if she would need weekly Neupogen with that cycle.  Chemo-induced anemia: Hemoglobin has slowly drifted down over the last 2 to 3 weeks but currently remained stable between 8-9 without any need for blood transfusions.  Iron studies B12 and folate are unremarkable.  This is therefore on chemo-induced anemia.  If her anemia worsens I will withhold carboplatin at that time  I will see her back in 2 weeks for cycle 6 of weekly carbotaxol chemotherapy   Visit Diagnosis 1. Encounter for antineoplastic chemotherapy   2. Antineoplastic chemotherapy induced anemia     3. Malignant neoplasm of lower-inner quadrant of left breast in female, estrogen receptor negative ([place])      Dr. Randa Evens, MD, MPH [facility] at [facility] [phone] 08/11/2019 8:15 AM

## 2019-08-14 ENCOUNTER — Telehealth: Payer: Self-pay | Admitting: *Deleted

## 2019-08-14 ENCOUNTER — Inpatient Hospital Stay: Payer: BC Managed Care – PPO

## 2019-08-14 NOTE — Telephone Encounter (Signed)
Dr Janese Banks said that the pt called and wanted soemthing for gas pain and she rec: that she try simethicone. I tried earlier this am and no answer and voicemail . Then I tried this afternoon and I got her and told her simethicone. It is over the counter . She will get it

## 2019-08-14 NOTE — Progress Notes (Signed)

## 2019-08-21 ENCOUNTER — Inpatient Hospital Stay (HOSPITAL_BASED_OUTPATIENT_CLINIC_OR_DEPARTMENT_OTHER): Payer: BC Managed Care – PPO | Admitting: Oncology

## 2019-08-21 ENCOUNTER — Inpatient Hospital Stay: Payer: BC Managed Care – PPO

## 2019-08-21 ENCOUNTER — Encounter: Payer: Self-pay | Admitting: Oncology

## 2019-08-21 ENCOUNTER — Other Ambulatory Visit: Payer: Self-pay

## 2019-08-21 ENCOUNTER — Inpatient Hospital Stay: Payer: BC Managed Care – PPO | Attending: Oncology

## 2019-08-21 VITALS — BP 148/89 | HR 86 | Temp 96.6°F | Resp 16 | Wt 171.1 lb

## 2019-08-21 DIAGNOSIS — Z5111 Encounter for antineoplastic chemotherapy: Secondary | ICD-10-CM

## 2019-08-21 DIAGNOSIS — D6481 Anemia due to antineoplastic chemotherapy: Secondary | ICD-10-CM | POA: Insufficient documentation

## 2019-08-21 DIAGNOSIS — Z171 Estrogen receptor negative status [ER-]: Secondary | ICD-10-CM

## 2019-08-21 DIAGNOSIS — G620 Drug-induced polyneuropathy: Secondary | ICD-10-CM | POA: Diagnosis not present

## 2019-08-21 DIAGNOSIS — R5381 Other malaise: Secondary | ICD-10-CM | POA: Diagnosis not present

## 2019-08-21 DIAGNOSIS — C50312 Malignant neoplasm of lower-inner quadrant of left female breast: Secondary | ICD-10-CM | POA: Insufficient documentation

## 2019-08-21 DIAGNOSIS — Z87891 Personal history of nicotine dependence: Secondary | ICD-10-CM | POA: Diagnosis not present

## 2019-08-21 DIAGNOSIS — I10 Essential (primary) hypertension: Secondary | ICD-10-CM | POA: Insufficient documentation

## 2019-08-21 DIAGNOSIS — T451X5A Adverse effect of antineoplastic and immunosuppressive drugs, initial encounter: Secondary | ICD-10-CM | POA: Diagnosis not present

## 2019-08-21 DIAGNOSIS — D701 Agranulocytosis secondary to cancer chemotherapy: Secondary | ICD-10-CM | POA: Diagnosis not present

## 2019-08-21 DIAGNOSIS — R5383 Other fatigue: Secondary | ICD-10-CM | POA: Diagnosis not present

## 2019-08-21 DIAGNOSIS — Z95828 Presence of other vascular implants and grafts: Secondary | ICD-10-CM

## 2019-08-21 DIAGNOSIS — Z7952 Long term (current) use of systemic steroids: Secondary | ICD-10-CM | POA: Diagnosis not present

## 2019-08-21 DIAGNOSIS — Z79899 Other long term (current) drug therapy: Secondary | ICD-10-CM | POA: Diagnosis not present

## 2019-08-21 DIAGNOSIS — E876 Hypokalemia: Secondary | ICD-10-CM | POA: Diagnosis not present

## 2019-08-21 LAB — CBC WITH DIFFERENTIAL/PLATELET
Abs Immature Granulocytes: 0.08 10*3/uL — ABNORMAL HIGH (ref 0.00–0.07)
Basophils Absolute: 0 10*3/uL (ref 0.0–0.1)
Basophils Relative: 0 %
Eosinophils Absolute: 0 10*3/uL (ref 0.0–0.5)
Eosinophils Relative: 0 %
HCT: 27.3 % — ABNORMAL LOW (ref 36.0–46.0)
Hemoglobin: 8.8 g/dL — ABNORMAL LOW (ref 12.0–15.0)
Immature Granulocytes: 2 %
Lymphocytes Relative: 19 %
Lymphs Abs: 0.8 10*3/uL (ref 0.7–4.0)
MCH: 29.1 pg (ref 26.0–34.0)
MCHC: 32.2 g/dL (ref 30.0–36.0)
MCV: 90.4 fL (ref 80.0–100.0)
Monocytes Absolute: 0.7 10*3/uL (ref 0.1–1.0)
Monocytes Relative: 17 %
Neutro Abs: 2.5 10*3/uL (ref 1.7–7.7)
Neutrophils Relative %: 62 %
Platelets: 184 10*3/uL (ref 150–400)
RBC: 3.02 MIL/uL — ABNORMAL LOW (ref 3.87–5.11)
RDW: 19.5 % — ABNORMAL HIGH (ref 11.5–15.5)
WBC: 4 10*3/uL (ref 4.0–10.5)
nRBC: 0.5 % — ABNORMAL HIGH (ref 0.0–0.2)

## 2019-08-21 LAB — COMPREHENSIVE METABOLIC PANEL
ALT: 21 U/L (ref 0–44)
AST: 17 U/L (ref 15–41)
Albumin: 3.6 g/dL (ref 3.5–5.0)
Alkaline Phosphatase: 71 U/L (ref 38–126)
Anion gap: 11 (ref 5–15)
BUN: 20 mg/dL (ref 8–23)
CO2: 23 mmol/L (ref 22–32)
Calcium: 8.8 mg/dL — ABNORMAL LOW (ref 8.9–10.3)
Chloride: 104 mmol/L (ref 98–111)
Creatinine, Ser: 0.69 mg/dL (ref 0.44–1.00)
GFR calc Af Amer: >60 mL/min (ref >60)
GFR calc non Af Amer: >60 mL/min (ref >60)
Glucose, Bld: 98 mg/dL (ref 70–99)
Potassium: 3.8 mmol/L (ref 3.5–5.1)
Sodium: 138 mmol/L (ref 135–145)
Total Bilirubin: 0.8 mg/dL (ref 0.3–1.2)
Total Protein: 6.7 g/dL (ref 6.5–8.1)

## 2019-08-21 MED ORDER — FAMOTIDINE IN NACL 20-0.9 MG/50ML-% IV SOLN
20.0000 mg | Freq: Once | INTRAVENOUS | Status: AC
  Administered 2019-08-21: 20 mg via INTRAVENOUS
  Filled 2019-08-21: qty 50

## 2019-08-21 MED ORDER — HEPARIN SOD (PORK) LOCK FLUSH 100 UNIT/ML IV SOLN
500.0000 [IU] | Freq: Once | INTRAVENOUS | Status: AC | PRN
  Administered 2019-08-21: 500 [IU]
  Filled 2019-08-21: qty 5

## 2019-08-21 MED ORDER — DIPHENHYDRAMINE HCL 50 MG/ML IJ SOLN
50.0000 mg | Freq: Once | INTRAMUSCULAR | Status: AC
  Administered 2019-08-21: 50 mg via INTRAVENOUS
  Filled 2019-08-21: qty 1

## 2019-08-21 MED ORDER — SODIUM CHLORIDE 0.9 % IV SOLN
80.0000 mg/m2 | Freq: Once | INTRAVENOUS | Status: AC
  Administered 2019-08-21: 150 mg via INTRAVENOUS
  Filled 2019-08-21: qty 25

## 2019-08-21 MED ORDER — SODIUM CHLORIDE 0.9 % IV SOLN
20.0000 mg | Freq: Once | INTRAVENOUS | Status: AC
  Administered 2019-08-21: 10:00:00 20 mg via INTRAVENOUS
  Filled 2019-08-21: qty 20

## 2019-08-21 MED ORDER — SODIUM CHLORIDE 0.9 % IV SOLN
Freq: Once | INTRAVENOUS | Status: AC
  Filled 2019-08-21: qty 250

## 2019-08-21 MED ORDER — SODIUM CHLORIDE 0.9 % IV SOLN
160.0000 mg | Freq: Once | INTRAVENOUS | Status: AC
  Administered 2019-08-21: 160 mg via INTRAVENOUS
  Filled 2019-08-21: qty 16

## 2019-08-21 MED ORDER — HEPARIN SOD (PORK) LOCK FLUSH 100 UNIT/ML IV SOLN
INTRAVENOUS | Status: AC
  Filled 2019-08-21: qty 5

## 2019-08-21 MED ORDER — SODIUM CHLORIDE 0.9% FLUSH
10.0000 mL | INTRAVENOUS | Status: DC | PRN
  Administered 2019-08-21: 10 mL via INTRAVENOUS
  Filled 2019-08-21: qty 10

## 2019-08-21 NOTE — Progress Notes (Signed)
Patient here for oncology follow-up appointment, expresses concerns of numbness in fingers. Otherwise no other complaints.

## 2019-08-22 ENCOUNTER — Ambulatory Visit: Payer: BC Managed Care – PPO

## 2019-08-22 ENCOUNTER — Other Ambulatory Visit: Payer: BC Managed Care – PPO

## 2019-08-22 ENCOUNTER — Ambulatory Visit: Payer: BC Managed Care – PPO | Admitting: Oncology

## 2019-08-22 NOTE — Progress Notes (Signed)
Hematology/Oncology Consult note [facility]  Telephone:([phone] [phone]  Patient Care Team: Lanny Hurst, MD as PCP - General (Internal Medicine) Rico Junker, RN as Registered Nurse Theodore Demark, RN as Registered Nurse   Name of the patient: Molly Walters  [id number]  1955/08/28   Date of visit: 08/22/19  Diagnosis- clinical prognostic stage Ia invasive mammary carcinoma cmT1 ccN0 cM0 ER/PR negative and HER-2/neu negative  Chief complaint/ Reason for visit-on treatment assessment prior to cycle 5 of weekly carbotaxol chemotherapy  Heme/Onc history: patient is a 64 year old African-American female with past medical history significant for hypertension. She has had prior breast cancer in 2001 s/p lumpectomy followed by adjuvant radiation treatment. She did not require adjuvant chemotherapy. She does not remember taking any endocrine therapy as well.More recently patient underwent a bilateral screening mammogram in November 2020 which showed a possible mass in the left breast. This was followed by a diagnostic mammogram and ultrasound which showed a 1.6 cm mass at the 7 o'clock position in the left breast near the lumpectomy scar. No left axillary adenopathy. 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 Patient has seen Dr. Gretchen Short referred to [place] for further management.Patient is independent of her ADLs and IADLs. Family history significant for prostate cancer in her maternal uncle. No other family history of breast, ovarian pancreatic colon cancer or melanoma.  MRI bilateral breast showed:The size of the primary left breast mass appears larger on MRI close to 2.4 x 1.9 x 1.7 cm. She is also noted to have 2 additional enhancing masses 9 x 6 x 10 mm and 5 x 5 x 5 mm in the left breast with suspicious washout kinetics. No pathological left axillary adenopathy.  Prominent lymph nodes seen on the right side along with non-mass enhancement spanning 5.3 cm in the AP dimension.Both the enhancing masses in the left breast were positive for triple negative breast cancer. Right sided axillary lymph node was negative for malignancy.Right breast biopsy showed DCIS  Patient has completed neoadjuvant dose dense AC chemotherapy and is currently on weekly carbotaxol chemotherapy  Interval history-patient canceled her chemotherapy last week.  She feels better after taking a break for 1 week.  She does report some fatigue and has ongoing issues with her right knee and still does not have complete stability while ambulating because of it.  ECOG PS- 1 Pain scale- 0   Review of systems- Review of Systems  Constitutional: Positive for malaise/fatigue. Negative for chills, fever and weight loss.  HENT: Negative for congestion, ear discharge and nosebleeds.   Eyes: Negative for blurred vision.  Respiratory: Negative for cough, hemoptysis, sputum production, shortness of breath and wheezing.   Cardiovascular: Negative for chest pain, palpitations, orthopnea and claudication.  Gastrointestinal: Negative for abdominal pain, blood in stool, constipation, diarrhea, heartburn, melena, nausea and vomiting.  Genitourinary: Negative for dysuria, flank pain, frequency, hematuria and urgency.  Musculoskeletal: Negative for back pain, joint pain and myalgias.  Skin: Negative for rash.  Neurological: Negative for dizziness, tingling, focal weakness, seizures, weakness and headaches.  Endo/Heme/Allergies: Does not bruise/bleed easily.  Psychiatric/Behavioral: Negative for depression and suicidal ideas. The patient does not have insomnia.       No Known Allergies   Past Medical History:  Diagnosis Date  . Breast cancer ([place]) 2001   left breast lumpectomy and rad tx  . Breast cancer ([place]) 02/2019   left breast mass w/  biopsy  . Hypertension   . Personal history of  radiation therapy 2001   BREAST CA     Past Surgical History:  Procedure Laterality Date  . ABDOMINAL HYSTERECTOMY    . BREAST BIOPSY Left 2001   POS  . BREAST BIOPSY Left 2021   positive  . BREAST LUMPECTOMY Left 2001  . PORTACATH PLACEMENT Right 05/16/2019   Procedure: INSERTION PORT-A-CATH;  Surgeon: Jules Husbands, MD;  Location: [facility] ORS;  Service: General;  Laterality: Right;    Social History   Socioeconomic History  . Marital status: Divorced    Spouse name: Not on file  . Number of children: Not on file  . Years of education: Not on file  . Highest education level: Not on file  Occupational History  . Not on file  Tobacco Use  . Smoking status: Former Smoker    Packs/day: 0.50    Years: 5.00    Pack years: 2.50    Types: Cigarettes    Quit date: 1970    Years since quitting: 51.3  . Smokeless tobacco: Never Used  Substance and Sexual Activity  . Alcohol use: Never  . Drug use: Never  . Sexual activity: Not Currently  Other Topics Concern  . Not on file  Social History Narrative  . Not on file   Social Determinants of Health   Financial Resource Strain:   . Difficulty of Paying Living Expenses:   Food Insecurity:   . Worried About Charity fundraiser in the Last Year:   . Arboriculturist in the Last Year:   Transportation Needs:   . Film/video editor (Medical):   Marland Kitchen Lack of Transportation (Non-Medical):   Physical Activity:   . Days of Exercise per Week:   . Minutes of Exercise per Session:   Stress:   . Feeling of Stress :   Social Connections:   . Frequency of Communication with Friends and Family:   . Frequency of Social Gatherings with Friends and Family:   . Attends Religious Services:   . Active Member of Clubs or Organizations:   . Attends Archivist Meetings:   Marland Kitchen Marital Status:   Intimate Partner Violence:   . Fear of Current or Ex-Partner:   . Emotionally Abused:   Marland Kitchen Physically Abused:   . Sexually Abused:      Family History  Problem Relation Age of Onset  . Hypertension Mother   . Prostate cancer Paternal Uncle   . Breast cancer Neg Hx      Current Outpatient Medications:  .  acetaminophen (TYLENOL) 500 MG tablet, Take 500 mg by mouth every 6 (six) hours as needed., Disp: , Rfl:  .  cholecalciferol (VITAMIN D3) 25 MCG (1000 UNIT) tablet, Take 1,000 Units by mouth daily., Disp: , Rfl:  .  dexamethasone (DECADRON) 4 MG tablet, TAKE 2 TABLETS BY MOUTH ONCE A DAY ON THE DAY AFTER CHEMOTHERAPY AND THEN 2 TABLETS TWICE DAILY FOR 2 DAYS. TAKE WITH FOOD., Disp: 30 tablet, Rfl: 11 .  hydrochlorothiazide (HYDRODIURIL) 25 MG tablet, Take 25 mg by mouth daily., Disp: , Rfl:  .  latanoprost (XALATAN) 0.005 % ophthalmic solution, Place 1 drop into both eyes at bedtime. , Disp: , Rfl:  .  lidocaine-prilocaine (EMLA) cream, Apply to affected area once, Disp: 30 g, Rfl: 3 .  loperamide (IMODIUM A-D) 2 MG tablet, Take 2 mg by mouth 4 (four) times daily as needed for diarrhea or loose stools.,  Disp: , Rfl:  .  LORazepam (ATIVAN) 0.5 MG tablet, Take 1 tablet (0.5 mg total) by mouth every 6 (six) hours as needed (Nausea or vomiting)., Disp: 30 tablet, Rfl: 0 .  losartan (COZAAR) 100 MG tablet, Take 100 mg by mouth daily., Disp: , Rfl:  .  ondansetron (ZOFRAN) 8 MG tablet, Take 1 tablet (8 mg total) by mouth 2 (two) times daily as needed. Start on the third day after chemotherapy., Disp: 30 tablet, Rfl: 1 .  pantoprazole (PROTONIX) 20 MG tablet, Take 1 tablet (20 mg total) by mouth daily., Disp: 30 tablet, Rfl: 3 .  potassium chloride SA (KLOR-CON) 20 MEQ tablet, Take 1 tablet (20 mEq total) by mouth 2 (two) times daily., Disp: 28 tablet, Rfl: 3 .  prochlorperazine (COMPAZINE) 10 MG tablet, Take 1 tablet (10 mg total) by mouth every 6 (six) hours as needed (Nausea or vomiting)., Disp: 30 tablet, Rfl: 1 .  timolol (TIMOPTIC) 0.5 % ophthalmic solution, Place 1 drop into both eyes daily. , Disp: , Rfl:   Physical  exam:  Vitals:   08/21/19 0912  BP: (!) 148/89  Pulse: 86  Resp: 16  Temp: (!) 96.6 F (35.9 C)  TempSrc: Tympanic  SpO2: 100%  Weight: 171 lb 1.6 oz (77.6 kg)   Physical Exam Constitutional:      General: She is not in acute distress.    Comments: She is sitting in a wheelchair.  Appears in no acute distress  Cardiovascular:     Rate and Rhythm: Normal rate and regular rhythm.     Heart sounds: Normal heart sounds.  Pulmonary:     Effort: Pulmonary effort is normal.     Breath sounds: Normal breath sounds.  Abdominal:     General: Bowel sounds are normal.     Palpations: Abdomen is soft.  Musculoskeletal:     Cervical back: Normal range of motion.  Skin:    General: Skin is warm and dry.  Neurological:     Mental Status: She is alert and oriented to person, place, and time.      CMP Latest Ref Rng & Units 08/21/2019  Glucose 70 - 99 mg/dL 98  BUN 8 - 23 mg/dL 20  Creatinine 0.44 - 1.00 mg/dL 0.69  Sodium 135 - 145 mmol/L 138  Potassium 3.5 - 5.1 mmol/L 3.8  Chloride 98 - 111 mmol/L 104  CO2 22 - 32 mmol/L 23  Calcium 8.9 - 10.3 mg/dL 8.8(L)  Total Protein 6.5 - 8.1 g/dL 6.7  Total Bilirubin 0.3 - 1.2 mg/dL 0.8  Alkaline Phos 38 - 126 U/L 71  AST 15 - 41 U/L 17  ALT 0 - 44 U/L 21   CBC Latest Ref Rng & Units 08/21/2019  WBC 4.0 - 10.5 K/uL 4.0  Hemoglobin 12.0 - 15.0 g/dL 8.8(L)  Hematocrit 36.0 - 46.0 % 27.3(L)  Platelets 150 - 400 K/uL 184    No images are attached to the encounter.  US Breast Limited Uni Left Inc Axilla  Result Date: 07/29/2019 CLINICAL DATA:  Follow-up known left breast cancer. EXAM: ULTRASOUND OF THE LEFT BREAST COMPARISON:  April 04, 2019 and May 30, 2019 FINDINGS: On physical exam, no suspicious lumps are identified. Targeted ultrasound is performed, showing no discrete mass in the region of the known malignancy at 11 o'clock, 10 cm from the nipple. The region around the biopsy clip measured in this location may simply represent  normal tissue and there is no discrete mass in this  location. The mass at 11 o'clock, 9 cm from the nipple has resolved as well. The mass at 6:30, 2-3 cm from the nipple measures 10 x 6 by 10 mm today versus 16 x 14 by 14 mm previously. IMPRESSION: Interval improvement. The dominant mass at 6:30 in the left breast is smaller. The masses at 11 o'clock are not definitely seen. MRI would be more sensitive. RECOMMENDATION: Recommend continued surgical and oncologic follow up. I have discussed the findings and recommendations with the patient. If applicable, a reminder letter will be sent to the patient regarding the next appointment. BI-RADS CATEGORY  6: Known biopsy-proven malignancy. Electronically Signed   By: Dorise Bullion III M.D   On: 07/29/2019 12:08     Assessment and plan- Patient is a 64 y.o. 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  She is here for on treatment assessment prior to cycle 5 of weekly carbotaxol chemotherapy    Patient has chemo-induced anemia but her hemoglobin is presently stable between 8-9.  Counts acceptable for cycle 5 of weekly carbotaxol chemotherapy today.  She will directly proceed for cycle 6 next week and I will see her back in 2 weeks for cycle 7. Patient is neutrophil counts are gradually trending low from 5.13 weeks ago to 2.7-2.5.  I am concerned she may become more neutropenic with next cycle.  I did attempt getting insurance approval for growth factor support which was denied until we have her counts next week.  Based on her [place] next week I will decide if she can get chemotherapy or not with growth factor support.  Patient reports mild neuropathy in her index finger on her left hand.  No neuropathy in other sites.  Continue to monitor   Visit Diagnosis 1. Encounter for antineoplastic chemotherapy   2. Chemotherapy induced neutropenia ([facility])   3. Antineoplastic chemotherapy induced  anemia   4. Chemotherapy-induced peripheral neuropathy ([place])   5. Malignant neoplasm of lower-inner quadrant of left breast in female, estrogen receptor negative ([place])      Dr. Randa Evens, MD, MPH [facility] [phone] 08/22/2019 1:55 PM

## 2019-08-28 ENCOUNTER — Inpatient Hospital Stay: Payer: BC Managed Care – PPO

## 2019-08-28 ENCOUNTER — Other Ambulatory Visit: Payer: Self-pay

## 2019-08-28 ENCOUNTER — Other Ambulatory Visit: Payer: Self-pay | Admitting: Oncology

## 2019-08-28 VITALS — BP 157/90 | HR 73 | Temp 97.5°F | Resp 20 | Wt 171.5 lb

## 2019-08-28 DIAGNOSIS — C50312 Malignant neoplasm of lower-inner quadrant of left female breast: Secondary | ICD-10-CM | POA: Diagnosis not present

## 2019-08-28 DIAGNOSIS — Z171 Estrogen receptor negative status [ER-]: Secondary | ICD-10-CM

## 2019-08-28 DIAGNOSIS — Z95828 Presence of other vascular implants and grafts: Secondary | ICD-10-CM

## 2019-08-28 LAB — CBC WITH DIFFERENTIAL/PLATELET
Abs Immature Granulocytes: 0.23 10*3/uL — ABNORMAL HIGH (ref 0.00–0.07)
Basophils Absolute: 0 10*3/uL (ref 0.0–0.1)
Basophils Relative: 0 %
Eosinophils Absolute: 0 10*3/uL (ref 0.0–0.5)
Eosinophils Relative: 0 %
HCT: 27.7 % — ABNORMAL LOW (ref 36.0–46.0)
Hemoglobin: 9.3 g/dL — ABNORMAL LOW (ref 12.0–15.0)
Immature Granulocytes: 5 %
Lymphocytes Relative: 17 %
Lymphs Abs: 0.8 10*3/uL (ref 0.7–4.0)
MCH: 30.1 pg (ref 26.0–34.0)
MCHC: 33.6 g/dL (ref 30.0–36.0)
MCV: 89.6 fL (ref 80.0–100.0)
Monocytes Absolute: 0.3 10*3/uL (ref 0.1–1.0)
Monocytes Relative: 5 %
Neutro Abs: 3.5 10*3/uL (ref 1.7–7.7)
Neutrophils Relative %: 73 %
Platelets: 249 10*3/uL (ref 150–400)
RBC: 3.09 MIL/uL — ABNORMAL LOW (ref 3.87–5.11)
RDW: 18.4 % — ABNORMAL HIGH (ref 11.5–15.5)
WBC: 4.8 10*3/uL (ref 4.0–10.5)
nRBC: 3.7 % — ABNORMAL HIGH (ref 0.0–0.2)

## 2019-08-28 LAB — COMPREHENSIVE METABOLIC PANEL
ALT: 20 U/L (ref 0–44)
AST: 16 U/L (ref 15–41)
Albumin: 3.5 g/dL (ref 3.5–5.0)
Alkaline Phosphatase: 81 U/L (ref 38–126)
Anion gap: 9 (ref 5–15)
BUN: 17 mg/dL (ref 8–23)
CO2: 24 mmol/L (ref 22–32)
Calcium: 9.2 mg/dL (ref 8.9–10.3)
Chloride: 101 mmol/L (ref 98–111)
Creatinine, Ser: 0.62 mg/dL (ref 0.44–1.00)
GFR calc Af Amer: >60 mL/min (ref >60)
GFR calc non Af Amer: >60 mL/min (ref >60)
Glucose, Bld: 116 mg/dL — ABNORMAL HIGH (ref 70–99)
Potassium: 3.5 mmol/L (ref 3.5–5.1)
Sodium: 134 mmol/L — ABNORMAL LOW (ref 135–145)
Total Bilirubin: 0.6 mg/dL (ref 0.3–1.2)
Total Protein: 6.7 g/dL (ref 6.5–8.1)

## 2019-08-28 MED ORDER — SODIUM CHLORIDE 0.9 % IV SOLN
160.0000 mg | Freq: Once | INTRAVENOUS | Status: AC
  Administered 2019-08-28: 160 mg via INTRAVENOUS
  Filled 2019-08-28: qty 16

## 2019-08-28 MED ORDER — HEPARIN SOD (PORK) LOCK FLUSH 100 UNIT/ML IV SOLN
INTRAVENOUS | Status: AC
  Filled 2019-08-28: qty 5

## 2019-08-28 MED ORDER — SODIUM CHLORIDE 0.9% FLUSH
10.0000 mL | INTRAVENOUS | Status: DC | PRN
  Administered 2019-08-28: 10 mL
  Filled 2019-08-28: qty 10

## 2019-08-28 MED ORDER — SODIUM CHLORIDE 0.9 % IV SOLN
Freq: Once | INTRAVENOUS | Status: AC
  Filled 2019-08-28: qty 250

## 2019-08-28 MED ORDER — FAMOTIDINE IN NACL 20-0.9 MG/50ML-% IV SOLN
20.0000 mg | Freq: Once | INTRAVENOUS | Status: AC
  Administered 2019-08-28: 20 mg via INTRAVENOUS
  Filled 2019-08-28: qty 50

## 2019-08-28 MED ORDER — SODIUM CHLORIDE 0.9 % IV SOLN
20.0000 mg | Freq: Once | INTRAVENOUS | Status: AC
  Administered 2019-08-28: 20 mg via INTRAVENOUS
  Filled 2019-08-28: qty 20

## 2019-08-28 MED ORDER — HEPARIN SOD (PORK) LOCK FLUSH 100 UNIT/ML IV SOLN
500.0000 [IU] | Freq: Once | INTRAVENOUS | Status: AC | PRN
  Administered 2019-08-28: 500 [IU]
  Filled 2019-08-28: qty 5

## 2019-08-28 MED ORDER — SODIUM CHLORIDE 0.9% FLUSH
10.0000 mL | INTRAVENOUS | Status: DC | PRN
  Administered 2019-08-28: 10 mL via INTRAVENOUS
  Filled 2019-08-28: qty 10

## 2019-08-28 MED ORDER — SODIUM CHLORIDE 0.9 % IV SOLN
80.0000 mg/m2 | Freq: Once | INTRAVENOUS | Status: AC
  Administered 2019-08-28: 150 mg via INTRAVENOUS
  Filled 2019-08-28: qty 25

## 2019-08-28 MED ORDER — DIPHENHYDRAMINE HCL 50 MG/ML IJ SOLN
50.0000 mg | Freq: Once | INTRAMUSCULAR | Status: AC
  Administered 2019-08-28: 50 mg via INTRAVENOUS
  Filled 2019-08-28: qty 1

## 2019-08-28 NOTE — Progress Notes (Signed)
Pharmacist Chemotherapy Monitoring - Follow Up Assessment    I verify that I have reviewed each item in the below checklist:  . Regimen for the patient is scheduled for the appropriate day and plan matches scheduled date. Marland Kitchen Appropriate non-routine labs are ordered dependent on drug ordered. . If applicable, additional medications reviewed and ordered per protocol based on lifetime cumulative doses and/or treatment regimen.   Plan for follow-up and/or issues identified: No . I-vent associated with next due treatment: No . MD and/or nursing notified: No  Molly Walters K 08/28/2019 9:37 AM

## 2019-08-28 NOTE — Progress Notes (Signed)
No filgrastim on 5/14 per Dr. Janese Banks

## 2019-08-29 ENCOUNTER — Inpatient Hospital Stay: Payer: BC Managed Care – PPO

## 2019-09-01 ENCOUNTER — Inpatient Hospital Stay: Payer: BC Managed Care – PPO

## 2019-09-02 ENCOUNTER — Inpatient Hospital Stay: Payer: BC Managed Care – PPO

## 2019-09-02 ENCOUNTER — Telehealth: Payer: Self-pay | Admitting: *Deleted

## 2019-09-02 ENCOUNTER — Encounter: Payer: Self-pay | Admitting: Oncology

## 2019-09-02 ENCOUNTER — Other Ambulatory Visit: Payer: Self-pay

## 2019-09-02 ENCOUNTER — Other Ambulatory Visit: Payer: Self-pay | Admitting: *Deleted

## 2019-09-02 ENCOUNTER — Inpatient Hospital Stay (HOSPITAL_BASED_OUTPATIENT_CLINIC_OR_DEPARTMENT_OTHER): Payer: BC Managed Care – PPO | Admitting: Oncology

## 2019-09-02 VITALS — BP 152/87 | HR 69 | Temp 98.1°F | Resp 16 | Wt 170.6 lb

## 2019-09-02 DIAGNOSIS — C50312 Malignant neoplasm of lower-inner quadrant of left female breast: Secondary | ICD-10-CM

## 2019-09-02 DIAGNOSIS — E876 Hypokalemia: Secondary | ICD-10-CM

## 2019-09-02 DIAGNOSIS — Z5189 Encounter for other specified aftercare: Secondary | ICD-10-CM

## 2019-09-02 DIAGNOSIS — Z5111 Encounter for antineoplastic chemotherapy: Secondary | ICD-10-CM

## 2019-09-02 DIAGNOSIS — R42 Dizziness and giddiness: Secondary | ICD-10-CM

## 2019-09-02 DIAGNOSIS — Z171 Estrogen receptor negative status [ER-]: Secondary | ICD-10-CM

## 2019-09-02 LAB — CBC WITH DIFFERENTIAL/PLATELET
Abs Immature Granulocytes: 0.05 10*3/uL (ref 0.00–0.07)
Basophils Absolute: 0 10*3/uL (ref 0.0–0.1)
Basophils Relative: 0 %
Eosinophils Absolute: 0 10*3/uL (ref 0.0–0.5)
Eosinophils Relative: 0 %
HCT: 27.8 % — ABNORMAL LOW (ref 36.0–46.0)
Hemoglobin: 9.3 g/dL — ABNORMAL LOW (ref 12.0–15.0)
Immature Granulocytes: 1 %
Lymphocytes Relative: 35 %
Lymphs Abs: 1.3 10*3/uL (ref 0.7–4.0)
MCH: 30.2 pg (ref 26.0–34.0)
MCHC: 33.5 g/dL (ref 30.0–36.0)
MCV: 90.3 fL (ref 80.0–100.0)
Monocytes Absolute: 0.2 10*3/uL (ref 0.1–1.0)
Monocytes Relative: 6 %
Neutro Abs: 2.1 10*3/uL (ref 1.7–7.7)
Neutrophils Relative %: 58 %
Platelets: 226 10*3/uL (ref 150–400)
RBC: 3.08 MIL/uL — ABNORMAL LOW (ref 3.87–5.11)
RDW: 18.6 % — ABNORMAL HIGH (ref 11.5–15.5)
WBC: 3.6 10*3/uL — ABNORMAL LOW (ref 4.0–10.5)
nRBC: 1.4 % — ABNORMAL HIGH (ref 0.0–0.2)

## 2019-09-02 LAB — COMPREHENSIVE METABOLIC PANEL
ALT: 22 U/L (ref 0–44)
AST: 18 U/L (ref 15–41)
Albumin: 3.7 g/dL (ref 3.5–5.0)
Alkaline Phosphatase: 69 U/L (ref 38–126)
Anion gap: 8 (ref 5–15)
BUN: 25 mg/dL — ABNORMAL HIGH (ref 8–23)
CO2: 23 mmol/L (ref 22–32)
Calcium: 9.1 mg/dL (ref 8.9–10.3)
Chloride: 104 mmol/L (ref 98–111)
Creatinine, Ser: 0.6 mg/dL (ref 0.44–1.00)
GFR calc Af Amer: >60 mL/min (ref >60)
GFR calc non Af Amer: >60 mL/min (ref >60)
Glucose, Bld: 129 mg/dL — ABNORMAL HIGH (ref 70–99)
Potassium: 2.9 mmol/L — ABNORMAL LOW (ref 3.5–5.1)
Sodium: 135 mmol/L (ref 135–145)
Total Bilirubin: 0.7 mg/dL (ref 0.3–1.2)
Total Protein: 6.8 g/dL (ref 6.5–8.1)

## 2019-09-02 LAB — SAMPLE TO BLOOD BANK

## 2019-09-02 MED ORDER — SODIUM CHLORIDE 0.9% FLUSH
10.0000 mL | Freq: Once | INTRAVENOUS | Status: AC
  Administered 2019-09-02: 10 mL via INTRAVENOUS
  Filled 2019-09-02: qty 10

## 2019-09-02 MED ORDER — SODIUM CHLORIDE 0.9 % IV SOLN
Freq: Once | INTRAVENOUS | Status: AC
  Filled 2019-09-02: qty 1000

## 2019-09-02 MED ORDER — HEPARIN SOD (PORK) LOCK FLUSH 100 UNIT/ML IV SOLN
INTRAVENOUS | Status: AC
  Filled 2019-09-02: qty 5

## 2019-09-02 MED ORDER — HEPARIN SOD (PORK) LOCK FLUSH 100 UNIT/ML IV SOLN
500.0000 [IU] | Freq: Once | INTRAVENOUS | Status: AC
  Administered 2019-09-02: 500 [IU] via INTRAVENOUS
  Filled 2019-09-02: qty 5

## 2019-09-02 MED ORDER — POTASSIUM CHLORIDE CRYS ER 20 MEQ PO TBCR: 20.0000 meq | EXTENDED_RELEASE_TABLET | Freq: Three times a day (TID) | ORAL | 3 refills | Status: DC

## 2019-09-02 NOTE — Progress Notes (Signed)
Patient here for oncology follow-up appointment, concerns of new dizziness which started 2 days ago.

## 2019-09-02 NOTE — Telephone Encounter (Signed)
Patient called reporting that for the past 3 days she has had dizzy spells causing her to have to lie down. She denies any other symptoms, no nausea, vomiting or diarrhea. She states she is eating and drinking well. Her last HGB on 5/13 was 9.3. Please advise

## 2019-09-02 NOTE — Telephone Encounter (Signed)
I can see her today and see check her labs and see if she needs fluids. You can tell her to come this morning if possible

## 2019-09-02 NOTE — Telephone Encounter (Signed)
Patient accepts appointment for this morning and will try to be there by 10 or shortly thereafter

## 2019-09-04 ENCOUNTER — Inpatient Hospital Stay: Payer: BC Managed Care – PPO

## 2019-09-04 ENCOUNTER — Other Ambulatory Visit: Payer: Self-pay | Admitting: Oncology

## 2019-09-04 ENCOUNTER — Other Ambulatory Visit: Payer: Self-pay

## 2019-09-04 ENCOUNTER — Inpatient Hospital Stay: Payer: BC Managed Care – PPO | Admitting: Oncology

## 2019-09-04 VITALS — BP 149/83 | HR 84 | Temp 98.9°F | Resp 20 | Wt 170.0 lb

## 2019-09-04 DIAGNOSIS — C50312 Malignant neoplasm of lower-inner quadrant of left female breast: Secondary | ICD-10-CM | POA: Diagnosis not present

## 2019-09-04 DIAGNOSIS — Z171 Estrogen receptor negative status [ER-]: Secondary | ICD-10-CM

## 2019-09-04 MED ORDER — HEPARIN SOD (PORK) LOCK FLUSH 100 UNIT/ML IV SOLN
INTRAVENOUS | Status: AC
  Filled 2019-09-04: qty 5

## 2019-09-04 MED ORDER — SODIUM CHLORIDE 0.9 % IV SOLN
Freq: Once | INTRAVENOUS | Status: AC
  Filled 2019-09-04: qty 250

## 2019-09-04 MED ORDER — DIPHENHYDRAMINE HCL 50 MG/ML IJ SOLN
50.0000 mg | Freq: Once | INTRAMUSCULAR | Status: AC
  Administered 2019-09-04: 50 mg via INTRAVENOUS
  Filled 2019-09-04: qty 1

## 2019-09-04 MED ORDER — FAMOTIDINE IN NACL 20-0.9 MG/50ML-% IV SOLN
20.0000 mg | Freq: Once | INTRAVENOUS | Status: AC
  Administered 2019-09-04: 20 mg via INTRAVENOUS
  Filled 2019-09-04: qty 50

## 2019-09-04 MED ORDER — SODIUM CHLORIDE 0.9 % IV SOLN
80.0000 mg/m2 | Freq: Once | INTRAVENOUS | Status: AC
  Administered 2019-09-04: 150 mg via INTRAVENOUS
  Filled 2019-09-04: qty 25

## 2019-09-04 MED ORDER — SODIUM CHLORIDE 0.9 % IV SOLN
160.0000 mg | Freq: Once | INTRAVENOUS | Status: AC
  Administered 2019-09-04: 160 mg via INTRAVENOUS
  Filled 2019-09-04: qty 16

## 2019-09-04 MED ORDER — HEPARIN SOD (PORK) LOCK FLUSH 100 UNIT/ML IV SOLN
500.0000 [IU] | Freq: Once | INTRAVENOUS | Status: AC | PRN
  Administered 2019-09-04: 500 [IU]
  Filled 2019-09-04: qty 5

## 2019-09-04 MED ORDER — SODIUM CHLORIDE 0.9 % IV SOLN
20.0000 mg | Freq: Once | INTRAVENOUS | Status: AC
  Administered 2019-09-04: 20 mg via INTRAVENOUS
  Filled 2019-09-04: qty 20

## 2019-09-04 MED ORDER — SODIUM CHLORIDE 0.9% FLUSH
10.0000 mL | INTRAVENOUS | Status: DC | PRN
  Administered 2019-09-04: 10 mL
  Filled 2019-09-04: qty 10

## 2019-09-04 NOTE — Progress Notes (Signed)
Pharmacist Chemotherapy Monitoring - Follow Up Assessment    I verify that I have reviewed each item in the below checklist:  . Regimen for the patient is scheduled for the appropriate day and plan matches scheduled date. Marland Kitchen Appropriate non-routine labs are ordered dependent on drug ordered. . If applicable, additional medications reviewed and ordered per protocol based on lifetime cumulative doses and/or treatment regimen.   Plan for follow-up and/or issues identified: No . I-vent associated with next due treatment: No . MD and/or nursing notified: No  Molly Walters K 09/04/2019 8:38 AM

## 2019-09-04 NOTE — Progress Notes (Signed)
Hematology/Oncology Consult note [facility]  [phone] [phone]  Patient Care Team: Lanny Hurst, MD as PCP - General (Internal Medicine) Rico Junker, RN as Registered Nurse Theodore Demark, RN as Registered Nurse   Name of the patient: Molly Walters  [id number]  1955/09/03   Date of visit: 09/04/19  Diagnosis- clinical prognostic stage Ia invasive mammary carcinoma cmT1 ccN0 cM0 ER/PR negative and HER-2/neu negative   Chief complaint/ Reason for visit-acute visit for episodes of dizziness. On treatment assessment prior to cycle 7 of weekly carbotaxol chemotherapy  Heme/Onc history: patient is a 64 year old African-American female with past medical history significant for hypertension. She has had prior breast cancer in 2001 s/p lumpectomy followed by adjuvant radiation treatment. She did not require adjuvant chemotherapy. She does not remember taking any endocrine therapy as well.More recently patient underwent a bilateral screening mammogram in November 2020 which showed a possible mass in the left breast. This was followed by a diagnostic mammogram and ultrasound which showed a 1.6 cm mass at the 7 o'clock position in the left breast near the lumpectomy scar. No left axillary adenopathy. 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 Patient has seen Dr. Gretchen Short referred to [place] for further management.Patient is independent of her ADLs and IADLs. Family history significant for prostate cancer in her maternal uncle. No other family history of breast, ovarian pancreatic colon cancer or melanoma.  MRI bilateral breast showed:The size of the primary left breast mass appears larger on MRI close to 2.4 x 1.9 x 1.7 cm. She is also noted to have 2 additional enhancing masses 9 x 6 x 10 mm and 5 x 5 x 5 mm in the left breast with suspicious washout kinetics. No  pathological left axillary adenopathy. Prominent lymph nodes seen on the right side along with non-mass enhancement spanning 5.3 cm in the AP dimension.Both the enhancing masses in the left breast were positive for triple negative breast cancer. Right sided axillary lymph node was negative for malignancy.Right breast biopsy showed DCIS  Patient has completed neoadjuvant dose dense AC chemotherapy and is currently on weekly carbotaxol chemotherapy  Interval history-patient reports feeling lightheaded which happened the day after and a couple of other times after chemotherapy last week.  She had to lay down for some time before the episode went away.  Denies any headache chest pain shortness of breath or focal tingling numbness or weakness during these episodes.  Denies any slurred speech or episodes of confusion.  ECOG PS- 1 Pain scale- 0   Review of systems- Review of Systems  Constitutional: Positive for malaise/fatigue. Negative for chills, fever and weight loss.  HENT: Negative for congestion, ear discharge and nosebleeds.   Eyes: Negative for blurred vision.  Respiratory: Negative for cough, hemoptysis, sputum production, shortness of breath and wheezing.   Cardiovascular: Negative for chest pain, palpitations, orthopnea and claudication.  Gastrointestinal: Negative for abdominal pain, blood in stool, constipation, diarrhea, heartburn, melena, nausea and vomiting.  Genitourinary: Negative for dysuria, flank pain, frequency, hematuria and urgency.  Musculoskeletal: Negative for back pain, joint pain and myalgias.  Skin: Negative for rash.  Neurological: Positive for dizziness. Negative for tingling, focal weakness, seizures, weakness and headaches.  Endo/Heme/Allergies: Does not bruise/bleed easily.  Psychiatric/Behavioral: Negative for depression and suicidal ideas. The patient does not have insomnia.        No Known Allergies   Past Medical History:  Diagnosis  Date  .  Breast cancer [facility]) 2001   left breast lumpectomy and rad tx  . Breast cancer ([place]) 02/2019   left breast mass w/ biopsy  . Hypertension   . Personal history of radiation therapy 2001   BREAST CA     Past Surgical History:  Procedure Laterality Date  . ABDOMINAL HYSTERECTOMY    . BREAST BIOPSY Left 2001   POS  . BREAST BIOPSY Left 2021   positive  . BREAST LUMPECTOMY Left 2001  . PORTACATH PLACEMENT Right 05/16/2019   Procedure: INSERTION PORT-A-CATH;  Surgeon: Jules Husbands, MD;  Location: [facility] ORS;  Service: General;  Laterality: Right;    Social History   Socioeconomic History  . Marital status: Divorced    Spouse name: Not on file  . Number of children: Not on file  . Years of education: Not on file  . Highest education level: Not on file  Occupational History  . Not on file  Tobacco Use  . Smoking status: Former Smoker    Packs/day: 0.50    Years: 5.00    Pack years: 2.50    Types: Cigarettes    Quit date: 1970    Years since quitting: 51.4  . Smokeless tobacco: Never Used  Substance and Sexual Activity  . Alcohol use: Never  . Drug use: Never  . Sexual activity: Not Currently  Other Topics Concern  . Not on file  Social History Narrative  . Not on file   Social Determinants of Health   Financial Resource Strain:   . Difficulty of Paying Living Expenses:   Food Insecurity:   . Worried About Charity fundraiser in the Last Year:   . Arboriculturist in the Last Year:   Transportation Needs:   . Film/video editor (Medical):   Marland Kitchen Lack of Transportation (Non-Medical):   Physical Activity:   . Days of Exercise per Week:   . Minutes of Exercise per Session:   Stress:   . Feeling of Stress :   Social Connections:   . Frequency of Communication with Friends and Family:   . Frequency of Social Gatherings with Friends and Family:   . Attends Religious Services:   . Active Member of Clubs or Organizations:   . Attends Archivist Meetings:     Marland Kitchen Marital Status:   Intimate Partner Violence:   . Fear of Current or Ex-Partner:   . Emotionally Abused:   Marland Kitchen Physically Abused:   . Sexually Abused:     Family History  Problem Relation Age of Onset  . Hypertension Mother   . Prostate cancer Paternal Uncle   . Breast cancer Neg Hx      Current Outpatient Medications:  .  acetaminophen (TYLENOL) 500 MG tablet, Take 500 mg by mouth every 6 (six) hours as needed., Disp: , Rfl:  .  cholecalciferol (VITAMIN D3) 25 MCG (1000 UNIT) tablet, Take 1,000 Units by mouth daily., Disp: , Rfl:  .  dexamethasone (DECADRON) 4 MG tablet, TAKE 2 TABLETS BY MOUTH ONCE A DAY ON THE DAY AFTER CHEMOTHERAPY AND THEN 2 TABLETS TWICE DAILY FOR 2 DAYS. TAKE WITH FOOD., Disp: 30 tablet, Rfl: 11 .  hydrochlorothiazide (HYDRODIURIL) 25 MG tablet, Take 25 mg by mouth daily., Disp: , Rfl:  .  latanoprost (XALATAN) 0.005 % ophthalmic solution, Place 1 drop into both eyes at bedtime. , Disp: , Rfl:  .  lidocaine-prilocaine (EMLA) cream, Apply to affected area once, Disp:  30 g, Rfl: 3 .  loperamide (IMODIUM A-D) 2 MG tablet, Take 2 mg by mouth 4 (four) times daily as needed for diarrhea or loose stools., Disp: , Rfl:  .  LORazepam (ATIVAN) 0.5 MG tablet, Take 1 tablet (0.5 mg total) by mouth every 6 (six) hours as needed (Nausea or vomiting)., Disp: 30 tablet, Rfl: 0 .  losartan (COZAAR) 100 MG tablet, Take 100 mg by mouth daily., Disp: , Rfl:  .  ondansetron (ZOFRAN) 8 MG tablet, Take 1 tablet (8 mg total) by mouth 2 (two) times daily as needed. Start on the third day after chemotherapy., Disp: 30 tablet, Rfl: 1 .  pantoprazole (PROTONIX) 20 MG tablet, Take 1 tablet (20 mg total) by mouth daily., Disp: 30 tablet, Rfl: 3 .  prochlorperazine (COMPAZINE) 10 MG tablet, Take 1 tablet (10 mg total) by mouth every 6 (six) hours as needed (Nausea or vomiting)., Disp: 30 tablet, Rfl: 1 .  timolol (TIMOPTIC) 0.5 % ophthalmic solution, Place 1 drop into both eyes daily. , Disp:  , Rfl:  .  potassium chloride SA (KLOR-CON) 20 MEQ tablet, Take 1 tablet (20 mEq total) by mouth 3 (three) times daily., Disp: 90 tablet, Rfl: 3 No current facility-administered medications for this visit.  Facility-Administered Medications Ordered in Other Visits:  .  CARBOplatin (PARAPLATIN) 160 mg in sodium chloride 0.9 % 100 mL chemo infusion, 160 mg, Intravenous, Once, Sindy Guadeloupe, MD .  heparin lock flush 100 unit/mL, 500 Units, Intracatheter, Once PRN, Sindy Guadeloupe, MD .  PACLitaxel (TAXOL) 150 mg in sodium chloride 0.9 % 250 mL chemo infusion (</= 71m/m2), 80 mg/m2 (Treatment Plan Recorded), Intravenous, Once, RSindy Guadeloupe MD, Last Rate: 275 mL/hr at 09/04/19 1126, 150 mg at 09/04/19 1126 .  sodium chloride flush (NS) 0.9 % injection 10 mL, 10 mL, Intracatheter, PRN, RSindy Guadeloupe MD, 10 mL at 09/04/19 1038  Physical exam:  Vitals:   09/02/19 1111 09/02/19 1112  BP: (!) 140/94 (!) 152/87  Pulse: 76 69  Resp: 16   Temp: 98.1 F (36.7 C)   TempSrc: Tympanic   SpO2: 97%   Weight: 170 lb 9.6 oz (77.4 kg)    Physical Exam Constitutional:      General: She is not in acute distress. Cardiovascular:     Rate and Rhythm: Normal rate and regular rhythm.     Heart sounds: Normal heart sounds.  Pulmonary:     Effort: Pulmonary effort is normal.     Breath sounds: Normal breath sounds.  Abdominal:     General: Bowel sounds are normal.     Palpations: Abdomen is soft.  Musculoskeletal:     Cervical back: Normal range of motion.  Skin:    General: Skin is warm and dry.  Neurological:     Mental Status: She is oriented to person, place, and time.      CMP Latest Ref Rng & Units 09/02/2019  Glucose 70 - 99 mg/dL 129(H)  BUN 8 - 23 mg/dL 25(H)  Creatinine 0.44 - 1.00 mg/dL 0.60  Sodium 135 - 145 mmol/L 135  Potassium 3.5 - 5.1 mmol/L 2.9(L)  Chloride 98 - 111 mmol/L 104  CO2 22 - 32 mmol/L 23  Calcium 8.9 - 10.3 mg/dL 9.1  Total Protein 6.5 - 8.1 g/dL 6.8  Total  Bilirubin 0.3 - 1.2 mg/dL 0.7  Alkaline Phos 38 - 126 U/L 69  AST 15 - 41 U/L 18  ALT 0 - 44 U/L 22  CBC Latest Ref Rng & Units 09/02/2019  WBC 4.0 - 10.5 K/uL 3.6(L)  Hemoglobin 12.0 - 15.0 g/dL 9.3(L)  Hematocrit 36.0 - 46.0 % 27.8(L)  Platelets 150 - 400 K/uL 226      Assessment and plan- Patient is a 64 y.o. female clinical prognostic stage Ia invasive mammary carcinoma cT1 ccN0 cM0 ER/PR negative and HER-2/neu negative.   She is here for symptoms of dizziness and on treatment assessment prior to cycle 7 of weekly carbotaxol chemotherapy  Dizziness-no evidence of postural hypotension on today'sVisit.  Her blood pressure is in the 150s.  Unclear as to what is precipitating lightheadedness.  No syncopal episodes no concerning associated cardiovascular or neurological symptoms.  If symptoms persist I will consider getting a brain MRI  Hypokalemia: We will give her IV potassium today along with 1 L of IV fluids.  She will take 60 mEq of potassium daily for a week.  She will directly proceed for cycle 7 of weekly carbotaxol chemotherapy on 09/04/2019.  I will see her back in 10 days for cycle 8  Chemo-induced neutropenia: Mild continue to monitor   Visit Diagnosis 1. Malignant neoplasm of lower-inner quadrant of left breast in female, estrogen receptor negative ([place])   2. Encounter for antineoplastic chemotherapy      Dr. Randa Evens, MD, MPH [facility] at [facility] [phone] 09/04/2019 11:35 AM

## 2019-09-11 ENCOUNTER — Other Ambulatory Visit: Payer: Self-pay

## 2019-09-11 ENCOUNTER — Inpatient Hospital Stay (HOSPITAL_BASED_OUTPATIENT_CLINIC_OR_DEPARTMENT_OTHER): Payer: BC Managed Care – PPO | Admitting: Oncology

## 2019-09-11 ENCOUNTER — Encounter: Payer: Self-pay | Admitting: Oncology

## 2019-09-11 ENCOUNTER — Inpatient Hospital Stay: Payer: BC Managed Care – PPO

## 2019-09-11 VITALS — BP 138/89 | HR 77 | Resp 18

## 2019-09-11 VITALS — BP 121/73 | HR 102 | Temp 97.3°F | Resp 20 | Wt 169.5 lb

## 2019-09-11 DIAGNOSIS — Z5111 Encounter for antineoplastic chemotherapy: Secondary | ICD-10-CM | POA: Diagnosis not present

## 2019-09-11 DIAGNOSIS — Z171 Estrogen receptor negative status [ER-]: Secondary | ICD-10-CM

## 2019-09-11 DIAGNOSIS — D6481 Anemia due to antineoplastic chemotherapy: Secondary | ICD-10-CM | POA: Diagnosis not present

## 2019-09-11 DIAGNOSIS — E876 Hypokalemia: Secondary | ICD-10-CM | POA: Diagnosis not present

## 2019-09-11 DIAGNOSIS — C50312 Malignant neoplasm of lower-inner quadrant of left female breast: Secondary | ICD-10-CM

## 2019-09-11 DIAGNOSIS — G620 Drug-induced polyneuropathy: Secondary | ICD-10-CM

## 2019-09-11 DIAGNOSIS — Z95828 Presence of other vascular implants and grafts: Secondary | ICD-10-CM

## 2019-09-11 DIAGNOSIS — T451X5A Adverse effect of antineoplastic and immunosuppressive drugs, initial encounter: Secondary | ICD-10-CM

## 2019-09-11 LAB — COMPREHENSIVE METABOLIC PANEL
ALT: 21 U/L (ref 0–44)
AST: 18 U/L (ref 15–41)
Albumin: 3.5 g/dL (ref 3.5–5.0)
Alkaline Phosphatase: 75 U/L (ref 38–126)
Anion gap: 9 (ref 5–15)
BUN: 15 mg/dL (ref 8–23)
CO2: 25 mmol/L (ref 22–32)
Calcium: 8.9 mg/dL (ref 8.9–10.3)
Chloride: 99 mmol/L (ref 98–111)
Creatinine, Ser: 0.77 mg/dL (ref 0.44–1.00)
GFR calc Af Amer: >60 mL/min (ref >60)
GFR calc non Af Amer: >60 mL/min (ref >60)
Glucose, Bld: 121 mg/dL — ABNORMAL HIGH (ref 70–99)
Potassium: 3.4 mmol/L — ABNORMAL LOW (ref 3.5–5.1)
Sodium: 133 mmol/L — ABNORMAL LOW (ref 135–145)
Total Bilirubin: 0.6 mg/dL (ref 0.3–1.2)
Total Protein: 6.6 g/dL (ref 6.5–8.1)

## 2019-09-11 LAB — CBC WITH DIFFERENTIAL/PLATELET
Abs Immature Granulocytes: 0.17 10*3/uL — ABNORMAL HIGH (ref 0.00–0.07)
Basophils Absolute: 0 10*3/uL (ref 0.0–0.1)
Basophils Relative: 0 %
Eosinophils Absolute: 0 10*3/uL (ref 0.0–0.5)
Eosinophils Relative: 0 %
HCT: 26.3 % — ABNORMAL LOW (ref 36.0–46.0)
Hemoglobin: 8.8 g/dL — ABNORMAL LOW (ref 12.0–15.0)
Immature Granulocytes: 5 %
Lymphocytes Relative: 22 %
Lymphs Abs: 0.8 10*3/uL (ref 0.7–4.0)
MCH: 30.3 pg (ref 26.0–34.0)
MCHC: 33.5 g/dL (ref 30.0–36.0)
MCV: 90.7 fL (ref 80.0–100.0)
Monocytes Absolute: 0.3 10*3/uL (ref 0.1–1.0)
Monocytes Relative: 8 %
Neutro Abs: 2.3 10*3/uL (ref 1.7–7.7)
Neutrophils Relative %: 65 %
Platelets: 189 10*3/uL (ref 150–400)
RBC: 2.9 MIL/uL — ABNORMAL LOW (ref 3.87–5.11)
RDW: 17.9 % — ABNORMAL HIGH (ref 11.5–15.5)
WBC: 3.5 10*3/uL — ABNORMAL LOW (ref 4.0–10.5)
nRBC: 6.3 % — ABNORMAL HIGH (ref 0.0–0.2)

## 2019-09-11 MED ORDER — SODIUM CHLORIDE 0.9 % IV SOLN
Freq: Once | INTRAVENOUS | Status: AC
  Filled 2019-09-11: qty 1000

## 2019-09-11 MED ORDER — SODIUM CHLORIDE 0.9 % IV SOLN
20.0000 mg | Freq: Once | INTRAVENOUS | Status: AC
  Administered 2019-09-11: 20 mg via INTRAVENOUS
  Filled 2019-09-11: qty 2

## 2019-09-11 MED ORDER — FAMOTIDINE IN NACL 20-0.9 MG/50ML-% IV SOLN
20.0000 mg | Freq: Once | INTRAVENOUS | Status: AC
  Administered 2019-09-11: 20 mg via INTRAVENOUS
  Filled 2019-09-11: qty 50

## 2019-09-11 MED ORDER — DIPHENHYDRAMINE HCL 50 MG/ML IJ SOLN
50.0000 mg | Freq: Once | INTRAMUSCULAR | Status: AC
  Administered 2019-09-11: 50 mg via INTRAVENOUS
  Filled 2019-09-11: qty 1

## 2019-09-11 MED ORDER — HEPARIN SOD (PORK) LOCK FLUSH 100 UNIT/ML IV SOLN
INTRAVENOUS | Status: AC
  Filled 2019-09-11: qty 5

## 2019-09-11 MED ORDER — SODIUM CHLORIDE 0.9 % IV SOLN: 80.0000 mg/m2 | Freq: Once | INTRAVENOUS | Status: DC

## 2019-09-11 MED ORDER — SODIUM CHLORIDE 0.9 % IV SOLN
160.0000 mg | Freq: Once | INTRAVENOUS | Status: AC
  Administered 2019-09-11: 160 mg via INTRAVENOUS
  Filled 2019-09-11: qty 16

## 2019-09-11 MED ORDER — SODIUM CHLORIDE 0.9% FLUSH
10.0000 mL | INTRAVENOUS | Status: DC | PRN
  Administered 2019-09-11: 10 mL via INTRAVENOUS
  Filled 2019-09-11: qty 10

## 2019-09-11 MED ORDER — SODIUM CHLORIDE 0.9 % IV SOLN
Freq: Once | INTRAVENOUS | Status: AC
  Filled 2019-09-11: qty 250

## 2019-09-11 MED ORDER — SODIUM CHLORIDE 0.9% FLUSH
10.0000 mL | INTRAVENOUS | Status: DC | PRN
  Filled 2019-09-11: qty 10

## 2019-09-11 MED ORDER — SODIUM CHLORIDE 0.9 % IV SOLN
65.0000 mg/m2 | Freq: Once | INTRAVENOUS | Status: AC
  Administered 2019-09-11: 120 mg via INTRAVENOUS
  Filled 2019-09-11: qty 20

## 2019-09-11 MED ORDER — HEPARIN SOD (PORK) LOCK FLUSH 100 UNIT/ML IV SOLN
500.0000 [IU] | Freq: Once | INTRAVENOUS | Status: AC | PRN
  Administered 2019-09-11: 500 [IU]
  Filled 2019-09-11: qty 5

## 2019-09-11 NOTE — Progress Notes (Signed)
Patient has numbness/tingling in hands and feet. Taxol dose reduced from 80mg /m2 to 65mg /m2 per MD.

## 2019-09-11 NOTE — Progress Notes (Signed)
Patient here today for follow up and treatment consideration regarding breast cancer. Patient reports numbness and tingling in hands and fingers. Patient also reports worsening sensitivity to light.

## 2019-09-11 NOTE — Progress Notes (Signed)
HR 102. Per Lanetta Inch RN, okay to proceed with treatment.

## 2019-09-15 MED ORDER — DULOXETINE HCL 30 MG PO CPEP
30.0000 mg | ORAL_CAPSULE | Freq: Every day | ORAL | 3 refills | Status: DC
Start: 2019-09-15 — End: 2020-02-26

## 2019-09-15 NOTE — Progress Notes (Signed)
Hematology/Oncology Consult note [facility]  [phone] [phone]  Patient Care Team: Lanny Hurst, MD as PCP - General (Internal Medicine) Rico Junker, RN as Registered Nurse Theodore Demark, RN as Registered Nurse   Name of the patient: Molly Walters  [id number]  Oct 04, 1955   Date of visit: 09/15/19  Diagnosis- clinical prognostic stage Ia invasive mammary carcinoma cmT1 ccN0 cM0 ER/PR negative and HER-2/neu negative   Chief complaint/ Reason for visit-on treatment assessment prior to cycle 8 of weekly carbotaxol chemotherapy  Heme/Onc history: patient is a 64 year old African-American female with past medical history significant for hypertension. She has had prior breast cancer in 2001 s/p lumpectomy followed by adjuvant radiation treatment. She did not require adjuvant chemotherapy. She does not remember taking any endocrine therapy as well.More recently patient underwent a bilateral screening mammogram in November 2020 which showed a possible mass in the left breast. This was followed by a diagnostic mammogram and ultrasound which showed a 1.6 cm mass at the 7 o'clock position in the left breast near the lumpectomy scar. No left axillary adenopathy. 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 Patient has seen Dr. Gretchen Short referred to [place] for further management.Patient is independent of her ADLs and IADLs. Family history significant for prostate cancer in her maternal uncle. No other family history of breast, ovarian pancreatic colon cancer or melanoma.  MRI bilateral breast showed:The size of the primary left breast mass appears larger on MRI close to 2.4 x 1.9 x 1.7 cm. She is also noted to have 2 additional enhancing masses 9 x 6 x 10 mm and 5 x 5 x 5 mm in the left breast with suspicious washout kinetics. No pathological left axillary adenopathy.  Prominent lymph nodes seen on the right side along with non-mass enhancement spanning 5.3 cm in the AP dimension.Both the enhancing masses in the left breast were positive for triple negative breast cancer. Right sided axillary lymph node was negative for malignancy.Right breast biopsy showed DCIS  Patient has completed neoadjuvant dose dense AC chemotherapy and is currently on weekly carbotaxol chemotherapy    Interval history- Patient currently reports tingling numbness which is extending to other fingers.  Dizziness is better.  Continues to have ongoing fatigue  ECOG PS- 1 Pain scale- 0   Review of systems- Review of Systems  Constitutional: Positive for malaise/fatigue. Negative for chills, fever and weight loss.  HENT: Negative for congestion, ear discharge and nosebleeds.   Eyes: Negative for blurred vision.  Respiratory: Negative for cough, hemoptysis, sputum production, shortness of breath and wheezing.   Cardiovascular: Negative for chest pain, palpitations, orthopnea and claudication.  Gastrointestinal: Negative for abdominal pain, blood in stool, constipation, diarrhea, heartburn, melena, nausea and vomiting.  Genitourinary: Negative for dysuria, flank pain, frequency, hematuria and urgency.  Musculoskeletal: Negative for back pain, joint pain and myalgias.  Skin: Negative for rash.  Neurological: Positive for sensory change (Peripheral neuropathy). Negative for dizziness, tingling, focal weakness, seizures, weakness and headaches.  Endo/Heme/Allergies: Does not bruise/bleed easily.  Psychiatric/Behavioral: Negative for depression and suicidal ideas. The patient does not have insomnia.        No Known Allergies   Past Medical History:  Diagnosis Date  . Breast cancer ([place]) 2001   left breast lumpectomy and rad tx  . Breast cancer ([place]) 02/2019   left breast mass w/ biopsy  . Hypertension   . Personal history of radiation  therapy 2001   BREAST CA     Past  Surgical History:  Procedure Laterality Date  . ABDOMINAL HYSTERECTOMY    . BREAST BIOPSY Left 2001   POS  . BREAST BIOPSY Left 2021   positive  . BREAST LUMPECTOMY Left 2001  . PORTACATH PLACEMENT Right 05/16/2019   Procedure: INSERTION PORT-A-CATH;  Surgeon: Jules Husbands, MD;  Location: [facility] ORS;  Service: General;  Laterality: Right;    Social History   Socioeconomic History  . Marital status: Divorced    Spouse name: Not on file  . Number of children: Not on file  . Years of education: Not on file  . Highest education level: Not on file  Occupational History  . Not on file  Tobacco Use  . Smoking status: Former Smoker    Packs/day: 0.50    Years: 5.00    Pack years: 2.50    Types: Cigarettes    Quit date: 1970    Years since quitting: 51.4  . Smokeless tobacco: Never Used  Substance and Sexual Activity  . Alcohol use: Never  . Drug use: Never  . Sexual activity: Not Currently  Other Topics Concern  . Not on file  Social History Narrative  . Not on file   Social Determinants of Health   Financial Resource Strain:   . Difficulty of Paying Living Expenses:   Food Insecurity:   . Worried About Charity fundraiser in the Last Year:   . Arboriculturist in the Last Year:   Transportation Needs:   . Film/video editor (Medical):   Marland Kitchen Lack of Transportation (Non-Medical):   Physical Activity:   . Days of Exercise per Week:   . Minutes of Exercise per Session:   Stress:   . Feeling of Stress :   Social Connections:   . Frequency of Communication with Friends and Family:   . Frequency of Social Gatherings with Friends and Family:   . Attends Religious Services:   . Active Member of Clubs or Organizations:   . Attends Archivist Meetings:   Marland Kitchen Marital Status:   Intimate Partner Violence:   . Fear of Current or Ex-Partner:   . Emotionally Abused:   Marland Kitchen Physically Abused:   . Sexually Abused:     Family History  Problem Relation Age of Onset  .  Hypertension Mother   . Prostate cancer Paternal Uncle   . Breast cancer Neg Hx      Current Outpatient Medications:  .  acetaminophen (TYLENOL) 500 MG tablet, Take 500 mg by mouth every 6 (six) hours as needed., Disp: , Rfl:  .  cholecalciferol (VITAMIN D3) 25 MCG (1000 UNIT) tablet, Take 1,000 Units by mouth daily., Disp: , Rfl:  .  dexamethasone (DECADRON) 4 MG tablet, TAKE 2 TABLETS BY MOUTH ONCE A DAY ON THE DAY AFTER CHEMOTHERAPY AND THEN 2 TABLETS TWICE DAILY FOR 2 DAYS. TAKE WITH FOOD., Disp: 30 tablet, Rfl: 11 .  hydrochlorothiazide (HYDRODIURIL) 25 MG tablet, Take 25 mg by mouth daily., Disp: , Rfl:  .  latanoprost (XALATAN) 0.005 % ophthalmic solution, Place 1 drop into both eyes at bedtime. , Disp: , Rfl:  .  lidocaine-prilocaine (EMLA) cream, Apply to affected area once, Disp: 30 g, Rfl: 3 .  loperamide (IMODIUM A-D) 2 MG tablet, Take 2 mg by mouth 4 (four) times daily as needed for diarrhea or loose stools., Disp: , Rfl:  .  LORazepam (ATIVAN) 0.5 MG tablet,  Take 1 tablet (0.5 mg total) by mouth every 6 (six) hours as needed (Nausea or vomiting)., Disp: 30 tablet, Rfl: 0 .  losartan (COZAAR) 100 MG tablet, Take 100 mg by mouth daily., Disp: , Rfl:  .  ondansetron (ZOFRAN) 8 MG tablet, Take 1 tablet (8 mg total) by mouth 2 (two) times daily as needed. Start on the third day after chemotherapy., Disp: 30 tablet, Rfl: 1 .  pantoprazole (PROTONIX) 20 MG tablet, Take 1 tablet (20 mg total) by mouth daily., Disp: 30 tablet, Rfl: 3 .  potassium chloride SA (KLOR-CON) 20 MEQ tablet, Take 1 tablet (20 mEq total) by mouth 3 (three) times daily., Disp: 90 tablet, Rfl: 3 .  prochlorperazine (COMPAZINE) 10 MG tablet, Take 1 tablet (10 mg total) by mouth every 6 (six) hours as needed (Nausea or vomiting)., Disp: 30 tablet, Rfl: 1 .  timolol (TIMOPTIC) 0.5 % ophthalmic solution, Place 1 drop into both eyes daily. , Disp: , Rfl:   Physical exam:  Vitals:   09/11/19 0910  BP: 121/73  Pulse:  (!) 102  Resp: 20  Temp: (!) 97.3 F (36.3 C)  TempSrc: Tympanic  Weight: 169 lb 8 oz (76.9 kg)   Physical Exam Constitutional:      General: She is not in acute distress. Cardiovascular:     Rate and Rhythm: Normal rate and regular rhythm.     Heart sounds: Normal heart sounds.  Pulmonary:     Effort: Pulmonary effort is normal.     Breath sounds: Normal breath sounds.  Abdominal:     General: Bowel sounds are normal.     Palpations: Abdomen is soft.  Skin:    General: Skin is warm and dry.  Neurological:     Mental Status: She is alert and oriented to person, place, and time.      CMP Latest Ref Rng & Units 09/11/2019  Glucose 70 - 99 mg/dL 121(H)  BUN 8 - 23 mg/dL 15  Creatinine 0.44 - 1.00 mg/dL 0.77  Sodium 135 - 145 mmol/L 133(L)  Potassium 3.5 - 5.1 mmol/L 3.4(L)  Chloride 98 - 111 mmol/L 99  CO2 22 - 32 mmol/L 25  Calcium 8.9 - 10.3 mg/dL 8.9  Total Protein 6.5 - 8.1 g/dL 6.6  Total Bilirubin 0.3 - 1.2 mg/dL 0.6  Alkaline Phos 38 - 126 U/L 75  AST 15 - 41 U/L 18  ALT 0 - 44 U/L 21   CBC Latest Ref Rng & Units 09/11/2019  WBC 4.0 - 10.5 K/uL 3.5(L)  Hemoglobin 12.0 - 15.0 g/dL 8.8(L)  Hematocrit 36.0 - 46.0 % 26.3(L)  Platelets 150 - 400 K/uL 189      Assessment and plan- Patient is a 64 y.o. female clinical prognostic stage Ia invasive mammary carcinoma cT1 ccN0 cM0 ER/PR negative and HER-2/neu negative. She is here for on treatment assessment prior to cycle 8 of weekly carbotaxol chemotherapy  counts okay to proceed with cycle 8 of weekly carbotaxol chemotherapy today.  She has mild leukopenia but ANC remains more than 2.  She will not receive any Neupogen at this cycle.  She will directly proceed with cycle 9 next week and I will see her back in 2 weeks for cycle plan.  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  I have reviewed the dose of Taxol to 65 mg per metered square.  I will also start her on Cymbalta 30 mg once a day for a week and  following that she will increase  it to 60 mg daily.  Hypokalemia: Continue oral potassium  Chemo-induced anemia: Stable to be benign.  She has not required any blood transfusions.  Continue to monitor   Visit Diagnosis 1. Encounter for antineoplastic chemotherapy   2. Malignant neoplasm of lower-inner quadrant of left breast in female, estrogen receptor negative ([place])   3. Anemia due to antineoplastic chemotherapy   4. Hypokalemia   5. Chemotherapy-induced peripheral neuropathy ([place])      Dr. Randa Evens, MD, MPH [facility] at [facility] [phone] 09/15/2019 9:52 AM

## 2019-09-17 MED ORDER — SODIUM CHLORIDE 0.9% FLUSH
10.0000 mL | INTRAVENOUS | Status: DC | PRN
  Filled 2019-09-17: qty 10

## 2019-09-18 ENCOUNTER — Inpatient Hospital Stay (HOSPITAL_BASED_OUTPATIENT_CLINIC_OR_DEPARTMENT_OTHER): Payer: BC Managed Care – PPO | Admitting: Oncology

## 2019-09-18 ENCOUNTER — Inpatient Hospital Stay: Payer: BC Managed Care – PPO | Attending: Oncology

## 2019-09-18 ENCOUNTER — Inpatient Hospital Stay: Payer: BC Managed Care – PPO

## 2019-09-18 ENCOUNTER — Other Ambulatory Visit: Payer: Self-pay

## 2019-09-18 ENCOUNTER — Encounter: Payer: Self-pay | Admitting: Oncology

## 2019-09-18 VITALS — BP 142/80 | HR 83 | Temp 98.8°F | Resp 20 | Wt 174.0 lb

## 2019-09-18 VITALS — BP 142/80 | HR 83 | Temp 98.8°F | Resp 16 | Wt 174.0 lb

## 2019-09-18 DIAGNOSIS — Z7952 Long term (current) use of systemic steroids: Secondary | ICD-10-CM | POA: Insufficient documentation

## 2019-09-18 DIAGNOSIS — T451X5A Adverse effect of antineoplastic and immunosuppressive drugs, initial encounter: Secondary | ICD-10-CM | POA: Insufficient documentation

## 2019-09-18 DIAGNOSIS — Z5111 Encounter for antineoplastic chemotherapy: Secondary | ICD-10-CM | POA: Diagnosis not present

## 2019-09-18 DIAGNOSIS — G620 Drug-induced polyneuropathy: Secondary | ICD-10-CM | POA: Insufficient documentation

## 2019-09-18 DIAGNOSIS — Z853 Personal history of malignant neoplasm of breast: Secondary | ICD-10-CM | POA: Diagnosis not present

## 2019-09-18 DIAGNOSIS — Z923 Personal history of irradiation: Secondary | ICD-10-CM | POA: Insufficient documentation

## 2019-09-18 DIAGNOSIS — Z79899 Other long term (current) drug therapy: Secondary | ICD-10-CM | POA: Diagnosis not present

## 2019-09-18 DIAGNOSIS — Z171 Estrogen receptor negative status [ER-]: Secondary | ICD-10-CM | POA: Insufficient documentation

## 2019-09-18 DIAGNOSIS — C50312 Malignant neoplasm of lower-inner quadrant of left female breast: Secondary | ICD-10-CM | POA: Diagnosis not present

## 2019-09-18 DIAGNOSIS — E876 Hypokalemia: Secondary | ICD-10-CM | POA: Diagnosis not present

## 2019-09-18 DIAGNOSIS — D6481 Anemia due to antineoplastic chemotherapy: Secondary | ICD-10-CM | POA: Diagnosis not present

## 2019-09-18 DIAGNOSIS — R5381 Other malaise: Secondary | ICD-10-CM | POA: Diagnosis not present

## 2019-09-18 DIAGNOSIS — Z7689 Persons encountering health services in other specified circumstances: Secondary | ICD-10-CM | POA: Diagnosis not present

## 2019-09-18 DIAGNOSIS — R5383 Other fatigue: Secondary | ICD-10-CM | POA: Diagnosis not present

## 2019-09-18 DIAGNOSIS — I10 Essential (primary) hypertension: Secondary | ICD-10-CM | POA: Insufficient documentation

## 2019-09-18 DIAGNOSIS — Z87891 Personal history of nicotine dependence: Secondary | ICD-10-CM | POA: Insufficient documentation

## 2019-09-18 DIAGNOSIS — Z95828 Presence of other vascular implants and grafts: Secondary | ICD-10-CM

## 2019-09-18 LAB — CBC WITH DIFFERENTIAL/PLATELET
Abs Immature Granulocytes: 0.17 10*3/uL — ABNORMAL HIGH (ref 0.00–0.07)
Basophils Absolute: 0 10*3/uL (ref 0.0–0.1)
Basophils Relative: 0 %
Eosinophils Absolute: 0 10*3/uL (ref 0.0–0.5)
Eosinophils Relative: 0 %
HCT: 27.1 % — ABNORMAL LOW (ref 36.0–46.0)
Hemoglobin: 8.9 g/dL — ABNORMAL LOW (ref 12.0–15.0)
Immature Granulocytes: 6 %
Lymphocytes Relative: 19 %
Lymphs Abs: 0.6 10*3/uL — ABNORMAL LOW (ref 0.7–4.0)
MCH: 30.3 pg (ref 26.0–34.0)
MCHC: 32.8 g/dL (ref 30.0–36.0)
MCV: 92.2 fL (ref 80.0–100.0)
Monocytes Absolute: 0.2 10*3/uL (ref 0.1–1.0)
Monocytes Relative: 6 %
Neutro Abs: 2.1 10*3/uL (ref 1.7–7.7)
Neutrophils Relative %: 69 %
Platelets: 161 10*3/uL (ref 150–400)
RBC: 2.94 MIL/uL — ABNORMAL LOW (ref 3.87–5.11)
RDW: 17.3 % — ABNORMAL HIGH (ref 11.5–15.5)
WBC: 3.1 10*3/uL — ABNORMAL LOW (ref 4.0–10.5)
nRBC: 8.7 % — ABNORMAL HIGH (ref 0.0–0.2)

## 2019-09-18 LAB — COMPREHENSIVE METABOLIC PANEL
ALT: 23 U/L (ref 0–44)
AST: 18 U/L (ref 15–41)
Albumin: 3.3 g/dL — ABNORMAL LOW (ref 3.5–5.0)
Alkaline Phosphatase: 69 U/L (ref 38–126)
Anion gap: 8 (ref 5–15)
BUN: 16 mg/dL (ref 8–23)
CO2: 25 mmol/L (ref 22–32)
Calcium: 8.6 mg/dL — ABNORMAL LOW (ref 8.9–10.3)
Chloride: 102 mmol/L (ref 98–111)
Creatinine, Ser: 0.52 mg/dL (ref 0.44–1.00)
GFR calc Af Amer: >60 mL/min (ref >60)
GFR calc non Af Amer: >60 mL/min (ref >60)
Glucose, Bld: 128 mg/dL — ABNORMAL HIGH (ref 70–99)
Potassium: 3.9 mmol/L (ref 3.5–5.1)
Sodium: 135 mmol/L (ref 135–145)
Total Bilirubin: 0.5 mg/dL (ref 0.3–1.2)
Total Protein: 6.3 g/dL — ABNORMAL LOW (ref 6.5–8.1)

## 2019-09-18 MED ORDER — SODIUM CHLORIDE 0.9 % IV SOLN
65.0000 mg/m2 | Freq: Once | INTRAVENOUS | Status: AC
  Administered 2019-09-18: 120 mg via INTRAVENOUS
  Filled 2019-09-18: qty 20

## 2019-09-18 MED ORDER — DIPHENHYDRAMINE HCL 50 MG/ML IJ SOLN
50.0000 mg | Freq: Once | INTRAMUSCULAR | Status: AC
  Administered 2019-09-18: 50 mg via INTRAVENOUS
  Filled 2019-09-18: qty 1

## 2019-09-18 MED ORDER — SODIUM CHLORIDE 0.9% FLUSH
10.0000 mL | INTRAVENOUS | Status: DC | PRN
  Administered 2019-09-18: 10 mL
  Filled 2019-09-18: qty 10

## 2019-09-18 MED ORDER — FAMOTIDINE IN NACL 20-0.9 MG/50ML-% IV SOLN
20.0000 mg | Freq: Once | INTRAVENOUS | Status: AC
  Administered 2019-09-18: 20 mg via INTRAVENOUS
  Filled 2019-09-18: qty 50

## 2019-09-18 MED ORDER — HEPARIN SOD (PORK) LOCK FLUSH 100 UNIT/ML IV SOLN
INTRAVENOUS | Status: AC
  Filled 2019-09-18: qty 5

## 2019-09-18 MED ORDER — SODIUM CHLORIDE 0.9 % IV SOLN
160.0000 mg | Freq: Once | INTRAVENOUS | Status: AC
  Administered 2019-09-18: 160 mg via INTRAVENOUS
  Filled 2019-09-18: qty 16

## 2019-09-18 MED ORDER — SODIUM CHLORIDE 0.9 % IV SOLN
20.0000 mg | Freq: Once | INTRAVENOUS | Status: AC
  Administered 2019-09-18: 20 mg via INTRAVENOUS
  Filled 2019-09-18: qty 20

## 2019-09-18 MED ORDER — SODIUM CHLORIDE 0.9 % IV SOLN
Freq: Once | INTRAVENOUS | Status: AC
  Filled 2019-09-18: qty 250

## 2019-09-18 MED ORDER — SODIUM CHLORIDE 0.9 % IV SOLN
Freq: Once | INTRAVENOUS | Status: AC
  Administered 2019-09-18: 1000 mL via INTRAVENOUS
  Filled 2019-09-18: qty 250

## 2019-09-18 MED ORDER — HEPARIN SOD (PORK) LOCK FLUSH 100 UNIT/ML IV SOLN
500.0000 [IU] | Freq: Once | INTRAVENOUS | Status: AC | PRN
  Administered 2019-09-18: 500 [IU]
  Filled 2019-09-18: qty 5

## 2019-09-18 NOTE — Progress Notes (Signed)
She is still tired but not as bad as last week. She had the covid vaccine last time and she feels that it is the reason why she was tired but rao gave her steroids so it did help but then still tired since yest. She also did not gt rx at [facility] and she checked 3-4 times. I checked in computer and it was sent. She is having numbness and tingling on regular basis with both hands and it is all finger tips. She feels like she is eating good and drinking fluids

## 2019-09-18 NOTE — Progress Notes (Signed)
Port-a-cath does not yield blood return today. Port-a-cath flushes without difficulty. No swelling, redness, or pain noted at port site. MD, Dr. Janese Banks, notified and aware. Per MD order: Port-a-cath can still be used for Taxol and Carboplatin treatment today; proceed with treatment at this time.

## 2019-09-19 NOTE — Progress Notes (Signed)
Hematology/Oncology Consult note [facility]  Telephone:([phone] [phone]  Patient Care Team: Lanny Hurst, MD as PCP - General (Internal Medicine) Rico Junker, RN as Registered Nurse Theodore Demark, RN as Registered Nurse Sindy Guadeloupe, MD as Consulting Physician (Oncology)   Name of the patient: Molly Walters  [id number]  Jan 27, 1956   Date of visit: 09/19/19  Diagnosis- clinical prognostic stage Ia invasive mammary carcinoma cmT1 ccN0 cM0 ER/PR negative and HER-2/neu negative  Chief complaint/ Reason for visit-on treatment assessment prior to cycle 9 of weekly carbotaxol chemotherapy  Heme/Onc history: patient is a 64 year old African-American female with past medical history significant for hypertension. She has had prior breast cancer in 2001 s/p lumpectomy followed by adjuvant radiation treatment. She did not require adjuvant chemotherapy. She does not remember taking any endocrine therapy as well.More recently patient underwent a bilateral screening mammogram in November 2020 which showed a possible mass in the left breast. This was followed by a diagnostic mammogram and ultrasound which showed a 1.6 cm mass at the 7 o'clock position in the left breast near the lumpectomy scar. No left axillary adenopathy. 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 Patient has seen Dr. Gretchen Short referred to [place] for further management.Patient is independent of her ADLs and IADLs. Family history significant for prostate cancer in her maternal uncle. No other family history of breast, ovarian pancreatic colon cancer or melanoma.  MRI bilateral breast showed:The size of the primary left breast mass appears larger on MRI close to 2.4 x 1.9 x 1.7 cm. She is also noted to have 2 additional enhancing masses 9 x 6 x 10 mm and 5 x 5 x 5 mm in the left breast with suspicious washout  kinetics. No pathological left axillary adenopathy. Prominent lymph nodes seen on the right side along with non-mass enhancement spanning 5.3 cm in the AP dimension.Both the enhancing masses in the left breast were positive for triple negative breast cancer. Right sided axillary lymph node was negative for malignancy.Right breast biopsy showed DCIS  Patient has completed neoadjuvant dose dense AC chemotherapy and is currently on weekly carbotaxol chemotherapy    Interval history-patient feels fatigued.  Neuropathy is continuing to bother her.  She has not picked up her Cymbalta prescription yet.  ECOG PS- 1 Pain scale- 0   Review of systems- Review of Systems  Constitutional: Positive for malaise/fatigue. Negative for chills, fever and weight loss.  HENT: Negative for congestion, ear discharge and nosebleeds.   Eyes: Negative for blurred vision.  Respiratory: Negative for cough, hemoptysis, sputum production, shortness of breath and wheezing.   Cardiovascular: Negative for chest pain, palpitations, orthopnea and claudication.  Gastrointestinal: Negative for abdominal pain, blood in stool, constipation, diarrhea, heartburn, melena, nausea and vomiting.  Genitourinary: Negative for dysuria, flank pain, frequency, hematuria and urgency.  Musculoskeletal: Negative for back pain, joint pain and myalgias.  Skin: Negative for rash.  Neurological: Negative for dizziness, tingling, focal weakness, seizures, weakness and headaches.  Endo/Heme/Allergies: Does not bruise/bleed easily.  Psychiatric/Behavioral: Negative for depression and suicidal ideas. The patient does not have insomnia.       No Known Allergies   Past Medical History:  Diagnosis Date  . Breast cancer ([place]) 2001   left breast lumpectomy and rad tx  . Breast cancer ([place]) 02/2019   left breast mass w/ biopsy  . Hypertension   . Personal history of radiation therapy 2001  BREAST CA     Past Surgical History:    Procedure Laterality Date  . ABDOMINAL HYSTERECTOMY    . BREAST BIOPSY Left 2001   POS  . BREAST BIOPSY Left 2021   positive  . BREAST LUMPECTOMY Left 2001  . PORTACATH PLACEMENT Right 05/16/2019   Procedure: INSERTION PORT-A-CATH;  Surgeon: Jules Husbands, MD;  Location: [facility] ORS;  Service: General;  Laterality: Right;    Social History   Socioeconomic History  . Marital status: Divorced    Spouse name: Not on file  . Number of children: Not on file  . Years of education: Not on file  . Highest education level: Not on file  Occupational History  . Not on file  Tobacco Use  . Smoking status: Former Smoker    Packs/day: 0.50    Years: 5.00    Pack years: 2.50    Types: Cigarettes    Quit date: 1970    Years since quitting: 51.4  . Smokeless tobacco: Never Used  Substance and Sexual Activity  . Alcohol use: Never  . Drug use: Never  . Sexual activity: Not Currently  Other Topics Concern  . Not on file  Social History Narrative  . Not on file   Social Determinants of Health   Financial Resource Strain:   . Difficulty of Paying Living Expenses:   Food Insecurity:   . Worried About Charity fundraiser in the Last Year:   . Arboriculturist in the Last Year:   Transportation Needs:   . Film/video editor (Medical):   Marland Kitchen Lack of Transportation (Non-Medical):   Physical Activity:   . Days of Exercise per Week:   . Minutes of Exercise per Session:   Stress:   . Feeling of Stress :   Social Connections:   . Frequency of Communication with Friends and Family:   . Frequency of Social Gatherings with Friends and Family:   . Attends Religious Services:   . Active Member of Clubs or Organizations:   . Attends Archivist Meetings:   Marland Kitchen Marital Status:   Intimate Partner Violence:   . Fear of Current or Ex-Partner:   . Emotionally Abused:   Marland Kitchen Physically Abused:   . Sexually Abused:     Family History  Problem Relation Age of Onset  . Hypertension  Mother   . Prostate cancer Paternal Uncle   . Breast cancer Neg Hx      Current Outpatient Medications:  .  acetaminophen (TYLENOL) 500 MG tablet, Take 500 mg by mouth every 6 (six) hours as needed., Disp: , Rfl:  .  cholecalciferol (VITAMIN D3) 25 MCG (1000 UNIT) tablet, Take 1,000 Units by mouth daily., Disp: , Rfl:  .  dexamethasone (DECADRON) 4 MG tablet, TAKE 2 TABLETS BY MOUTH ONCE A DAY ON THE DAY AFTER CHEMOTHERAPY AND THEN 2 TABLETS TWICE DAILY FOR 2 DAYS. TAKE WITH FOOD., Disp: 30 tablet, Rfl: 11 .  DULoxetine (CYMBALTA) 30 MG capsule, Take 1 capsule (30 mg total) by mouth daily. Increase to 60 mg daily after 1 week, Disp: 60 capsule, Rfl: 3 .  hydrochlorothiazide (HYDRODIURIL) 25 MG tablet, Take 25 mg by mouth daily., Disp: , Rfl:  .  latanoprost (XALATAN) 0.005 % ophthalmic solution, Place 1 drop into both eyes at bedtime. , Disp: , Rfl:  .  lidocaine-prilocaine (EMLA) cream, Apply to affected area once, Disp: 30 g, Rfl: 3 .  loperamide (IMODIUM A-D) 2 MG tablet, Take  2 mg by mouth 4 (four) times daily as needed for diarrhea or loose stools., Disp: , Rfl:  .  LORazepam (ATIVAN) 0.5 MG tablet, Take 1 tablet (0.5 mg total) by mouth every 6 (six) hours as needed (Nausea or vomiting)., Disp: 30 tablet, Rfl: 0 .  losartan (COZAAR) 100 MG tablet, Take 100 mg by mouth daily., Disp: , Rfl:  .  ondansetron (ZOFRAN) 8 MG tablet, Take 1 tablet (8 mg total) by mouth 2 (two) times daily as needed. Start on the third day after chemotherapy., Disp: 30 tablet, Rfl: 1 .  pantoprazole (PROTONIX) 20 MG tablet, Take 1 tablet (20 mg total) by mouth daily., Disp: 30 tablet, Rfl: 3 .  potassium chloride SA (KLOR-CON) 20 MEQ tablet, Take 1 tablet (20 mEq total) by mouth 3 (three) times daily., Disp: 90 tablet, Rfl: 3 .  prochlorperazine (COMPAZINE) 10 MG tablet, Take 1 tablet (10 mg total) by mouth every 6 (six) hours as needed (Nausea or vomiting)., Disp: 30 tablet, Rfl: 1 .  timolol (TIMOPTIC) 0.5 %  ophthalmic solution, Place 1 drop into both eyes daily. , Disp: , Rfl:   Physical exam: There were no vitals filed for this visit. Physical Exam HENT:     Head: Normocephalic and atraumatic.  Eyes:     Pupils: Pupils are equal, round, and reactive to light.  Cardiovascular:     Rate and Rhythm: Normal rate and regular rhythm.     Heart sounds: Normal heart sounds.  Pulmonary:     Effort: Pulmonary effort is normal.     Breath sounds: Normal breath sounds.  Abdominal:     General: Bowel sounds are normal.     Palpations: Abdomen is soft.  Musculoskeletal:     Cervical back: Normal range of motion.  Skin:    General: Skin is warm and dry.  Neurological:     Mental Status: She is alert and oriented to person, place, and time.      CMP Latest Ref Rng & Units 09/18/2019  Glucose 70 - 99 mg/dL 128(H)  BUN 8 - 23 mg/dL 16  Creatinine 0.44 - 1.00 mg/dL 0.52  Sodium 135 - 145 mmol/L 135  Potassium 3.5 - 5.1 mmol/L 3.9  Chloride 98 - 111 mmol/L 102  CO2 22 - 32 mmol/L 25  Calcium 8.9 - 10.3 mg/dL 8.6(L)  Total Protein 6.5 - 8.1 g/dL 6.3(L)  Total Bilirubin 0.3 - 1.2 mg/dL 0.5  Alkaline Phos 38 - 126 U/L 69  AST 15 - 41 U/L 18  ALT 0 - 44 U/L 23   CBC Latest Ref Rng & Units 09/18/2019  WBC 4.0 - 10.5 K/uL 3.1(L)  Hemoglobin 12.0 - 15.0 g/dL 8.9(L)  Hematocrit 36.0 - 46.0 % 27.1(L)  Platelets 150 - 400 K/uL 161      Assessment and plan- Patient is a 64 y.o. female clinical prognostic stage Ia invasive mammary carcinoma cT1 ccN0 cM0 ER/PR negative and HER-2/neu negative.  She is here for on treatment assessment prior to cycle 9 of weekly carbotaxol chemotherapy  Counts okay to proceed with cycle 9 of weekly carbotaxol chemotherapy today.So far her hemoglobin is staying stable between 8.5 and 9.5.  White count is 3.1 today with an ANC of 2.1.  Platelet counts are normal.  She will not require Neupogen with this cycle.  I will see her back in 1 week for cycle 10.  I will give her 1  L of IV fluids today.  Chemo-induced peripheral neuropathy: She  will pick up her Cymbalta prescription today   Visit Diagnosis 1. Encounter for antineoplastic chemotherapy   2. Malignant neoplasm of lower-inner quadrant of left breast in female, estrogen receptor negative ([place])   3. Chemotherapy-induced peripheral neuropathy ([place])      Dr. Randa Evens, MD, MPH [facility] [phone] 09/19/2019 12:34 PM

## 2019-09-22 ENCOUNTER — Other Ambulatory Visit: Payer: Self-pay | Admitting: Oncology

## 2019-09-24 ENCOUNTER — Encounter: Payer: Self-pay | Admitting: Oncology

## 2019-09-24 NOTE — Progress Notes (Signed)
Patient was called for pre assessment. She denies any pain. State she has had some problems sleeping the past 2 nights. Denies any other concerns at this time.

## 2019-09-25 ENCOUNTER — Other Ambulatory Visit: Payer: Self-pay

## 2019-09-25 ENCOUNTER — Encounter: Payer: Self-pay | Admitting: *Deleted

## 2019-09-25 ENCOUNTER — Inpatient Hospital Stay: Payer: BC Managed Care – PPO

## 2019-09-25 ENCOUNTER — Inpatient Hospital Stay (HOSPITAL_BASED_OUTPATIENT_CLINIC_OR_DEPARTMENT_OTHER): Payer: BC Managed Care – PPO | Admitting: Oncology

## 2019-09-25 VITALS — BP 124/90 | HR 120 | Temp 97.8°F | Wt 172.4 lb

## 2019-09-25 VITALS — BP 142/90 | HR 98 | Resp 20

## 2019-09-25 DIAGNOSIS — Z95828 Presence of other vascular implants and grafts: Secondary | ICD-10-CM

## 2019-09-25 DIAGNOSIS — G620 Drug-induced polyneuropathy: Secondary | ICD-10-CM | POA: Diagnosis not present

## 2019-09-25 DIAGNOSIS — Z171 Estrogen receptor negative status [ER-]: Secondary | ICD-10-CM

## 2019-09-25 DIAGNOSIS — C50312 Malignant neoplasm of lower-inner quadrant of left female breast: Secondary | ICD-10-CM | POA: Diagnosis not present

## 2019-09-25 DIAGNOSIS — Z5111 Encounter for antineoplastic chemotherapy: Secondary | ICD-10-CM | POA: Diagnosis not present

## 2019-09-25 DIAGNOSIS — D701 Agranulocytosis secondary to cancer chemotherapy: Secondary | ICD-10-CM | POA: Diagnosis not present

## 2019-09-25 DIAGNOSIS — T451X5A Adverse effect of antineoplastic and immunosuppressive drugs, initial encounter: Secondary | ICD-10-CM

## 2019-09-25 DIAGNOSIS — E876 Hypokalemia: Secondary | ICD-10-CM

## 2019-09-25 DIAGNOSIS — R000 Tachycardia, unspecified: Secondary | ICD-10-CM | POA: Diagnosis not present

## 2019-09-25 LAB — CBC WITH DIFFERENTIAL/PLATELET
Abs Immature Granulocytes: 0.09 10*3/uL — ABNORMAL HIGH (ref 0.00–0.07)
Basophils Absolute: 0 10*3/uL (ref 0.0–0.1)
Basophils Relative: 0 %
Eosinophils Absolute: 0 10*3/uL (ref 0.0–0.5)
Eosinophils Relative: 0 %
HCT: 29.3 % — ABNORMAL LOW (ref 36.0–46.0)
Hemoglobin: 9.8 g/dL — ABNORMAL LOW (ref 12.0–15.0)
Immature Granulocytes: 4 %
Lymphocytes Relative: 33 %
Lymphs Abs: 0.8 10*3/uL (ref 0.7–4.0)
MCH: 30.6 pg (ref 26.0–34.0)
MCHC: 33.4 g/dL (ref 30.0–36.0)
MCV: 91.6 fL (ref 80.0–100.0)
Monocytes Absolute: 0.2 10*3/uL (ref 0.1–1.0)
Monocytes Relative: 8 %
Neutro Abs: 1.3 10*3/uL — ABNORMAL LOW (ref 1.7–7.7)
Neutrophils Relative %: 55 %
Platelets: 168 10*3/uL (ref 150–400)
RBC: 3.2 MIL/uL — ABNORMAL LOW (ref 3.87–5.11)
RDW: 17 % — ABNORMAL HIGH (ref 11.5–15.5)
WBC: 2.4 10*3/uL — ABNORMAL LOW (ref 4.0–10.5)
nRBC: 9.2 % — ABNORMAL HIGH (ref 0.0–0.2)

## 2019-09-25 LAB — COMPREHENSIVE METABOLIC PANEL
ALT: 25 U/L (ref 0–44)
AST: 19 U/L (ref 15–41)
Albumin: 3.5 g/dL (ref 3.5–5.0)
Alkaline Phosphatase: 75 U/L (ref 38–126)
Anion gap: 9 (ref 5–15)
BUN: 25 mg/dL — ABNORMAL HIGH (ref 8–23)
CO2: 24 mmol/L (ref 22–32)
Calcium: 8.7 mg/dL — ABNORMAL LOW (ref 8.9–10.3)
Chloride: 100 mmol/L (ref 98–111)
Creatinine, Ser: 0.85 mg/dL (ref 0.44–1.00)
GFR calc Af Amer: >60 mL/min (ref >60)
GFR calc non Af Amer: >60 mL/min (ref >60)
Glucose, Bld: 123 mg/dL — ABNORMAL HIGH (ref 70–99)
Potassium: 3.3 mmol/L — ABNORMAL LOW (ref 3.5–5.1)
Sodium: 133 mmol/L — ABNORMAL LOW (ref 135–145)
Total Bilirubin: 0.5 mg/dL (ref 0.3–1.2)
Total Protein: 6.8 g/dL (ref 6.5–8.1)

## 2019-09-25 MED ORDER — HEPARIN SOD (PORK) LOCK FLUSH 100 UNIT/ML IV SOLN
500.0000 [IU] | Freq: Once | INTRAVENOUS | Status: AC
  Administered 2019-09-25: 500 [IU] via INTRAVENOUS
  Filled 2019-09-25: qty 5

## 2019-09-25 MED ORDER — SODIUM CHLORIDE 0.9% FLUSH
10.0000 mL | INTRAVENOUS | Status: DC | PRN
  Administered 2019-09-25: 10 mL via INTRAVENOUS
  Filled 2019-09-25: qty 10

## 2019-09-25 MED ORDER — SODIUM CHLORIDE 0.9 % IV SOLN
Freq: Once | INTRAVENOUS | Status: AC
  Filled 2019-09-25: qty 1000

## 2019-09-25 MED ORDER — HEPARIN SOD (PORK) LOCK FLUSH 100 UNIT/ML IV SOLN
INTRAVENOUS | Status: AC
  Filled 2019-09-25: qty 5

## 2019-09-28 NOTE — Progress Notes (Signed)
Hematology/Oncology Consult note [facility]  Telephone:([phone] [phone]  Patient Care Team: Lanny Hurst, MD as PCP - General (Internal Medicine) Rico Junker, RN as Registered Nurse Theodore Demark, RN as Registered Nurse Sindy Guadeloupe, MD as Consulting Physician (Oncology)   Name of the patient: Molly Walters  [id number]  08/18/1955   Date of visit: 09/28/19  Diagnosis- clinical prognostic stage Ia invasive mammary carcinoma cmT1 ccN0 cM0 ER/PR negative and HER-2/neu negative   Chief complaint/ Reason for visit-on treatment assessment prior to cycle 10 of weekly carbotaxol chemotherapy  Heme/Onc history: patient is a 64 year old African-American female with past medical history significant for hypertension. She has had prior breast cancer in 2001 s/p lumpectomy followed by adjuvant radiation treatment. She did not require adjuvant chemotherapy. She does not remember taking any endocrine therapy as well.More recently patient underwent a bilateral screening mammogram in November 2020 which showed a possible mass in the left breast. This was followed by a diagnostic mammogram and ultrasound which showed a 1.6 cm mass at the 7 o'clock position in the left breast near the lumpectomy scar. No left axillary adenopathy. 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 Patient has seen Dr. Gretchen Short referred to [place] for further management.Patient is independent of her ADLs and IADLs. Family history significant for prostate cancer in her maternal uncle. No other family history of breast, ovarian pancreatic colon cancer or melanoma.  MRI bilateral breast showed:The size of the primary left breast mass appears larger on MRI close to 2.4 x 1.9 x 1.7 cm. She is also noted to have 2 additional enhancing masses 9 x 6 x 10 mm and 5 x 5 x 5 mm in the left breast with suspicious washout  kinetics. No pathological left axillary adenopathy. Prominent lymph nodes seen on the right side along with non-mass enhancement spanning 5.3 cm in the AP dimension.Both the enhancing masses in the left breast were positive for triple negative breast cancer. Right sided axillary lymph node was negative for malignancy.Right breast biopsy showed DCIS  Patient has completed neoadjuvant dose dense AC chemotherapy and is currently on weekly carbotaxol chemotherapy  Interval history-patient reports ongoing fatigue.  She has had difficulty sleeping over the last 2 days.  Denies any dizziness.  She has not yet picked up her Cymbalta prescription.  She continues to report tingling numbness in her hands and feet.  ECOG PS- 1 Pain scale- 0   Review of systems- Review of Systems  Constitutional: Positive for malaise/fatigue. Negative for chills, fever and weight loss.  HENT: Negative for congestion, ear discharge and nosebleeds.   Eyes: Negative for blurred vision.  Respiratory: Negative for cough, hemoptysis, sputum production, shortness of breath and wheezing.   Cardiovascular: Negative for chest pain, palpitations, orthopnea and claudication.  Gastrointestinal: Negative for abdominal pain, blood in stool, constipation, diarrhea, heartburn, melena, nausea and vomiting.  Genitourinary: Negative for dysuria, flank pain, frequency, hematuria and urgency.  Musculoskeletal: Negative for back pain, joint pain and myalgias.  Skin: Negative for rash.  Neurological: Positive for sensory change (Peripheral neuropathy). Negative for dizziness, tingling, focal weakness, seizures, weakness and headaches.  Endo/Heme/Allergies: Does not bruise/bleed easily.  Psychiatric/Behavioral: Negative for depression and suicidal ideas. The patient does not have insomnia.        No Known Allergies   Past Medical History:  Diagnosis Date  . Breast cancer ([place]) 2001   left breast lumpectomy and  rad tx  . Breast  cancer ([place]) 02/2019   left breast mass w/ biopsy  . Hypertension   . Personal history of radiation therapy 2001   BREAST CA     Past Surgical History:  Procedure Laterality Date  . ABDOMINAL HYSTERECTOMY    . BREAST BIOPSY Left 2001   POS  . BREAST BIOPSY Left 2021   positive  . BREAST LUMPECTOMY Left 2001  . PORTACATH PLACEMENT Right 05/16/2019   Procedure: INSERTION PORT-A-CATH;  Surgeon: Jules Husbands, MD;  Location: [facility] ORS;  Service: General;  Laterality: Right;    Social History   Socioeconomic History  . Marital status: Divorced    Spouse name: Not on file  . Number of children: Not on file  . Years of education: Not on file  . Highest education level: Not on file  Occupational History  . Not on file  Tobacco Use  . Smoking status: Former Smoker    Packs/day: 0.50    Years: 5.00    Pack years: 2.50    Types: Cigarettes    Quit date: 1970    Years since quitting: 51.4  . Smokeless tobacco: Never Used  Vaping Use  . Vaping Use: Never used  Substance and Sexual Activity  . Alcohol use: Never  . Drug use: Never  . Sexual activity: Not Currently  Other Topics Concern  . Not on file  Social History Narrative  . Not on file   Social Determinants of Health   Financial Resource Strain:   . Difficulty of Paying Living Expenses:   Food Insecurity:   . Worried About Charity fundraiser in the Last Year:   . Arboriculturist in the Last Year:   Transportation Needs:   . Film/video editor (Medical):   Marland Kitchen Lack of Transportation (Non-Medical):   Physical Activity:   . Days of Exercise per Week:   . Minutes of Exercise per Session:   Stress:   . Feeling of Stress :   Social Connections:   . Frequency of Communication with Friends and Family:   . Frequency of Social Gatherings with Friends and Family:   . Attends Religious Services:   . Active Member of Clubs or Organizations:   . Attends Archivist Meetings:   Marland Kitchen Marital Status:   Intimate  Partner Violence:   . Fear of Current or Ex-Partner:   . Emotionally Abused:   Marland Kitchen Physically Abused:   . Sexually Abused:     Family History  Problem Relation Age of Onset  . Hypertension Mother   . Prostate cancer Paternal Uncle   . Breast cancer Neg Hx      Current Outpatient Medications:  .  acetaminophen (TYLENOL) 500 MG tablet, Take 500 mg by mouth every 6 (six) hours as needed., Disp: , Rfl:  .  cholecalciferol (VITAMIN D3) 25 MCG (1000 UNIT) tablet, Take 1,000 Units by mouth daily., Disp: , Rfl:  .  dexamethasone (DECADRON) 4 MG tablet, TAKE 2 TABLETS BY MOUTH ONCE A DAY ON THE DAY AFTER CHEMOTHERAPY AND THEN 2 TABLETS TWICE DAILY FOR 2 DAYS. TAKE WITH FOOD., Disp: 30 tablet, Rfl: 11 .  DULoxetine (CYMBALTA) 30 MG capsule, Take 1 capsule (30 mg total) by mouth daily. Increase to 60 mg daily after 1 week, Disp: 60 capsule, Rfl: 3 .  hydrochlorothiazide (HYDRODIURIL) 25 MG tablet, Take 25 mg by mouth daily., Disp: , Rfl:  .  latanoprost (XALATAN) 0.005 % ophthalmic solution,  Place 1 drop into both eyes at bedtime. , Disp: , Rfl:  .  lidocaine-prilocaine (EMLA) cream, Apply to affected area once, Disp: 30 g, Rfl: 3 .  loperamide (IMODIUM A-D) 2 MG tablet, Take 2 mg by mouth 4 (four) times daily as needed for diarrhea or loose stools., Disp: , Rfl:  .  LORazepam (ATIVAN) 0.5 MG tablet, Take 1 tablet (0.5 mg total) by mouth every 6 (six) hours as needed (Nausea or vomiting)., Disp: 30 tablet, Rfl: 0 .  losartan (COZAAR) 100 MG tablet, Take 100 mg by mouth daily., Disp: , Rfl:  .  ondansetron (ZOFRAN) 8 MG tablet, Take 1 tablet (8 mg total) by mouth 2 (two) times daily as needed. Start on the third day after chemotherapy., Disp: 30 tablet, Rfl: 1 .  pantoprazole (PROTONIX) 20 MG tablet, TAKE 1 TABLET BY MOUTH ONCE DAILY., Disp: 30 tablet, Rfl: 11 .  potassium chloride SA (KLOR-CON) 20 MEQ tablet, Take 1 tablet (20 mEq total) by mouth 3 (three) times daily., Disp: 90 tablet, Rfl: 3 .   prochlorperazine (COMPAZINE) 10 MG tablet, Take 1 tablet (10 mg total) by mouth every 6 (six) hours as needed (Nausea or vomiting)., Disp: 30 tablet, Rfl: 1 .  timolol (TIMOPTIC) 0.5 % ophthalmic solution, Place 1 drop into both eyes daily. , Disp: , Rfl:   Physical exam:  Vitals:   09/25/19 1424  BP: 124/90  Pulse: (!) 120  Temp: 97.8 F (36.6 C)  SpO2: 100%  Weight: 172 lb 6.4 oz (78.2 kg)   Physical Exam Constitutional:      General: She is not in acute distress. Cardiovascular:     Rate and Rhythm: Regular rhythm. Tachycardia present.     Heart sounds: Normal heart sounds.  Pulmonary:     Effort: Pulmonary effort is normal.     Breath sounds: Normal breath sounds.  Abdominal:     General: Bowel sounds are normal.     Palpations: Abdomen is soft.  Skin:    General: Skin is warm and dry.  Neurological:     Mental Status: She is alert and oriented to person, place, and time.      CMP Latest Ref Rng & Units 09/25/2019  Glucose 70 - 99 mg/dL 123(H)  BUN 8 - 23 mg/dL 25(H)  Creatinine 0.44 - 1.00 mg/dL 0.85  Sodium 135 - 145 mmol/L 133(L)  Potassium 3.5 - 5.1 mmol/L 3.3(L)  Chloride 98 - 111 mmol/L 100  CO2 22 - 32 mmol/L 24  Calcium 8.9 - 10.3 mg/dL 8.7(L)  Total Protein 6.5 - 8.1 g/dL 6.8  Total Bilirubin 0.3 - 1.2 mg/dL 0.5  Alkaline Phos 38 - 126 U/L 75  AST 15 - 41 U/L 19  ALT 0 - 44 U/L 25   CBC Latest Ref Rng & Units 09/25/2019  WBC 4.0 - 10.5 K/uL 2.4(L)  Hemoglobin 12.0 - 15.0 g/dL 9.8(L)  Hematocrit 36 - 46 % 29.3(L)  Platelets 150 - 400 K/uL 168     Assessment and plan- Patient is a 64 y.o. female clinical prognostic stage Ia invasive mammary carcinoma cT1 ccN0 cM0 ER/PR negative and HER-2/neu negative.   She is here for on treatment assessment prior to cycle 10 of weekly carbotaxol chemotherapy  Patient's white cell count is low at 2.4 today with an ANC of 1.3.  Hemoglobin is remaining stable between 8.5-9.5 and platelet counts are normal.  Her  insurance has not approved growth factor support and I will therefore hold off  on giving her chemotherapy today.  She will directly proceed for cycle 10 of chemotherapy next week and I will see her back in 2 weeks time for cycle 11  Patient will be finishing chemotherapy in 3 weeks time and will need a bilateral mastectomy following that.  I will get in touch with Dr. Dahlia Byes regarding that.  Chemo-induced anemia: Currently hemoglobin is stable between 7.5-9.5.  She has not required any blood transfusions so far.  Continue to monitor Patient is tachycardic today with a low potassium.  We will give her 1 L of IV fluids today along with 20 mEq of IV potassium.  She will continue to take oral potassium 60 mEq daily which she has been doing for the last month at home. Chemo-induced peripheral neuropathy: I have encouraged the patient to pick up her Cymbalta prescription. We are lowering the dose of Taxol to 65 mg/m   Visit Diagnosis 1. Encounter for antineoplastic chemotherapy   2. Chemotherapy induced neutropenia ([facility])   3. Chemotherapy-induced peripheral neuropathy ([place])   4. Tachycardia   5. Malignant neoplasm of lower-inner quadrant of left breast in female, estrogen receptor negative ([place])   6. Hypokalemia      Dr. Randa Evens, MD, MPH [facility] [phone] 09/28/2019 6:46 AM

## 2019-09-29 NOTE — Progress Notes (Signed)
PSN spoke with patient today.  Patient is no longer working, so she is unable to pay for some of her monthly expenses.  PSN will assist patient with some of her monthly expenses through the [facility].  PSN also referred patient to [facility] patient financial services to discuss applying for assistance with her medical bills.

## 2019-10-02 ENCOUNTER — Other Ambulatory Visit: Payer: Self-pay

## 2019-10-02 ENCOUNTER — Inpatient Hospital Stay: Payer: BC Managed Care – PPO

## 2019-10-02 VITALS — BP 135/81 | HR 79 | Temp 96.6°F | Resp 16 | Wt 175.2 lb

## 2019-10-02 DIAGNOSIS — Z171 Estrogen receptor negative status [ER-]: Secondary | ICD-10-CM

## 2019-10-02 DIAGNOSIS — C50312 Malignant neoplasm of lower-inner quadrant of left female breast: Secondary | ICD-10-CM | POA: Diagnosis not present

## 2019-10-02 LAB — COMPREHENSIVE METABOLIC PANEL
ALT: 24 U/L (ref 0–44)
AST: 20 U/L (ref 15–41)
Albumin: 3.5 g/dL (ref 3.5–5.0)
Alkaline Phosphatase: 81 U/L (ref 38–126)
Anion gap: 10 (ref 5–15)
BUN: 17 mg/dL (ref 8–23)
CO2: 25 mmol/L (ref 22–32)
Calcium: 9.1 mg/dL (ref 8.9–10.3)
Chloride: 104 mmol/L (ref 98–111)
Creatinine, Ser: 0.76 mg/dL (ref 0.44–1.00)
GFR calc Af Amer: >60 mL/min (ref >60)
GFR calc non Af Amer: >60 mL/min (ref >60)
Glucose, Bld: 104 mg/dL — ABNORMAL HIGH (ref 70–99)
Potassium: 3.5 mmol/L (ref 3.5–5.1)
Sodium: 139 mmol/L (ref 135–145)
Total Bilirubin: 0.6 mg/dL (ref 0.3–1.2)
Total Protein: 6.9 g/dL (ref 6.5–8.1)

## 2019-10-02 LAB — CBC WITH DIFFERENTIAL/PLATELET
Abs Immature Granulocytes: 0.1 10*3/uL — ABNORMAL HIGH (ref 0.00–0.07)
Basophils Absolute: 0 10*3/uL (ref 0.0–0.1)
Basophils Relative: 0 %
Eosinophils Absolute: 0 10*3/uL (ref 0.0–0.5)
Eosinophils Relative: 0 %
HCT: 26.8 % — ABNORMAL LOW (ref 36.0–46.0)
Hemoglobin: 8.6 g/dL — ABNORMAL LOW (ref 12.0–15.0)
Immature Granulocytes: 4 %
Lymphocytes Relative: 37 %
Lymphs Abs: 1 10*3/uL (ref 0.7–4.0)
MCH: 30 pg (ref 26.0–34.0)
MCHC: 32.1 g/dL (ref 30.0–36.0)
MCV: 93.4 fL (ref 80.0–100.0)
Monocytes Absolute: 0.4 10*3/uL (ref 0.1–1.0)
Monocytes Relative: 16 %
Neutro Abs: 1.2 10*3/uL — ABNORMAL LOW (ref 1.7–7.7)
Neutrophils Relative %: 43 %
Platelets: 215 10*3/uL (ref 150–400)
RBC: 2.87 MIL/uL — ABNORMAL LOW (ref 3.87–5.11)
RDW: 17.6 % — ABNORMAL HIGH (ref 11.5–15.5)
Smear Review: ADEQUATE
WBC: 2.7 10*3/uL — ABNORMAL LOW (ref 4.0–10.5)
nRBC: 3.7 % — ABNORMAL HIGH (ref 0.0–0.2)

## 2019-10-02 MED ORDER — SODIUM CHLORIDE 0.9 % IV SOLN
Freq: Once | INTRAVENOUS | Status: AC
  Filled 2019-10-02: qty 250

## 2019-10-02 MED ORDER — FAMOTIDINE IN NACL 20-0.9 MG/50ML-% IV SOLN
20.0000 mg | Freq: Once | INTRAVENOUS | Status: AC
  Administered 2019-10-02: 20 mg via INTRAVENOUS
  Filled 2019-10-02: qty 50

## 2019-10-02 MED ORDER — SODIUM CHLORIDE 0.9 % IV SOLN
65.0000 mg/m2 | Freq: Once | INTRAVENOUS | Status: AC
  Administered 2019-10-02: 120 mg via INTRAVENOUS
  Filled 2019-10-02: qty 20

## 2019-10-02 MED ORDER — SODIUM CHLORIDE 0.9 % IV SOLN
20.0000 mg | Freq: Once | INTRAVENOUS | Status: AC
  Administered 2019-10-02: 20 mg via INTRAVENOUS
  Filled 2019-10-02: qty 20

## 2019-10-02 MED ORDER — DIPHENHYDRAMINE HCL 50 MG/ML IJ SOLN
50.0000 mg | Freq: Once | INTRAMUSCULAR | Status: AC
  Administered 2019-10-02: 50 mg via INTRAVENOUS
  Filled 2019-10-02: qty 1

## 2019-10-02 MED ORDER — SODIUM CHLORIDE 0.9 % IV SOLN
160.0000 mg | Freq: Once | INTRAVENOUS | Status: AC
  Administered 2019-10-02: 160 mg via INTRAVENOUS
  Filled 2019-10-02: qty 16

## 2019-10-02 MED ORDER — HEPARIN SOD (PORK) LOCK FLUSH 100 UNIT/ML IV SOLN
500.0000 [IU] | Freq: Once | INTRAVENOUS | Status: AC | PRN
  Administered 2019-10-02: 500 [IU]
  Filled 2019-10-02: qty 5

## 2019-10-02 NOTE — Progress Notes (Signed)
WBC 2.7, ANC 1.2, Per Dr. Janese Banks okay to proceed with scheduled Taxol and Carboplatin treatment.

## 2019-10-03 ENCOUNTER — Other Ambulatory Visit: Payer: Self-pay

## 2019-10-03 ENCOUNTER — Other Ambulatory Visit: Payer: Self-pay | Admitting: Oncology

## 2019-10-03 ENCOUNTER — Inpatient Hospital Stay: Payer: BC Managed Care – PPO

## 2019-10-03 VITALS — BP 160/90

## 2019-10-03 DIAGNOSIS — T451X5A Adverse effect of antineoplastic and immunosuppressive drugs, initial encounter: Secondary | ICD-10-CM

## 2019-10-03 DIAGNOSIS — C50312 Malignant neoplasm of lower-inner quadrant of left female breast: Secondary | ICD-10-CM | POA: Diagnosis not present

## 2019-10-03 MED ORDER — FILGRASTIM-SNDZ 480 MCG/0.8ML IJ SOSY
480.0000 ug | PREFILLED_SYRINGE | Freq: Every day | INTRAMUSCULAR | Status: DC
  Administered 2019-10-03: 480 ug via SUBCUTANEOUS

## 2019-10-06 ENCOUNTER — Other Ambulatory Visit: Payer: Self-pay

## 2019-10-06 ENCOUNTER — Other Ambulatory Visit: Payer: Self-pay | Admitting: *Deleted

## 2019-10-06 ENCOUNTER — Inpatient Hospital Stay: Payer: BC Managed Care – PPO

## 2019-10-06 VITALS — BP 159/95 | HR 69

## 2019-10-06 DIAGNOSIS — D701 Agranulocytosis secondary to cancer chemotherapy: Secondary | ICD-10-CM

## 2019-10-06 DIAGNOSIS — C50312 Malignant neoplasm of lower-inner quadrant of left female breast: Secondary | ICD-10-CM | POA: Diagnosis not present

## 2019-10-06 DIAGNOSIS — T451X5A Adverse effect of antineoplastic and immunosuppressive drugs, initial encounter: Secondary | ICD-10-CM

## 2019-10-06 DIAGNOSIS — Z171 Estrogen receptor negative status [ER-]: Secondary | ICD-10-CM

## 2019-10-06 MED ORDER — FILGRASTIM-SNDZ 480 MCG/0.8ML IJ SOSY
480.0000 ug | PREFILLED_SYRINGE | Freq: Every day | INTRAMUSCULAR | Status: DC
  Administered 2019-10-06: 480 ug via SUBCUTANEOUS
  Filled 2019-10-06: qty 0.8

## 2019-10-07 ENCOUNTER — Inpatient Hospital Stay: Payer: BC Managed Care – PPO

## 2019-10-07 ENCOUNTER — Ambulatory Visit: Payer: BC Managed Care – PPO

## 2019-10-07 ENCOUNTER — Other Ambulatory Visit: Payer: BC Managed Care – PPO

## 2019-10-07 DIAGNOSIS — T451X5A Adverse effect of antineoplastic and immunosuppressive drugs, initial encounter: Secondary | ICD-10-CM

## 2019-10-07 DIAGNOSIS — Z171 Estrogen receptor negative status [ER-]: Secondary | ICD-10-CM

## 2019-10-07 DIAGNOSIS — C50312 Malignant neoplasm of lower-inner quadrant of left female breast: Secondary | ICD-10-CM | POA: Diagnosis not present

## 2019-10-07 LAB — CBC WITH DIFFERENTIAL/PLATELET
Abs Immature Granulocytes: 1.74 10*3/uL — ABNORMAL HIGH (ref 0.00–0.07)
Basophils Absolute: 0.1 10*3/uL (ref 0.0–0.1)
Basophils Relative: 1 %
Eosinophils Absolute: 0 10*3/uL (ref 0.0–0.5)
Eosinophils Relative: 0 %
HCT: 27.5 % — ABNORMAL LOW (ref 36.0–46.0)
Hemoglobin: 9.1 g/dL — ABNORMAL LOW (ref 12.0–15.0)
Immature Granulocytes: 12 %
Lymphocytes Relative: 14 %
Lymphs Abs: 2 10*3/uL (ref 0.7–4.0)
MCH: 30.7 pg (ref 26.0–34.0)
MCHC: 33.1 g/dL (ref 30.0–36.0)
MCV: 92.9 fL (ref 80.0–100.0)
Monocytes Absolute: 1.1 10*3/uL — ABNORMAL HIGH (ref 0.1–1.0)
Monocytes Relative: 8 %
Neutro Abs: 9.4 10*3/uL — ABNORMAL HIGH (ref 1.7–7.7)
Neutrophils Relative %: 65 %
Platelets: 233 10*3/uL (ref 150–400)
RBC: 2.96 MIL/uL — ABNORMAL LOW (ref 3.87–5.11)
RDW: 17.2 % — ABNORMAL HIGH (ref 11.5–15.5)
WBC: 14.3 10*3/uL — ABNORMAL HIGH (ref 4.0–10.5)
nRBC: 1 % — ABNORMAL HIGH (ref 0.0–0.2)

## 2019-10-09 ENCOUNTER — Encounter: Payer: Self-pay | Admitting: Oncology

## 2019-10-09 ENCOUNTER — Inpatient Hospital Stay: Payer: BC Managed Care – PPO

## 2019-10-09 ENCOUNTER — Other Ambulatory Visit: Payer: Self-pay

## 2019-10-09 ENCOUNTER — Inpatient Hospital Stay (HOSPITAL_BASED_OUTPATIENT_CLINIC_OR_DEPARTMENT_OTHER): Payer: BC Managed Care – PPO | Admitting: Oncology

## 2019-10-09 VITALS — BP 132/93 | HR 92 | Temp 98.2°F | Resp 18 | Wt 176.7 lb

## 2019-10-09 DIAGNOSIS — T451X5A Adverse effect of antineoplastic and immunosuppressive drugs, initial encounter: Secondary | ICD-10-CM

## 2019-10-09 DIAGNOSIS — E876 Hypokalemia: Secondary | ICD-10-CM | POA: Diagnosis not present

## 2019-10-09 DIAGNOSIS — Z5111 Encounter for antineoplastic chemotherapy: Secondary | ICD-10-CM | POA: Diagnosis not present

## 2019-10-09 DIAGNOSIS — D6481 Anemia due to antineoplastic chemotherapy: Secondary | ICD-10-CM

## 2019-10-09 DIAGNOSIS — Z171 Estrogen receptor negative status [ER-]: Secondary | ICD-10-CM

## 2019-10-09 DIAGNOSIS — C50312 Malignant neoplasm of lower-inner quadrant of left female breast: Secondary | ICD-10-CM | POA: Diagnosis not present

## 2019-10-09 DIAGNOSIS — Z95828 Presence of other vascular implants and grafts: Secondary | ICD-10-CM

## 2019-10-09 LAB — COMPREHENSIVE METABOLIC PANEL
ALT: 21 U/L (ref 0–44)
AST: 17 U/L (ref 15–41)
Albumin: 3.5 g/dL (ref 3.5–5.0)
Alkaline Phosphatase: 100 U/L (ref 38–126)
Anion gap: 12 (ref 5–15)
BUN: 13 mg/dL (ref 8–23)
CO2: 22 mmol/L (ref 22–32)
Calcium: 8.9 mg/dL (ref 8.9–10.3)
Chloride: 100 mmol/L (ref 98–111)
Creatinine, Ser: 0.72 mg/dL (ref 0.44–1.00)
GFR calc Af Amer: >60 mL/min (ref >60)
GFR calc non Af Amer: >60 mL/min (ref >60)
Glucose, Bld: 157 mg/dL — ABNORMAL HIGH (ref 70–99)
Potassium: 3.2 mmol/L — ABNORMAL LOW (ref 3.5–5.1)
Sodium: 134 mmol/L — ABNORMAL LOW (ref 135–145)
Total Bilirubin: 0.5 mg/dL (ref 0.3–1.2)
Total Protein: 6.5 g/dL (ref 6.5–8.1)

## 2019-10-09 LAB — CBC WITH DIFFERENTIAL/PLATELET
Abs Immature Granulocytes: 1.05 10*3/uL — ABNORMAL HIGH (ref 0.00–0.07)
Basophils Absolute: 0.1 10*3/uL (ref 0.0–0.1)
Basophils Relative: 1 %
Eosinophils Absolute: 0 10*3/uL (ref 0.0–0.5)
Eosinophils Relative: 0 %
HCT: 27.9 % — ABNORMAL LOW (ref 36.0–46.0)
Hemoglobin: 9.1 g/dL — ABNORMAL LOW (ref 12.0–15.0)
Immature Granulocytes: 13 %
Lymphocytes Relative: 16 %
Lymphs Abs: 1.4 10*3/uL (ref 0.7–4.0)
MCH: 30.4 pg (ref 26.0–34.0)
MCHC: 32.6 g/dL (ref 30.0–36.0)
MCV: 93.3 fL (ref 80.0–100.0)
Monocytes Absolute: 0.7 10*3/uL (ref 0.1–1.0)
Monocytes Relative: 8 %
Neutro Abs: 5.2 10*3/uL (ref 1.7–7.7)
Neutrophils Relative %: 62 %
Platelets: 207 10*3/uL (ref 150–400)
RBC: 2.99 MIL/uL — ABNORMAL LOW (ref 3.87–5.11)
RDW: 16.9 % — ABNORMAL HIGH (ref 11.5–15.5)
Smear Review: NORMAL
WBC: 8.4 10*3/uL (ref 4.0–10.5)
nRBC: 5 % — ABNORMAL HIGH (ref 0.0–0.2)

## 2019-10-09 MED ORDER — SODIUM CHLORIDE 0.9 % IV SOLN
20.0000 mg | Freq: Once | INTRAVENOUS | Status: AC
  Administered 2019-10-09: 20 mg via INTRAVENOUS
  Filled 2019-10-09: qty 20

## 2019-10-09 MED ORDER — DIPHENHYDRAMINE HCL 50 MG/ML IJ SOLN
50.0000 mg | Freq: Once | INTRAMUSCULAR | Status: AC
  Administered 2019-10-09: 50 mg via INTRAVENOUS
  Filled 2019-10-09: qty 1

## 2019-10-09 MED ORDER — FAMOTIDINE IN NACL 20-0.9 MG/50ML-% IV SOLN
20.0000 mg | Freq: Once | INTRAVENOUS | Status: AC
  Administered 2019-10-09: 20 mg via INTRAVENOUS
  Filled 2019-10-09: qty 50

## 2019-10-09 MED ORDER — SODIUM CHLORIDE 0.9 % IV SOLN
Freq: Once | INTRAVENOUS | Status: AC
  Filled 2019-10-09: qty 250

## 2019-10-09 MED ORDER — SODIUM CHLORIDE 0.9% FLUSH
10.0000 mL | INTRAVENOUS | Status: AC | PRN
  Administered 2019-10-09: 10 mL via INTRAVENOUS
  Filled 2019-10-09: qty 10

## 2019-10-09 MED ORDER — HEPARIN SOD (PORK) LOCK FLUSH 100 UNIT/ML IV SOLN
INTRAVENOUS | Status: AC
  Filled 2019-10-09: qty 5

## 2019-10-09 MED ORDER — HEPARIN SOD (PORK) LOCK FLUSH 100 UNIT/ML IV SOLN
500.0000 [IU] | Freq: Once | INTRAVENOUS | Status: AC | PRN
  Administered 2019-10-09: 500 [IU]
  Filled 2019-10-09: qty 5

## 2019-10-09 MED ORDER — SODIUM CHLORIDE 0.9 % IV SOLN
160.0000 mg | Freq: Once | INTRAVENOUS | Status: AC
  Administered 2019-10-09: 160 mg via INTRAVENOUS
  Filled 2019-10-09: qty 16

## 2019-10-09 MED ORDER — SODIUM CHLORIDE 0.9 % IV SOLN
65.0000 mg/m2 | Freq: Once | INTRAVENOUS | Status: AC
  Administered 2019-10-09: 120 mg via INTRAVENOUS
  Filled 2019-10-09: qty 20

## 2019-10-09 MED ORDER — SODIUM CHLORIDE 0.9 % IV SOLN
Freq: Once | INTRAVENOUS | Status: AC
  Filled 2019-10-09: qty 1000

## 2019-10-11 NOTE — Progress Notes (Signed)
Hematology/Oncology Consult note [place]  Telephone:([phone] [phone]  Patient Care Team: Lanny Hurst, MD as PCP - General (Internal Medicine) Rico Junker, RN as Registered Nurse Theodore Demark, RN as Registered Nurse Sindy Guadeloupe, MD as Consulting Physician (Oncology)   Name of the patient: Molly Walters  [id number]  21-Aug-1955   Date of visit: 10/11/19  Diagnosis- clinical prognostic stage Ia invasive mammary carcinoma cmT1 ccN0 cM0 ER/PR negative and HER-2/neu negative  Chief complaint/ Reason for visit-on treatment assessment prior to cycle 11 of weekly carbotaxol chemotherapy  Heme/Onc history: patient is a 64 year old African-American female with past medical history significant for hypertension. She has had prior breast cancer in 2001 s/p lumpectomy followed by adjuvant radiation treatment. She did not require adjuvant chemotherapy. She does not remember taking any endocrine therapy as well.More recently patient underwent a bilateral screening mammogram in November 2020 which showed a possible mass in the left breast. This was followed by a diagnostic mammogram and ultrasound which showed a 1.6 cm mass at the 7 o'clock position in the left breast near the lumpectomy scar. No left axillary adenopathy. 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 Patient has seen Dr. Gretchen Short referred to [place] for further management.Patient is independent of her ADLs and IADLs. Family history significant for prostate cancer in her maternal uncle. No other family history of breast, ovarian pancreatic colon cancer or melanoma.  MRI bilateral breast showed:The size of the primary left breast mass appears larger on MRI close to 2.4 x 1.9 x 1.7 cm. She is also noted to have 2 additional enhancing masses 9 x 6 x 10 mm and 5 x 5 x 5 mm in the left breast with suspicious washout  kinetics. No pathological left axillary adenopathy. Prominent lymph nodes seen on the right side along with non-mass enhancement spanning 5.3 cm in the AP dimension.Both the enhancing masses in the left breast were positive for triple negative breast cancer. Right sided axillary lymph node was negative for malignancy.Right breast biopsy showed DCIS  Patient has completed neoadjuvant dose dense AC chemotherapy and is currently on weekly carbotaxol chemotherapy   Interval history-reports ongoing fatigue.  Reports improvement in her neuropathy symptoms after she had a break from Taxol for a week.  She has picked up her Cymbalta but is not using it consistently.  ECOG PS- 1 Pain scale- 0   Review of systems- Review of Systems  Constitutional: Positive for malaise/fatigue. Negative for chills, fever and weight loss.  HENT: Negative for congestion, ear discharge and nosebleeds.   Eyes: Negative for blurred vision.  Respiratory: Negative for cough, hemoptysis, sputum production, shortness of breath and wheezing.   Cardiovascular: Negative for chest pain, palpitations, orthopnea and claudication.  Gastrointestinal: Negative for abdominal pain, blood in stool, constipation, diarrhea, heartburn, melena, nausea and vomiting.  Genitourinary: Negative for dysuria, flank pain, frequency, hematuria and urgency.  Musculoskeletal: Negative for back pain, joint pain and myalgias.       Right knee pain  Skin: Negative for rash.  Neurological: Negative for dizziness, tingling, focal weakness, seizures, weakness and headaches.  Endo/Heme/Allergies: Does not bruise/bleed easily.  Psychiatric/Behavioral: Negative for depression and suicidal ideas. The patient does not have insomnia.       No Known Allergies   Past Medical History:  Diagnosis Date  . Breast cancer ([place]) 2001   left breast lumpectomy and rad tx  . Breast cancer (  [place]) 02/2019   left breast mass w/ biopsy  . Hypertension   .  Personal history of radiation therapy 2001   BREAST CA     Past Surgical History:  Procedure Laterality Date  . ABDOMINAL HYSTERECTOMY    . BREAST BIOPSY Left 2001   POS  . BREAST BIOPSY Left 2021   positive  . BREAST LUMPECTOMY Left 2001  . PORTACATH PLACEMENT Right 05/16/2019   Procedure: INSERTION PORT-A-CATH;  Surgeon: Jules Husbands, MD;  Location: [facility] ORS;  Service: General;  Laterality: Right;    Social History   Socioeconomic History  . Marital status: Divorced    Spouse name: Not on file  . Number of children: Not on file  . Years of education: Not on file  . Highest education level: Not on file  Occupational History  . Not on file  Tobacco Use  . Smoking status: Former Smoker    Packs/day: 0.50    Years: 5.00    Pack years: 2.50    Types: Cigarettes    Quit date: 1970    Years since quitting: 51.5  . Smokeless tobacco: Never Used  Vaping Use  . Vaping Use: Never used  Substance and Sexual Activity  . Alcohol use: Never  . Drug use: Never  . Sexual activity: Not Currently  Other Topics Concern  . Not on file  Social History Narrative  . Not on file   Social Determinants of Health   Financial Resource Strain:   . Difficulty of Paying Living Expenses:   Food Insecurity:   . Worried About Charity fundraiser in the Last Year:   . Arboriculturist in the Last Year:   Transportation Needs:   . Film/video editor (Medical):   Marland Kitchen Lack of Transportation (Non-Medical):   Physical Activity:   . Days of Exercise per Week:   . Minutes of Exercise per Session:   Stress:   . Feeling of Stress :   Social Connections:   . Frequency of Communication with Friends and Family:   . Frequency of Social Gatherings with Friends and Family:   . Attends Religious Services:   . Active Member of Clubs or Organizations:   . Attends Archivist Meetings:   Marland Kitchen Marital Status:   Intimate Partner Violence:   . Fear of Current or Ex-Partner:   . Emotionally  Abused:   Marland Kitchen Physically Abused:   . Sexually Abused:     Family History  Problem Relation Age of Onset  . Hypertension Mother   . Prostate cancer Paternal Uncle   . Breast cancer Neg Hx      Current Outpatient Medications:  .  acetaminophen (TYLENOL) 500 MG tablet, Take 500 mg by mouth every 6 (six) hours as needed., Disp: , Rfl:  .  cholecalciferol (VITAMIN D3) 25 MCG (1000 UNIT) tablet, Take 1,000 Units by mouth daily., Disp: , Rfl:  .  dexamethasone (DECADRON) 4 MG tablet, TAKE 2 TABLETS BY MOUTH ONCE A DAY ON THE DAY AFTER CHEMOTHERAPY AND THEN 2 TABLETS TWICE DAILY FOR 2 DAYS. TAKE WITH FOOD., Disp: 30 tablet, Rfl: 11 .  DULoxetine (CYMBALTA) 30 MG capsule, Take 1 capsule (30 mg total) by mouth daily. Increase to 60 mg daily after 1 week, Disp: 60 capsule, Rfl: 3 .  hydrochlorothiazide (HYDRODIURIL) 25 MG tablet, Take 25 mg by mouth daily., Disp: , Rfl:  .  latanoprost (XALATAN) 0.005 % ophthalmic solution, Place 1 drop into both eyes  at bedtime. , Disp: , Rfl:  .  lidocaine-prilocaine (EMLA) cream, Apply to affected area once, Disp: 30 g, Rfl: 3 .  loperamide (IMODIUM A-D) 2 MG tablet, Take 2 mg by mouth 4 (four) times daily as needed for diarrhea or loose stools., Disp: , Rfl:  .  LORazepam (ATIVAN) 0.5 MG tablet, Take 1 tablet (0.5 mg total) by mouth every 6 (six) hours as needed (Nausea or vomiting)., Disp: 30 tablet, Rfl: 0 .  losartan (COZAAR) 100 MG tablet, Take 100 mg by mouth daily., Disp: , Rfl:  .  ondansetron (ZOFRAN) 8 MG tablet, Take 1 tablet (8 mg total) by mouth 2 (two) times daily as needed. Start on the third day after chemotherapy., Disp: 30 tablet, Rfl: 1 .  pantoprazole (PROTONIX) 20 MG tablet, TAKE 1 TABLET BY MOUTH ONCE DAILY., Disp: 30 tablet, Rfl: 11 .  potassium chloride SA (KLOR-CON) 20 MEQ tablet, Take 1 tablet (20 mEq total) by mouth 3 (three) times daily., Disp: 90 tablet, Rfl: 3 .  prochlorperazine (COMPAZINE) 10 MG tablet, Take 1 tablet (10 mg total) by  mouth every 6 (six) hours as needed (Nausea or vomiting)., Disp: 30 tablet, Rfl: 1 .  timolol (TIMOPTIC) 0.5 % ophthalmic solution, Place 1 drop into both eyes daily. , Disp: , Rfl:  No current facility-administered medications for this visit.  Facility-Administered Medications Ordered in Other Visits:  .  sodium chloride flush (NS) 0.9 % injection 10 mL, 10 mL, Intravenous, PRN, Sindy Guadeloupe, MD, 10 mL at 10/09/19 0815  Physical exam:  Vitals:   10/09/19 0854  BP: (!) 132/93  Pulse: 92  Resp: 18  Temp: 98.2 F (36.8 C)  TempSrc: Tympanic  SpO2: 100%  Weight: 176 lb 11.2 oz (80.2 kg)   Physical Exam Constitutional:      Comments: Sitting in a wheelchair with brace over the right knee  Cardiovascular:     Rate and Rhythm: Regular rhythm. Tachycardia present.     Heart sounds: Normal heart sounds.  Pulmonary:     Effort: Pulmonary effort is normal.     Breath sounds: Normal breath sounds.  Abdominal:     General: Bowel sounds are normal.     Palpations: Abdomen is soft.  Skin:    General: Skin is warm and dry.  Neurological:     Mental Status: She is alert and oriented to person, place, and time.      CMP Latest Ref Rng & Units 10/09/2019  Glucose 70 - 99 mg/dL 157(H)  BUN 8 - 23 mg/dL 13  Creatinine 0.44 - 1.00 mg/dL 0.72  Sodium 135 - 145 mmol/L 134(L)  Potassium 3.5 - 5.1 mmol/L 3.2(L)  Chloride 98 - 111 mmol/L 100  CO2 22 - 32 mmol/L 22  Calcium 8.9 - 10.3 mg/dL 8.9  Total Protein 6.5 - 8.1 g/dL 6.5  Total Bilirubin 0.3 - 1.2 mg/dL 0.5  Alkaline Phos 38 - 126 U/L 100  AST 15 - 41 U/L 17  ALT 0 - 44 U/L 21   CBC Latest Ref Rng & Units 10/09/2019  WBC 4.0 - 10.5 K/uL 8.4  Hemoglobin 12.0 - 15.0 g/dL 9.1(L)  Hematocrit 36 - 46 % 27.9(L)  Platelets 150 - 400 K/uL 207     Assessment and plan- Patient is a 63 y.o. female  clinical prognostic stage Ia invasive mammary carcinoma cT1 ccN0 cM0 ER/PR negative and HER-2/neu negative. She is here for on treatment  assessment prior to cycle 11 of weekly carbotaxol  chemotherapy  Patient received Zarxio for leukopenia/neutropenia after cycle 10 of chemotherapy and today her white count is normal at 8.4.  She does not require Zarxio with this cycle.  Counts are otherwise okay to proceed with cycle 11 of carbotaxol chemotherapy today and she will directly proceed for cycle 12 next week which would be her last cycle.  I did get in touch with Dr. Dahlia Byes who will be arranging follow-up with her to discuss bilateral mastectomy.  Port can be taken out at the time of mastectomy  Hypokalemia: We will give her 1 L of IV fluids today with 20 mEq of IV potassium. She will receive IV fluids next week as well.  She will continue to take oral potassium as well  Chemo-induced anemia: Stable between 8 and 9.  Continue to monitor.  Likely to improve after chemotherapy is done  I will see her back in 3 weeks with labs CBC with differential and CMP Visit Diagnosis 1. Encounter for antineoplastic chemotherapy   2. Malignant neoplasm of lower-inner quadrant of left breast in female, estrogen receptor negative ([place])   3. Hypokalemia   4. Antineoplastic chemotherapy induced anemia      Dr. Randa Evens, MD, MPH [facility] [phone] 10/11/2019 7:27 AM

## 2019-10-13 ENCOUNTER — Other Ambulatory Visit: Payer: Self-pay

## 2019-10-13 ENCOUNTER — Ambulatory Visit: Payer: BC Managed Care – PPO | Admitting: Surgery

## 2019-10-13 ENCOUNTER — Encounter: Payer: Self-pay | Admitting: Surgery

## 2019-10-13 VITALS — BP 172/83 | HR 70 | Temp 94.3°F | Ht 66.0 in | Wt 178.8 lb

## 2019-10-13 DIAGNOSIS — C50312 Malignant neoplasm of lower-inner quadrant of left female breast: Secondary | ICD-10-CM | POA: Diagnosis not present

## 2019-10-13 DIAGNOSIS — D0511 Intraductal carcinoma in situ of right breast: Secondary | ICD-10-CM | POA: Diagnosis not present

## 2019-10-13 NOTE — Patient Instructions (Addendum)
Our surgery scheduler Pamala Hurry will contact you within the next 24-48 hours. During the call, she will discuss the preparation prior to surgery and she will also discuss the different dates and times for surgery. Please have the BLUE sheet available when she contacts you. If you have any questions regarding the surgery, please contact our office.   Surgical Options for Early-Stage Breast Cancer  Surgery is usually the first treatment for early-stage breast cancer. Most women have two surgery options. One is called partial mastectomy, or breast-sparing or breast-conserving surgery, and the other is called mastectomy. Both surgeries have good survival rates. Breast cancer is different for everyone, even in its early stage. The best treatment for one person might not be the best treatment for another. Learn as much as you can about your cancer and work closely with your health care providers to make the choice that produces the best results for you. What is partial mastectomy? Partial mastectomy, also called breast-sparing surgery or breast-conserving surgery, is surgery to remove the cancer along with some normal breast tissue that surrounds it. Lymph nodes from under the arm may also be removed and tested to find out if the cancer has spread. If cancer is located near the chest wall, part of the chest wall lining may also be removed. What is a mastectomy? A mastectomy is surgery to remove the cancer along with the entire breast tissue. There are several types of mastectomy:  Simple or total mastectomy. In this surgery the entire breast is removed, including breast tissue, nipple, areola and skin around the breast. Some lymph nodes may also be removed from under the arm. If cancer is located near the chest wall, part of the chest wall lining may also be removed.  Skin-sparing mastectomy. In this surgery the breast tissue, nipple, and areola are removed and most of the skin over the breast is left in place.  This surgery results in less scar tissue than other mastectomy surgeries, which allows for a more natural breast reconstruction.  Nipple-sparing mastectomy. In this surgery, breast tissue is removed but the skin and nipple is left in place. The tissue under the nipple and areola may be removed if cancer is found in the area. This may be an option for women who choose to have breast reconstruction after mastectomy.  Modified radical mastectomy. This surgery is the same as a simple mastectomy but also includes removing lymph nodes from under the arm (axillary lymph node dissection).  Radical mastectomy. In this surgery the entire breast, the lymph nodes under the arm, and the chest wall muscles under the breast are removed. This surgery is rarely done now. A modified radical mastectomy is preferred because it is just as effective, but with the added advantage of fewer side effects. What are some advantages and disadvantages of these surgeries? Partial mastectomy Advantages of partial mastectomy include:  Keeping most of your breast tissue intact, allowing for a more natural look to the breast.  Easier recovery when compared to a mastectomy.  Ability to go home on the day of the procedure. Disadvantages of partial mastectomy include:  Slightly higher risk that your cancer will come back.  Needing more surgery at a later time.  Requiring radiation therapy after surgery, which has side effects and possible complications. This is done to reduce the chances of breast cancer returning. Mastectomy Advantages of a mastectomy include:  Not needing to have radiation therapy or other treatments after surgery.  Lower chances of your cancer coming back.  Disadvantages of a mastectomy include:  Longer recovery time compared to partial mastectomy.  Possibility of more complications.  Requiring additional surgeries to reconstruct your breast. Where to find more information  [place]: https://www.cancer.gov  American Cancer Society: http://www.cancer.org Questions to ask Here are some questions to ask about each surgery:  What will my recovery be like?  How will my breast look and feel?  What are the possible risks and complications of the surgery?  What additional treatment might I need after surgery?  What are the risks and complications of radiation therapy?  What are the risks and complications of chemotherapy?  Will I be able to have breast reconstruction? Summary  Surgery is usually the first treatment for early-stage breast cancer. Most women have two surgery options.  One option is called partial mastectomy, or breast-sparing or breast-conserving surgery, and the other is called mastectomy. Both surgeries have good survival rates.  Each option has advantages and disadvantages to consider. The best treatment for one person might not be the best treatment for you.  Learn as much as you can about your cancer and work closely with your health care providers to make the choice that produces the best results for you. This information is not intended to replace advice given to you by your health care provider. Make sure you discuss any questions you have with your health care provider. Document Revised: 03/16/2017 Document Reviewed: 06/29/2016 Elsevier Patient Education  [place].

## 2019-10-14 ENCOUNTER — Encounter: Payer: Self-pay | Admitting: Surgery

## 2019-10-14 NOTE — H&P (View-Only) (Signed)
Outpatient Surgical Follow Up  10/14/2019  Legacy Carrender is an 65 y.o. female.   Chief Complaint  Patient presents with  . Follow-up    f/u est pt Invasive mammary Carcinoma -discuss bilateral mastectomy surgery-referred Dr.Roa    HPI: [name] is a 64 year old female well-known to me with diagnosis of triple negative left breast cancer that is about to complete neoadjuvant chemotherapy.  Did have a prior history of left breast cancer status post lumpectomy and radiation therapy.  More recently she did have an MRI showing evidence of an abnormality requiring a biopsy.  Please note that I have personally reviewed the MRI.  The pathology also shows DCIS.  He did develop leukopenia after cycle 10th of chemotherapy.  She is doing relatively well but she feels weak due to the chemo.  She is ready for definitive surgical management.  Dr. Janese Banks and I are in agreement that she is better served with bilateral mastectomy and sentinel lymph node biopsy on both sides.  Past Medical History:  Diagnosis Date  . Breast cancer ([place]) 2001   left breast lumpectomy and rad tx  . Breast cancer ([place]) 02/2019   left breast mass w/ biopsy  . Hypertension   . Personal history of radiation therapy 2001   BREAST CA    Past Surgical History:  Procedure Laterality Date  . ABDOMINAL HYSTERECTOMY    . BREAST BIOPSY Left 2001   POS  . BREAST BIOPSY Left 2021   positive  . BREAST LUMPECTOMY Left 2001  . PORTACATH PLACEMENT Right 05/16/2019   Procedure: INSERTION PORT-A-CATH;  Surgeon: Jules Husbands, MD;  Location: [facility] ORS;  Service: General;  Laterality: Right;    Family History  Problem Relation Age of Onset  . Hypertension Mother   . Prostate cancer Paternal Uncle   . Breast cancer Neg Hx     Social History:  reports that she quit smoking about 51 years ago. Her smoking use included cigarettes. She has a 2.50 pack-year smoking history. She has never used smokeless tobacco. She reports that she does not  drink alcohol and does not use drugs.  Allergies: No Known Allergies  Medications reviewed.    ROS Full ROS performed and is otherwise negative other than what is stated in HPI   BP (!) 172/83   Pulse 70   Temp (!) 94.3 F (34.6 C) (Temporal)   Ht 5\' 6"  (1.676 m)   Wt 178 lb 12.8 oz (81.1 kg)   SpO2 98%   BMI 28.86 kg/m   Physical Exam Vitals and nursing note reviewed. Exam conducted with a chaperone present.  Constitutional:      General: She is not in acute distress.    Appearance: Normal appearance. She is normal weight.  Eyes:     General: No scleral icterus.       Right eye: No discharge.        Left eye: No discharge.  Neck:     Vascular: No carotid bruit.  Cardiovascular:     Rate and Rhythm: Normal rate and regular rhythm.  Pulmonary:     Effort: Pulmonary effort is normal. No respiratory distress.     Breath sounds: Normal breath sounds. No stridor. No wheezing.     Comments: Left breast with prior lumpectomy.  I do not feel any palpable abnormality at this time.  There is no evidence of lymphadenopathy. Abdominal:     General: Abdomen is flat. There is no distension.     Palpations:  Abdomen is soft. There is no mass.     Tenderness: There is no abdominal tenderness. There is no rebound.     Hernia: No hernia is present.  Musculoskeletal:        General: No swelling or tenderness. Normal range of motion.     Cervical back: Normal range of motion and neck supple. No rigidity or tenderness.  Lymphadenopathy:     Cervical: No cervical adenopathy.  Skin:    General: Skin is warm and dry.     Capillary Refill: Capillary refill takes less than 2 seconds.  Neurological:     General: No focal deficit present.     Mental Status: She is alert and oriented to person, place, and time.  Psychiatric:        Mood and Affect: Mood normal.        Behavior: Behavior normal.        Thought Content: Thought content normal.        Judgment: Judgment normal.       Assessment/Plan: This  is a 79 -year-old female with triple negative invasive carcinoma of the left breast on a patient with prior Left lumpectomy and radiation therapy and and newly diagnosed right ductal carcinoma in situ.  She is about to complete neoadjuvant chemotherapy and was here for discussion of further surgical therapy.  I do recommend bilateral mastectomies with sentinel lymph node biopsy on both sides.  Currently the patient is not interested in immediate reconstruction.  Procedure discussed with the patient in detail.  Risks, benefits and possible complications including but not limited to: Bleeding, infection, seromas, lymphedema, nerve injuries and possible interventions.  She understands and wished to proceed.  Greater than 50% of the 45 minutes  visit was spent in counseling/coordination of care   Caroleen Hamman, MD [place]

## 2019-10-14 NOTE — Progress Notes (Signed)
Outpatient Surgical Follow Up  10/14/2019  Molly Walters is an 64 y.o. female.   Chief Complaint  Patient presents with  . Follow-up    f/u est pt Invasive mammary Carcinoma -discuss bilateral mastectomy surgery-referred Dr.Roa    HPI: Molly Walters is a 64 year old female well-known to me with diagnosis of triple negative left breast cancer that is about to complete neoadjuvant chemotherapy.  Did have a prior history of left breast cancer status post lumpectomy and radiation therapy.  More recently she did have an MRI showing evidence of an abnormality requiring a biopsy.  Please note that I have personally reviewed the MRI.  The pathology also shows DCIS.  He did develop leukopenia after cycle 10th of chemotherapy.  She is doing relatively well but she feels weak due to the chemo.  She is ready for definitive surgical management.  Dr. Janese Banks and I are in agreement that she is better served with bilateral mastectomy and sentinel lymph node biopsy on both sides.  Past Medical History:  Diagnosis Date  . Breast cancer (Molly Walters) 2001   left breast lumpectomy and rad tx  . Breast cancer (Molly Walters) 02/2019   left breast mass w/ biopsy  . Hypertension   . Personal history of radiation therapy 2001   BREAST CA    Past Surgical History:  Procedure Laterality Date  . ABDOMINAL HYSTERECTOMY    . BREAST BIOPSY Left 2001   POS  . BREAST BIOPSY Left 2021   positive  . BREAST LUMPECTOMY Left 2001  . PORTACATH PLACEMENT Right 05/16/2019   Procedure: INSERTION PORT-A-CATH;  Surgeon: Jules Husbands, MD;  Location: [facility] ORS;  Service: General;  Laterality: Right;    Family History  Problem Relation Age of Onset  . Hypertension Mother   . Prostate cancer Paternal Uncle   . Breast cancer Neg Hx     Social History:  reports that she quit smoking about 51 years ago. Her smoking use included cigarettes. She has a 2.50 pack-year smoking history. She has never used smokeless tobacco. She reports that she does not  drink alcohol and does not use drugs.  Allergies: No Known Allergies  Medications reviewed.    ROS Full ROS performed and is otherwise negative other than what is stated in HPI   BP (!) 172/83   Pulse 70   Temp (!) 94.3 F (34.6 C) (Temporal)   Ht 5\' 6"  (1.676 m)   Wt 178 lb 12.8 oz (81.1 kg)   SpO2 98%   BMI 28.86 kg/m   Physical Exam Vitals and nursing note reviewed. Exam conducted with a chaperone present.  Constitutional:      General: She is not in acute distress.    Appearance: Normal appearance. She is normal weight.  Eyes:     General: No scleral icterus.       Right eye: No discharge.        Left eye: No discharge.  Neck:     Vascular: No carotid bruit.  Cardiovascular:     Rate and Rhythm: Normal rate and regular rhythm.  Pulmonary:     Effort: Pulmonary effort is normal. No respiratory distress.     Breath sounds: Normal breath sounds. No stridor. No wheezing.     Comments: Left breast with prior lumpectomy.  I do not feel any palpable abnormality at this time.  There is no evidence of lymphadenopathy. Abdominal:     General: Abdomen is flat. There is no distension.     Palpations:  Abdomen is soft. There is no mass.     Tenderness: There is no abdominal tenderness. There is no rebound.     Hernia: No hernia is present.  Musculoskeletal:        General: No swelling or tenderness. Normal range of motion.     Cervical back: Normal range of motion and neck supple. No rigidity or tenderness.  Lymphadenopathy:     Cervical: No cervical adenopathy.  Skin:    General: Skin is warm and dry.     Capillary Refill: Capillary refill takes less than 2 seconds.  Neurological:     General: No focal deficit present.     Mental Status: She is alert and oriented to person, place, and time.  Psychiatric:        Mood and Affect: Mood normal.        Behavior: Behavior normal.        Thought Content: Thought content normal.        Judgment: Judgment normal.       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  She is about to complete neoadjuvant chemotherapy and was here for discussion of further surgical therapy.  I do recommend bilateral mastectomies with sentinel lymph node biopsy on both sides.  Currently the patient is not interested in immediate reconstruction.  Procedure discussed with the patient in detail.  Risks, benefits and possible complications including but not limited to: Bleeding, infection, seromas, lymphedema, nerve injuries and possible interventions.  She understands and wished to proceed.  Greater than 50% of the 45 minutes  visit was spent in counseling/coordination of care   Caroleen Hamman, MD [place] Surgeon

## 2019-10-15 ENCOUNTER — Other Ambulatory Visit: Payer: Self-pay | Admitting: Surgery

## 2019-10-15 ENCOUNTER — Telehealth: Payer: Self-pay | Admitting: Surgery

## 2019-10-15 DIAGNOSIS — C50312 Malignant neoplasm of lower-inner quadrant of left female breast: Secondary | ICD-10-CM

## 2019-10-15 DIAGNOSIS — C50919 Malignant neoplasm of unspecified site of unspecified female breast: Secondary | ICD-10-CM

## 2019-10-15 DIAGNOSIS — C50911 Malignant neoplasm of unspecified site of right female breast: Secondary | ICD-10-CM

## 2019-10-15 DIAGNOSIS — D0511 Intraductal carcinoma in situ of right breast: Secondary | ICD-10-CM

## 2019-10-15 NOTE — Telephone Encounter (Signed)
Outgoing call made, left message for patient to call, please inform patient of Pre-Admission date/time, COVID Testing date and Surgery date.  Surgery Date: 11/11/19 Preadmission Testing Date: 11/03/19 (phone 8a-1p) Covid Testing Date: 11/07/19 - patient advised to go to the [place] ([place] between 8a-1p   Also patient needs to call [phone], between 1-3:00pm the day before surgery, to find out what time to arrive for surgery.

## 2019-10-16 ENCOUNTER — Ambulatory Visit: Payer: BC Managed Care – PPO

## 2019-10-16 ENCOUNTER — Other Ambulatory Visit: Payer: BC Managed Care – PPO

## 2019-10-16 NOTE — Telephone Encounter (Signed)
Sister, Molly Walters called back.  They are aware of all dates regarding surgery and voices understanding.

## 2019-10-17 ENCOUNTER — Inpatient Hospital Stay: Payer: BC Managed Care – PPO

## 2019-10-17 ENCOUNTER — Inpatient Hospital Stay: Payer: BC Managed Care – PPO | Attending: Oncology

## 2019-10-17 ENCOUNTER — Other Ambulatory Visit: Payer: Self-pay

## 2019-10-17 VITALS — BP 117/77 | HR 87 | Temp 96.7°F | Resp 18 | Wt 173.0 lb

## 2019-10-17 DIAGNOSIS — Z87891 Personal history of nicotine dependence: Secondary | ICD-10-CM | POA: Diagnosis not present

## 2019-10-17 DIAGNOSIS — Z171 Estrogen receptor negative status [ER-]: Secondary | ICD-10-CM | POA: Insufficient documentation

## 2019-10-17 DIAGNOSIS — Z923 Personal history of irradiation: Secondary | ICD-10-CM | POA: Diagnosis not present

## 2019-10-17 DIAGNOSIS — H269 Unspecified cataract: Secondary | ICD-10-CM | POA: Diagnosis not present

## 2019-10-17 DIAGNOSIS — Z79899 Other long term (current) drug therapy: Secondary | ICD-10-CM | POA: Diagnosis not present

## 2019-10-17 DIAGNOSIS — R5383 Other fatigue: Secondary | ICD-10-CM | POA: Insufficient documentation

## 2019-10-17 DIAGNOSIS — C50312 Malignant neoplasm of lower-inner quadrant of left female breast: Secondary | ICD-10-CM | POA: Diagnosis not present

## 2019-10-17 DIAGNOSIS — Z5111 Encounter for antineoplastic chemotherapy: Secondary | ICD-10-CM | POA: Diagnosis not present

## 2019-10-17 DIAGNOSIS — I10 Essential (primary) hypertension: Secondary | ICD-10-CM | POA: Diagnosis not present

## 2019-10-17 DIAGNOSIS — D6481 Anemia due to antineoplastic chemotherapy: Secondary | ICD-10-CM | POA: Insufficient documentation

## 2019-10-17 DIAGNOSIS — G620 Drug-induced polyneuropathy: Secondary | ICD-10-CM | POA: Insufficient documentation

## 2019-10-17 DIAGNOSIS — Z853 Personal history of malignant neoplasm of breast: Secondary | ICD-10-CM | POA: Diagnosis not present

## 2019-10-17 DIAGNOSIS — R5381 Other malaise: Secondary | ICD-10-CM | POA: Diagnosis not present

## 2019-10-17 DIAGNOSIS — H538 Other visual disturbances: Secondary | ICD-10-CM | POA: Diagnosis not present

## 2019-10-17 DIAGNOSIS — T451X5A Adverse effect of antineoplastic and immunosuppressive drugs, initial encounter: Secondary | ICD-10-CM | POA: Diagnosis not present

## 2019-10-17 LAB — COMPREHENSIVE METABOLIC PANEL
ALT: 20 U/L (ref 0–44)
AST: 16 U/L (ref 15–41)
Albumin: 3.5 g/dL (ref 3.5–5.0)
Alkaline Phosphatase: 88 U/L (ref 38–126)
Anion gap: 11 (ref 5–15)
BUN: 18 mg/dL (ref 8–23)
CO2: 24 mmol/L (ref 22–32)
Calcium: 8.9 mg/dL (ref 8.9–10.3)
Chloride: 97 mmol/L — ABNORMAL LOW (ref 98–111)
Creatinine, Ser: 0.71 mg/dL (ref 0.44–1.00)
GFR calc Af Amer: >60 mL/min (ref >60)
GFR calc non Af Amer: >60 mL/min (ref >60)
Glucose, Bld: 113 mg/dL — ABNORMAL HIGH (ref 70–99)
Potassium: 3.3 mmol/L — ABNORMAL LOW (ref 3.5–5.1)
Sodium: 132 mmol/L — ABNORMAL LOW (ref 135–145)
Total Bilirubin: 0.4 mg/dL (ref 0.3–1.2)
Total Protein: 6.9 g/dL (ref 6.5–8.1)

## 2019-10-17 LAB — CBC WITH DIFFERENTIAL/PLATELET
Abs Immature Granulocytes: 0.26 10*3/uL — ABNORMAL HIGH (ref 0.00–0.07)
Basophils Absolute: 0 10*3/uL (ref 0.0–0.1)
Basophils Relative: 0 %
Eosinophils Absolute: 0 10*3/uL (ref 0.0–0.5)
Eosinophils Relative: 0 %
HCT: 29.6 % — ABNORMAL LOW (ref 36.0–46.0)
Hemoglobin: 10.1 g/dL — ABNORMAL LOW (ref 12.0–15.0)
Immature Granulocytes: 4 %
Lymphocytes Relative: 24 %
Lymphs Abs: 1.4 10*3/uL (ref 0.7–4.0)
MCH: 30.6 pg (ref 26.0–34.0)
MCHC: 34.1 g/dL (ref 30.0–36.0)
MCV: 89.7 fL (ref 80.0–100.0)
Monocytes Absolute: 0.5 10*3/uL (ref 0.1–1.0)
Monocytes Relative: 8 %
Neutro Abs: 3.7 10*3/uL (ref 1.7–7.7)
Neutrophils Relative %: 64 %
Platelets: 244 10*3/uL (ref 150–400)
RBC: 3.3 MIL/uL — ABNORMAL LOW (ref 3.87–5.11)
RDW: 17.3 % — ABNORMAL HIGH (ref 11.5–15.5)
WBC: 5.9 10*3/uL (ref 4.0–10.5)
nRBC: 5.3 % — ABNORMAL HIGH (ref 0.0–0.2)

## 2019-10-17 MED ORDER — SODIUM CHLORIDE 0.9 % IV SOLN
65.0000 mg/m2 | Freq: Once | INTRAVENOUS | Status: AC
  Administered 2019-10-17: 120 mg via INTRAVENOUS
  Filled 2019-10-17: qty 20

## 2019-10-17 MED ORDER — FAMOTIDINE IN NACL 20-0.9 MG/50ML-% IV SOLN
20.0000 mg | Freq: Once | INTRAVENOUS | Status: AC
  Administered 2019-10-17: 20 mg via INTRAVENOUS
  Filled 2019-10-17: qty 50

## 2019-10-17 MED ORDER — SODIUM CHLORIDE 0.9 % IV SOLN
160.0000 mg | Freq: Once | INTRAVENOUS | Status: AC
  Administered 2019-10-17: 160 mg via INTRAVENOUS
  Filled 2019-10-17: qty 16

## 2019-10-17 MED ORDER — SODIUM CHLORIDE 0.9 % IV SOLN
Freq: Once | INTRAVENOUS | Status: AC
  Filled 2019-10-17: qty 250

## 2019-10-17 MED ORDER — SODIUM CHLORIDE 0.9 % IV SOLN
20.0000 mg | Freq: Once | INTRAVENOUS | Status: AC
  Administered 2019-10-17: 20 mg via INTRAVENOUS
  Filled 2019-10-17: qty 20

## 2019-10-17 MED ORDER — SODIUM CHLORIDE 0.9 % IV SOLN
INTRAVENOUS | Status: AC
  Filled 2019-10-17: qty 1000

## 2019-10-17 MED ORDER — SODIUM CHLORIDE 0.9 % IV SOLN
Freq: Once | INTRAVENOUS | Status: AC
  Filled 2019-10-17: qty 1000

## 2019-10-17 MED ORDER — SODIUM CHLORIDE 0.9 % IV SOLN: INTRAVENOUS | Status: DC

## 2019-10-17 MED ORDER — HEPARIN SOD (PORK) LOCK FLUSH 100 UNIT/ML IV SOLN
INTRAVENOUS | Status: AC
  Filled 2019-10-17: qty 5

## 2019-10-17 MED ORDER — HEPARIN SOD (PORK) LOCK FLUSH 100 UNIT/ML IV SOLN
500.0000 [IU] | Freq: Once | INTRAVENOUS | Status: AC | PRN
  Administered 2019-10-17: 500 [IU]
  Filled 2019-10-17: qty 5

## 2019-10-17 MED ORDER — DIPHENHYDRAMINE HCL 50 MG/ML IJ SOLN
50.0000 mg | Freq: Once | INTRAMUSCULAR | Status: AC
  Administered 2019-10-17: 50 mg via INTRAVENOUS
  Filled 2019-10-17: qty 1

## 2019-10-17 NOTE — Progress Notes (Signed)
approx 1030: Per Dr. Janese Banks pt to receive 1 Liter NS with 20 mEqs of IV potassium over one hour in addition to treatment. ** see MAR for order**

## 2019-10-30 ENCOUNTER — Other Ambulatory Visit: Payer: Self-pay

## 2019-10-30 ENCOUNTER — Inpatient Hospital Stay: Payer: BC Managed Care – PPO

## 2019-10-30 ENCOUNTER — Inpatient Hospital Stay (HOSPITAL_BASED_OUTPATIENT_CLINIC_OR_DEPARTMENT_OTHER): Payer: BC Managed Care – PPO | Admitting: Oncology

## 2019-10-30 ENCOUNTER — Encounter: Payer: Self-pay | Admitting: Oncology

## 2019-10-30 VITALS — BP 148/92 | HR 85 | Temp 99.0°F | Resp 18 | Wt 169.5 lb

## 2019-10-30 DIAGNOSIS — Z171 Estrogen receptor negative status [ER-]: Secondary | ICD-10-CM | POA: Diagnosis not present

## 2019-10-30 DIAGNOSIS — Z95828 Presence of other vascular implants and grafts: Secondary | ICD-10-CM

## 2019-10-30 DIAGNOSIS — C50312 Malignant neoplasm of lower-inner quadrant of left female breast: Secondary | ICD-10-CM

## 2019-10-30 DIAGNOSIS — T451X5A Adverse effect of antineoplastic and immunosuppressive drugs, initial encounter: Secondary | ICD-10-CM

## 2019-10-30 DIAGNOSIS — G620 Drug-induced polyneuropathy: Secondary | ICD-10-CM

## 2019-10-30 DIAGNOSIS — D6481 Anemia due to antineoplastic chemotherapy: Secondary | ICD-10-CM

## 2019-10-30 LAB — CBC WITH DIFFERENTIAL/PLATELET
Abs Immature Granulocytes: 0.07 10*3/uL (ref 0.00–0.07)
Basophils Absolute: 0 10*3/uL (ref 0.0–0.1)
Basophils Relative: 0 %
Eosinophils Absolute: 0 10*3/uL (ref 0.0–0.5)
Eosinophils Relative: 0 %
HCT: 26 % — ABNORMAL LOW (ref 36.0–46.0)
Hemoglobin: 8.4 g/dL — ABNORMAL LOW (ref 12.0–15.0)
Immature Granulocytes: 2 %
Lymphocytes Relative: 26 %
Lymphs Abs: 0.9 10*3/uL (ref 0.7–4.0)
MCH: 29.5 pg (ref 26.0–34.0)
MCHC: 32.3 g/dL (ref 30.0–36.0)
MCV: 91.2 fL (ref 80.0–100.0)
Monocytes Absolute: 0.7 10*3/uL (ref 0.1–1.0)
Monocytes Relative: 21 %
Neutro Abs: 1.7 10*3/uL (ref 1.7–7.7)
Neutrophils Relative %: 51 %
Platelets: 136 10*3/uL — ABNORMAL LOW (ref 150–400)
RBC: 2.85 MIL/uL — ABNORMAL LOW (ref 3.87–5.11)
RDW: 17.4 % — ABNORMAL HIGH (ref 11.5–15.5)
Smear Review: DECREASED
WBC: 3.3 10*3/uL — ABNORMAL LOW (ref 4.0–10.5)
nRBC: 1.2 % — ABNORMAL HIGH (ref 0.0–0.2)

## 2019-10-30 LAB — COMPREHENSIVE METABOLIC PANEL
ALT: 20 U/L (ref 0–44)
AST: 17 U/L (ref 15–41)
Albumin: 3.4 g/dL — ABNORMAL LOW (ref 3.5–5.0)
Alkaline Phosphatase: 75 U/L (ref 38–126)
Anion gap: 8 (ref 5–15)
BUN: 13 mg/dL (ref 8–23)
CO2: 25 mmol/L (ref 22–32)
Calcium: 8.9 mg/dL (ref 8.9–10.3)
Chloride: 103 mmol/L (ref 98–111)
Creatinine, Ser: 0.69 mg/dL (ref 0.44–1.00)
GFR calc Af Amer: >60 mL/min (ref >60)
GFR calc non Af Amer: >60 mL/min (ref >60)
Glucose, Bld: 96 mg/dL (ref 70–99)
Potassium: 3.6 mmol/L (ref 3.5–5.1)
Sodium: 136 mmol/L (ref 135–145)
Total Bilirubin: 0.6 mg/dL (ref 0.3–1.2)
Total Protein: 6.8 g/dL (ref 6.5–8.1)

## 2019-10-30 MED ORDER — HEPARIN SOD (PORK) LOCK FLUSH 100 UNIT/ML IV SOLN
INTRAVENOUS | Status: AC
  Filled 2019-10-30: qty 5

## 2019-10-30 MED ORDER — HEPARIN SOD (PORK) LOCK FLUSH 100 UNIT/ML IV SOLN
500.0000 [IU] | Freq: Once | INTRAVENOUS | Status: AC
  Administered 2019-10-30: 500 [IU] via INTRAVENOUS
  Filled 2019-10-30: qty 5

## 2019-10-30 MED ORDER — SODIUM CHLORIDE 0.9% FLUSH
10.0000 mL | INTRAVENOUS | Status: DC | PRN
  Administered 2019-10-30: 10 mL via INTRAVENOUS
  Filled 2019-10-30: qty 10

## 2019-10-30 NOTE — Progress Notes (Signed)
Hematology/Oncology Consult note [facility]  Telephone:([phone] [phone]  Patient Care Team: Lanny Hurst, MD as PCP - General (Internal Medicine) Rico Junker, RN as Registered Nurse Theodore Demark, RN as Registered Nurse Sindy Guadeloupe, MD as Consulting Physician (Oncology)   Name of the patient: Molly Walters  [id number]  [birth year]   Date of visit: 10/30/19  Diagnosis- clinical prognostic stage Ia invasive mammary carcinoma cmT1 ccN0 cM0 ER/PR negative and HER-2/neu negative   Chief complaint/ Reason for visit-routine follow-up for breast cancer  Heme/Onc history: patient is a 64 year old African-American female with past medical history significant for hypertension. She has had prior breast cancer in 2001 s/p lumpectomy followed by adjuvant radiation treatment. She did not require adjuvant chemotherapy. She does not remember taking any endocrine therapy as well.More recently patient underwent a bilateral screening mammogram in November 2020 which showed a possible mass in the left breast. This was followed by a diagnostic mammogram and ultrasound which showed a 1.6 cm mass at the 7 o'clock position in the left breast near the lumpectomy scar. No left axillary adenopathy. 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   MRI bilateral breast showed:The size of the primary left breast mass appears larger on MRI close to 2.4 x 1.9 x 1.7 cm. She is also noted to have 2 additional enhancing masses 9 x 6 x 10 mm and 5 x 5 x 5 mm in the left breast with suspicious washout kinetics. No pathological left axillary adenopathy. Prominent lymph nodes seen on the right side along with non-mass enhancement spanning 5.3 cm in the AP dimension.Both the enhancing masses in the left breast were positive for triple negative breast cancer. Right sided axillary lymph node was negative  for malignancy.Right breast biopsy showed DCIS  Patient has completed neoadjuvant dose dense AC chemotherapy and 12 weekly cycles of carbotaxol chemotherapy on 10/16/2019.  Plan is for bilateral mastectomy without reconstruction   Interval history-feels fatigued.  She is also been dealing with poor vision because of her ongoing cataracts and will be undergoing evaluation for that next week as well.  Reports neuropathy is overall getting better when she has not started taking Cymbalta yet  ECOG PS- 1 Pain scale- 0  Review of systems- Review of Systems  Constitutional: Positive for malaise/fatigue. Negative for chills, fever and weight loss.  HENT: Negative for congestion, ear discharge and nosebleeds.   Eyes: Negative for blurred vision.  Respiratory: Negative for cough, hemoptysis, sputum production, shortness of breath and wheezing.   Cardiovascular: Negative for chest pain, palpitations, orthopnea and claudication.  Gastrointestinal: Negative for abdominal pain, blood in stool, constipation, diarrhea, heartburn, melena, nausea and vomiting.  Genitourinary: Negative for dysuria, flank pain, frequency, hematuria and urgency.  Musculoskeletal: Negative for back pain, joint pain and myalgias.  Skin: Negative for rash.  Neurological: Positive for sensory change (peripheral neuropathy). Negative for dizziness, tingling, focal weakness, seizures, weakness and headaches.  Endo/Heme/Allergies: Does not bruise/bleed easily.  Psychiatric/Behavioral: Negative for depression and suicidal ideas. The patient does not have insomnia.       No Known Allergies   Past Medical History:  Diagnosis Date  . Breast cancer ([place]) 2001   left breast lumpectomy and rad tx  . Breast cancer ([place]) 02/2019   left breast mass w/ biopsy  . Hypertension   . Personal history of radiation therapy 2001   BREAST CA  Past Surgical History:  Procedure Laterality Date  . ABDOMINAL HYSTERECTOMY    . BREAST  BIOPSY Left 2001   POS  . BREAST BIOPSY Left 2021   positive  . BREAST LUMPECTOMY Left 2001  . PORTACATH PLACEMENT Right 05/16/2019   Procedure: INSERTION PORT-A-CATH;  Surgeon: Jules Husbands, MD;  Location: [facility] ORS;  Service: General;  Laterality: Right;    Social History   Socioeconomic History  . Marital status: Divorced    Spouse name: Not on file  . Number of children: Not on file  . Years of education: Not on file  . Highest education level: Not on file  Occupational History  . Not on file  Tobacco Use  . Smoking status: Former Smoker    Packs/day: 0.50    Years: 5.00    Pack years: 2.50    Types: Cigarettes    Quit date: 1970    Years since quitting: 51.5  . Smokeless tobacco: Never Used  Vaping Use  . Vaping Use: Never used  Substance and Sexual Activity  . Alcohol use: Never  . Drug use: Never  . Sexual activity: Not Currently  Other Topics Concern  . Not on file  Social History Narrative  . Not on file   Social Determinants of Health   Financial Resource Strain:   . Difficulty of Paying Living Expenses:   Food Insecurity:   . Worried About Charity fundraiser in the Last Year:   . Arboriculturist in the Last Year:   Transportation Needs:   . Film/video editor (Medical):   Marland Kitchen Lack of Transportation (Non-Medical):   Physical Activity:   . Days of Exercise per Week:   . Minutes of Exercise per Session:   Stress:   . Feeling of Stress :   Social Connections:   . Frequency of Communication with Friends and Family:   . Frequency of Social Gatherings with Friends and Family:   . Attends Religious Services:   . Active Member of Clubs or Organizations:   . Attends Archivist Meetings:   Marland Kitchen Marital Status:   Intimate Partner Violence:   . Fear of Current or Ex-Partner:   . Emotionally Abused:   Marland Kitchen Physically Abused:   . Sexually Abused:     Family History  Problem Relation Age of Onset  . Hypertension Mother   . Prostate cancer  Paternal Uncle   . Breast cancer Neg Hx      Current Outpatient Medications:  .  acetaminophen (TYLENOL) 500 MG tablet, Take 500 mg by mouth every 6 (six) hours as needed., Disp: , Rfl:  .  cholecalciferol (VITAMIN D3) 25 MCG (1000 UNIT) tablet, Take 1,000 Units by mouth daily., Disp: , Rfl:  .  dexamethasone (DECADRON) 4 MG tablet, TAKE 2 TABLETS BY MOUTH ONCE A DAY ON THE DAY AFTER CHEMOTHERAPY AND THEN 2 TABLETS TWICE DAILY FOR 2 DAYS. TAKE WITH FOOD., Disp: 30 tablet, Rfl: 11 .  DULoxetine (CYMBALTA) 30 MG capsule, Take 1 capsule (30 mg total) by mouth daily. Increase to 60 mg daily after 1 week, Disp: 60 capsule, Rfl: 3 .  hydrochlorothiazide (HYDRODIURIL) 25 MG tablet, Take 25 mg by mouth daily., Disp: , Rfl:  .  latanoprost (XALATAN) 0.005 % ophthalmic solution, Place 1 drop into both eyes at bedtime. , Disp: , Rfl:  .  lidocaine-prilocaine (EMLA) cream, Apply to affected area once, Disp: 30 g, Rfl: 3 .  loperamide (IMODIUM A-D) 2 MG  tablet, Take 2 mg by mouth 4 (four) times daily as needed for diarrhea or loose stools., Disp: , Rfl:  .  LORazepam (ATIVAN) 0.5 MG tablet, Take 1 tablet (0.5 mg total) by mouth every 6 (six) hours as needed (Nausea or vomiting)., Disp: 30 tablet, Rfl: 0 .  losartan (COZAAR) 100 MG tablet, Take 100 mg by mouth daily., Disp: , Rfl:  .  ondansetron (ZOFRAN) 8 MG tablet, Take 1 tablet (8 mg total) by mouth 2 (two) times daily as needed. Start on the third day after chemotherapy., Disp: 30 tablet, Rfl: 1 .  pantoprazole (PROTONIX) 20 MG tablet, TAKE 1 TABLET BY MOUTH ONCE DAILY. (Patient taking differently: Take 20 mg by mouth daily. ), Disp: 30 tablet, Rfl: 11 .  potassium chloride SA (KLOR-CON) 20 MEQ tablet, Take 1 tablet (20 mEq total) by mouth 3 (three) times daily., Disp: 90 tablet, Rfl: 3 .  prochlorperazine (COMPAZINE) 10 MG tablet, Take 1 tablet (10 mg total) by mouth every 6 (six) hours as needed (Nausea or vomiting)., Disp: 30 tablet, Rfl: 1 .  timolol  (TIMOPTIC) 0.5 % ophthalmic solution, Place 1 drop into both eyes daily. , Disp: , Rfl:  No current facility-administered medications for this visit.  Facility-Administered Medications Ordered in Other Visits:  .  sodium chloride flush (NS) 0.9 % injection 10 mL, 10 mL, Intravenous, PRN, Sindy Guadeloupe, MD, 10 mL at 10/09/19 0815  Physical exam:  Vitals:   10/30/19 0913  BP: (!) 148/92  Pulse: 85  Resp: 18  Temp: 99 F (37.2 C)  TempSrc: Tympanic  SpO2: 100%  Weight: 169 lb 8 oz (76.9 kg)   Physical Exam HENT:     Head: Normocephalic and atraumatic.  Eyes:     Pupils: Pupils are equal, round, and reactive to light.  Cardiovascular:     Rate and Rhythm: Normal rate and regular rhythm.     Heart sounds: Normal heart sounds.  Pulmonary:     Effort: Pulmonary effort is normal.     Breath sounds: Normal breath sounds.  Abdominal:     General: Bowel sounds are normal.     Palpations: Abdomen is soft.  Musculoskeletal:     Cervical back: Normal range of motion.  Skin:    General: Skin is warm and dry.  Neurological:     Mental Status: She is alert and oriented to person, place, and time.      CMP Latest Ref Rng & Units 10/30/2019  Glucose 70 - 99 mg/dL 96  BUN 8 - 23 mg/dL 13  Creatinine 0.44 - 1.00 mg/dL 0.69  Sodium 135 - 145 mmol/L 136  Potassium 3.5 - 5.1 mmol/L 3.6  Chloride 98 - 111 mmol/L 103  CO2 22 - 32 mmol/L 25  Calcium 8.9 - 10.3 mg/dL 8.9  Total Protein 6.5 - 8.1 g/dL 6.8  Total Bilirubin 0.3 - 1.2 mg/dL 0.6  Alkaline Phos 38 - 126 U/L 75  AST 15 - 41 U/L 17  ALT 0 - 44 U/L 20   CBC Latest Ref Rng & Units 10/30/2019  WBC 4.0 - 10.5 K/uL 3.3(L)  Hemoglobin 12.0 - 15.0 g/dL 8.4(L)  Hematocrit 36 - 46 % 26.0(L)  Platelets 150 - 400 K/uL 136(L)     Assessment and plan- Patient is a 64 y.o. female clinical prognostic stage Ia invasive mammary carcinoma mcT1 ccN0 cM0 ER/PR negative and HER-2/neu negative.  She is s/p neoadjuvant dose dense AC chemotherapy  followed by 12 weekly cycles  of carbotaxol chemotherapy.  Patient has completed neoadjuvant chemotherapy and will be proceeding for bilateral mastectomy without reconstruction on 11/11/2019.  I will see her back on 12/01/19 to discuss final pathology results and further management.  Port can be removed at the time of surgery  Although there is no significant family history of breast cancer and her age of diagnosis of triple negative breast cancer was more than 42, I will plan to send BRCA testing as the recent [place] trial showed improvement in disease-free survival with collaborative in patients with BRCA1 or 2 pathogenic mutations in the adjuvant setting.  We will draw this with her next set of labs  Chemo-induced anemia: Will likely take several weeks to improve.  She can proceed with surgery from an anemia standpoint at this time  Chemo-induced peripheral neuropathy: Currently stable and patient is not taking Cymbalta  Hypokalemia: Currently resolved.  Patient will continue taking her potassium supplements however     Visit Diagnosis 1. Malignant neoplasm of lower-inner quadrant of left breast in female, estrogen receptor negative ([place])   2. Antineoplastic chemotherapy induced anemia   3. Chemotherapy-induced peripheral neuropathy ([place])      Dr. Randa Evens, MD, MPH [facility] [phone] 10/30/2019 8:42 AM

## 2019-10-31 ENCOUNTER — Other Ambulatory Visit: Payer: Self-pay

## 2019-10-31 MED ORDER — POTASSIUM CHLORIDE CRYS ER 20 MEQ PO TBCR: 20.0000 meq | EXTENDED_RELEASE_TABLET | Freq: Three times a day (TID) | ORAL | 3 refills | Status: DC

## 2019-11-03 ENCOUNTER — Other Ambulatory Visit: Payer: Self-pay

## 2019-11-03 ENCOUNTER — Encounter
Admission: RE | Admit: 2019-11-03 | Discharge: 2019-11-03 | Disposition: A | Discharge status: Home / Self Care | Payer: BC Managed Care – PPO | Source: Ambulatory Visit | Attending: Surgery | Admitting: Surgery

## 2019-11-03 HISTORY — DX: Gastro-esophageal reflux disease without esophagitis: K21.9

## 2019-11-03 NOTE — Patient Instructions (Signed)
Your procedure is scheduled on: 11/11/19 Report to [facility]. To find out your arrival time please call [phone] between 1PM - 3PM on 11/10/19.  Remember: Instructions that are not followed completely may result in serious medical risk, up to and including death, or upon the discretion of your surgeon and anesthesiologist your surgery may need to be rescheduled.     _X__ 1. Do not eat food after midnight the night before your procedure.                 No gum chewing or hard candies. You may drink clear liquids up to 2 hours                 before you are scheduled to arrive for your surgery- DO not drink clear                 liquids within 2 hours of the start of your surgery.                 Clear Liquids include:  water, apple juice without pulp, clear carbohydrate                 drink such as Clearfast or Gatorade, Black Coffee or Tea (Do not add                 anything to coffee or tea). Diabetics water only  __X__2.  On the morning of surgery brush your teeth with toothpaste and water, you                 may rinse your mouth with mouthwash if you wish.  Do not swallow any              toothpaste of mouthwash.     _X__ 3.  No Alcohol for 24 hours before or after surgery.   _X__ 4.  Do Not Smoke or use e-cigarettes For 24 Hours Prior to Your Surgery.                 Do not use any chewable tobacco products for at least 6 hours prior to                 surgery.  ____  5.  Bring all medications with you on the day of surgery if instructed.   __X__  6.  Notify your doctor if there is any change in your medical condition      (cold, fever, infections).     Do not wear jewelry, make-up, hairpins, clips or nail polish. Do not wear lotions, powders, or perfumes.  Do not shave 48 hours prior to surgery. Men may shave face and neck. Do not bring valuables to the hospital.    [facility] is not responsible for any belongings or  valuables.  Contacts, dentures/partials or body piercings may not be worn into surgery. Bring a case for your contacts, glasses or hearing aids, a denture cup will be supplied. Leave your suitcase in the car. After surgery it may be brought to your room. For patients admitted to the hospital, discharge time is determined by your treatment team.   Patients discharged the day of surgery will not be allowed to drive home.   Please read over the following fact sheets that you were given:   MRSA Information  __X__ Take these medicines the morning of surgery with A SIP OF WATER:  1. DULoxetine (CYMBALTA) 30 MG capsule  2. pantoprazole (PROTONIX) 20 MG tablet  3.   4.  5.  6.  ____ Fleet Enema (as directed)   __X__ Use CHG Soap/SAGE wipes as directed  ____ Use inhalers on the day of surgery  ____ Stop metformin/Janumet/Farxiga 2 days prior to surgery    ____ Take 1/2 of usual insulin dose the night before surgery. No insulin the morning          of surgery.   ____ Stop Blood Thinners Coumadin/Plavix/Xarelto/Pleta/Pradaxa/Eliquis/Effient/Aspirin  on   Or contact your Surgeon, Cardiologist or Medical Doctor regarding  ability to stop your blood thinners  __X__ Stop Anti-inflammatories 7 days before surgery such as Advil, Ibuprofen, Motrin,  BC or Goodies Powder, Naprosyn, Naproxen, Aleve, Aspirin    __X__ Stop all herbal supplements, fish oil or vitamin E until after surgery.    ____ Bring C-Pap to the hospital.

## 2019-11-04 ENCOUNTER — Other Ambulatory Visit: Admission: RE | Admit: 2019-11-04 | Payer: BC Managed Care – PPO | Source: Ambulatory Visit

## 2019-11-05 ENCOUNTER — Encounter
Admission: RE | Admit: 2019-11-05 | Discharge: 2019-11-05 | Disposition: A | Discharge status: Home / Self Care | Payer: BC Managed Care – PPO | Source: Ambulatory Visit | Attending: Surgery | Admitting: Surgery

## 2019-11-05 ENCOUNTER — Other Ambulatory Visit: Payer: Self-pay

## 2019-11-05 DIAGNOSIS — I10 Essential (primary) hypertension: Secondary | ICD-10-CM | POA: Diagnosis not present

## 2019-11-05 DIAGNOSIS — Z01810 Encounter for preprocedural cardiovascular examination: Secondary | ICD-10-CM | POA: Diagnosis present

## 2019-11-05 DIAGNOSIS — Z01812 Encounter for preprocedural laboratory examination: Secondary | ICD-10-CM | POA: Insufficient documentation

## 2019-11-05 DIAGNOSIS — Z9221 Personal history of antineoplastic chemotherapy: Secondary | ICD-10-CM | POA: Diagnosis not present

## 2019-11-05 LAB — CBC
HCT: 28 % — ABNORMAL LOW (ref 36.0–46.0)
Hemoglobin: 8.9 g/dL — ABNORMAL LOW (ref 12.0–15.0)
MCH: 29.5 pg (ref 26.0–34.0)
MCHC: 31.8 g/dL (ref 30.0–36.0)
MCV: 92.7 fL (ref 80.0–100.0)
Platelets: 187 10*3/uL (ref 150–400)
RBC: 3.02 MIL/uL — ABNORMAL LOW (ref 3.87–5.11)
RDW: 17.5 % — ABNORMAL HIGH (ref 11.5–15.5)
WBC: 5 10*3/uL (ref 4.0–10.5)
nRBC: 0.4 % — ABNORMAL HIGH (ref 0.0–0.2)

## 2019-11-05 NOTE — Progress Notes (Signed)
  [facility] Perioperative Services: Pre-Admission/Anesthesia Testing  Abnormal Lab Notification    Date: 11/05/19  Name: Molly Walters MRN:   [id number]  Re: Abnormal labs noted during PAT appointment   Provider Notified: Jules Husbands, MD Notification mode: Routed via [facility]   ABNORMAL LAB VALUE(S): Lab Results  Component Value Date   HGB 8.9 (L) 11/05/2019   HCT 28.0 (L) 11/05/2019   Notes: Patient has recently completed neoadjuvant chemotherapy course (dose dense AC + 12 weekly cycles of carboplatin + paclitaxel) on 10/17/2019. She was seen in follow up by oncology on 10/30/2019. Patient for planned mastectomy without reconstruction scheduled on 11/11/2019. Oncology aware of blood counts and noted that it is directly related to recent chemotherapy and that counts will "take weeks" to recover. Dr. Janese Banks (oncology) states, "may proceed with surgery from an anemia standpoint at this time". As a precautionary measure, I will add a type and screen to be drawn on the morning of surgery.   Molly Loh, MSN, APRN, FNP-C, CEN [facility]  Peri-operative Services Nurse Practitioner Phone: [phone] 11/05/19 9:13 AM

## 2019-11-07 ENCOUNTER — Other Ambulatory Visit
Admission: RE | Admit: 2019-11-07 | Discharge: 2019-11-07 | Disposition: A | Discharge status: Home / Self Care | Payer: BC Managed Care – PPO | Source: Ambulatory Visit | Attending: Surgery | Admitting: Surgery

## 2019-11-07 ENCOUNTER — Other Ambulatory Visit: Payer: Self-pay

## 2019-11-07 DIAGNOSIS — Z20822 Contact with and (suspected) exposure to covid-19: Secondary | ICD-10-CM | POA: Insufficient documentation

## 2019-11-07 DIAGNOSIS — Z01812 Encounter for preprocedural laboratory examination: Secondary | ICD-10-CM | POA: Insufficient documentation

## 2019-11-07 LAB — TYPE AND SCREEN
ABO/RH(D): B POS
Antibody Screen: NEGATIVE

## 2019-11-07 LAB — SARS CORONAVIRUS 2 (TAT 6-24 HRS): SARS Coronavirus 2: NEGATIVE

## 2019-11-11 ENCOUNTER — Other Ambulatory Visit: Payer: Self-pay

## 2019-11-11 ENCOUNTER — Ambulatory Visit
Admission: RE | Admit: 2019-11-11 | Discharge: 2019-11-11 | Disposition: A | Discharge status: Home / Self Care | Payer: BC Managed Care – PPO | Source: Ambulatory Visit | Attending: Surgery | Admitting: Surgery

## 2019-11-11 ENCOUNTER — Observation Stay
Admission: RE | Admit: 2019-11-11 | Discharge: 2019-11-12 | Disposition: A | Discharge status: Home / Self Care | Payer: BC Managed Care – PPO | Attending: Surgery | Admitting: Surgery

## 2019-11-11 ENCOUNTER — Encounter: Payer: Self-pay | Admitting: Surgery

## 2019-11-11 ENCOUNTER — Inpatient Hospital Stay: Payer: BC Managed Care – PPO | Admitting: Urgent Care

## 2019-11-11 ENCOUNTER — Encounter
Admission: RE | Disposition: A | Discharge status: Home / Self Care | Payer: Self-pay | Source: Home / Self Care | Attending: Surgery

## 2019-11-11 DIAGNOSIS — C50919 Malignant neoplasm of unspecified site of unspecified female breast: Secondary | ICD-10-CM

## 2019-11-11 DIAGNOSIS — D0511 Intraductal carcinoma in situ of right breast: Secondary | ICD-10-CM | POA: Diagnosis not present

## 2019-11-11 DIAGNOSIS — C50911 Malignant neoplasm of unspecified site of right female breast: Secondary | ICD-10-CM

## 2019-11-11 DIAGNOSIS — Z87891 Personal history of nicotine dependence: Secondary | ICD-10-CM | POA: Insufficient documentation

## 2019-11-11 DIAGNOSIS — I10 Essential (primary) hypertension: Secondary | ICD-10-CM | POA: Insufficient documentation

## 2019-11-11 DIAGNOSIS — C50912 Malignant neoplasm of unspecified site of left female breast: Secondary | ICD-10-CM | POA: Diagnosis not present

## 2019-11-11 DIAGNOSIS — C50312 Malignant neoplasm of lower-inner quadrant of left female breast: Secondary | ICD-10-CM | POA: Insufficient documentation

## 2019-11-11 HISTORY — PX: PORT-A-CATH REMOVAL: SHX5289

## 2019-11-11 HISTORY — PX: SIMPLE MASTECTOMY WITH AXILLARY SENTINEL NODE BIOPSY: SHX6098

## 2019-11-11 LAB — CBC
HCT: 26.1 % — ABNORMAL LOW (ref 36.0–46.0)
Hemoglobin: 8.4 g/dL — ABNORMAL LOW (ref 12.0–15.0)
MCH: 29.3 pg (ref 26.0–34.0)
MCHC: 32.2 g/dL (ref 30.0–36.0)
MCV: 90.9 fL (ref 80.0–100.0)
Platelets: 248 10*3/uL (ref 150–400)
RBC: 2.87 MIL/uL — ABNORMAL LOW (ref 3.87–5.11)
RDW: 17.5 % — ABNORMAL HIGH (ref 11.5–15.5)
WBC: 5.3 10*3/uL (ref 4.0–10.5)
nRBC: 1 % — ABNORMAL HIGH (ref 0.0–0.2)

## 2019-11-11 LAB — CREATININE, SERUM
Creatinine, Ser: 0.95 mg/dL (ref 0.44–1.00)
GFR calc Af Amer: >60 mL/min (ref >60)
GFR calc non Af Amer: >60 mL/min (ref >60)

## 2019-11-11 LAB — ABO/RH: ABO/RH(D): B POS

## 2019-11-11 LAB — HIV ANTIBODY (ROUTINE TESTING W REFLEX): HIV Screen 4th Generation wRfx: NONREACTIVE

## 2019-11-11 SURGERY — SIMPLE MASTECTOMY WITH AXILLARY SENTINEL NODE BIOPSY
Anesthesia: General

## 2019-11-11 MED ORDER — KETAMINE HCL 10 MG/ML IJ SOLN
INTRAMUSCULAR | Status: DC | PRN
Start: 2019-11-11 — End: 2019-11-11
  Administered 2019-11-11: 30 mg via INTRAVENOUS

## 2019-11-11 MED ORDER — PROPOFOL 10 MG/ML IV BOLUS
INTRAVENOUS | Status: DC | PRN
  Administered 2019-11-11: 130 mg via INTRAVENOUS

## 2019-11-11 MED ORDER — LOSARTAN POTASSIUM 50 MG PO TABS
100.0000 mg | ORAL_TABLET | Freq: Every day | ORAL | Status: DC
  Administered 2019-11-11 – 2019-11-12 (×2): 100 mg via ORAL
  Filled 2019-11-11 (×2): qty 2

## 2019-11-11 MED ORDER — TECHNETIUM TC 99M SULFUR COLLOID FILTERED
1.0000 | Freq: Once | INTRAVENOUS | Status: AC | PRN
  Administered 2019-11-11: 0.922 via INTRADERMAL

## 2019-11-11 MED ORDER — ONDANSETRON HCL 4 MG/2ML IJ SOLN: 4.0000 mg | Freq: Four times a day (QID) | INTRAMUSCULAR | Status: DC | PRN

## 2019-11-11 MED ORDER — PROCHLORPERAZINE MALEATE 10 MG PO TABS
10.0000 mg | ORAL_TABLET | Freq: Four times a day (QID) | ORAL | Status: DC | PRN
  Filled 2019-11-11: qty 1

## 2019-11-11 MED ORDER — CHLORHEXIDINE GLUCONATE CLOTH 2 % EX PADS: 6.0000 | MEDICATED_PAD | Freq: Once | CUTANEOUS | Status: DC

## 2019-11-11 MED ORDER — ORAL CARE MOUTH RINSE: 15.0000 mL | Freq: Once | OROMUCOSAL | Status: AC

## 2019-11-11 MED ORDER — MIDAZOLAM HCL 2 MG/2ML IJ SOLN
INTRAMUSCULAR | Status: AC
  Filled 2019-11-11: qty 2

## 2019-11-11 MED ORDER — METOPROLOL TARTRATE 5 MG/5ML IV SOLN: 5.0000 mg | Freq: Four times a day (QID) | INTRAVENOUS | Status: DC | PRN

## 2019-11-11 MED ORDER — BUPIVACAINE LIPOSOME 1.3 % IJ SUSP
INTRAMUSCULAR | Status: DC | PRN
  Administered 2019-11-11: 20 mL

## 2019-11-11 MED ORDER — KETAMINE HCL 50 MG/ML IJ SOLN
INTRAMUSCULAR | Status: AC
  Filled 2019-11-11: qty 10

## 2019-11-11 MED ORDER — SUGAMMADEX SODIUM 200 MG/2ML IV SOLN
INTRAVENOUS | Status: DC | PRN
  Administered 2019-11-11: 200 mg via INTRAVENOUS

## 2019-11-11 MED ORDER — OXYCODONE HCL 5 MG PO TABS: 5.0000 mg | ORAL_TABLET | ORAL | Status: DC | PRN

## 2019-11-11 MED ORDER — BUPIVACAINE-EPINEPHRINE 0.25% -1:200000 IJ SOLN
INTRAMUSCULAR | Status: DC | PRN
  Administered 2019-11-11: 30 mL

## 2019-11-11 MED ORDER — CEFAZOLIN SODIUM-DEXTROSE 2-4 GM/100ML-% IV SOLN
2.0000 g | INTRAVENOUS | Status: AC
  Administered 2019-11-11: 2 g via INTRAVENOUS

## 2019-11-11 MED ORDER — TIMOLOL MALEATE 0.5 % OP SOLN
1.0000 [drp] | Freq: Every day | OPHTHALMIC | Status: DC
  Administered 2019-11-12: 1 [drp] via OPHTHALMIC
  Filled 2019-11-11: qty 5

## 2019-11-11 MED ORDER — TECHNETIUM TC 99M SULFUR COLLOID FILTERED
1.0000 | Freq: Once | INTRAVENOUS | Status: AC | PRN
  Administered 2019-11-11: 0.796 via INTRADERMAL

## 2019-11-11 MED ORDER — CHLORHEXIDINE GLUCONATE 0.12 % MT SOLN: 15.0000 mL | Freq: Once | OROMUCOSAL | Status: AC

## 2019-11-11 MED ORDER — LORAZEPAM 0.5 MG PO TABS: 0.5000 mg | ORAL_TABLET | Freq: Four times a day (QID) | ORAL | Status: DC | PRN

## 2019-11-11 MED ORDER — ISOSULFAN BLUE 1 % ~~LOC~~ SOLN
SUBCUTANEOUS | Status: DC | PRN
  Administered 2019-11-11: 5 mL via SUBCUTANEOUS

## 2019-11-11 MED ORDER — FENTANYL CITRATE (PF) 100 MCG/2ML IJ SOLN
25.0000 ug | INTRAMUSCULAR | Status: DC | PRN
  Administered 2019-11-11: 25 ug via INTRAVENOUS

## 2019-11-11 MED ORDER — CHLORHEXIDINE GLUCONATE 0.12 % MT SOLN
OROMUCOSAL | Status: AC
  Administered 2019-11-11: 15 mL via OROMUCOSAL
  Filled 2019-11-11: qty 15

## 2019-11-11 MED ORDER — MORPHINE SULFATE (PF) 2 MG/ML IV SOLN: 2.0000 mg | INTRAVENOUS | Status: DC | PRN

## 2019-11-11 MED ORDER — LATANOPROST 0.005 % OP SOLN
1.0000 [drp] | Freq: Every day | OPHTHALMIC | Status: DC
  Administered 2019-11-11: 1 [drp] via OPHTHALMIC
  Filled 2019-11-11: qty 2.5

## 2019-11-11 MED ORDER — PHENYLEPHRINE HCL-NACL 10-0.9 MG/250ML-% IV SOLN
INTRAVENOUS | Status: DC | PRN
Start: 2019-11-11 — End: 2019-11-11
  Administered 2019-11-11: 50 ug/min via INTRAVENOUS

## 2019-11-11 MED ORDER — OXYCODONE HCL 5 MG/5ML PO SOLN: 5.0000 mg | Freq: Once | ORAL | Status: DC | PRN

## 2019-11-11 MED ORDER — ONDANSETRON HCL 4 MG/2ML IJ SOLN
INTRAMUSCULAR | Status: AC
  Filled 2019-11-11: qty 2

## 2019-11-11 MED ORDER — DEXAMETHASONE SODIUM PHOSPHATE 10 MG/ML IJ SOLN
INTRAMUSCULAR | Status: DC | PRN
  Administered 2019-11-11: 8 mg via INTRAVENOUS

## 2019-11-11 MED ORDER — KETOROLAC TROMETHAMINE 15 MG/ML IJ SOLN
INTRAMUSCULAR | Status: AC
  Administered 2019-11-11: 15 mg via INTRAVENOUS
  Filled 2019-11-11: qty 1

## 2019-11-11 MED ORDER — DULOXETINE HCL 30 MG PO CPEP
30.0000 mg | ORAL_CAPSULE | Freq: Every day | ORAL | Status: DC
  Administered 2019-11-11 – 2019-11-12 (×2): 30 mg via ORAL
  Filled 2019-11-11 (×2): qty 1

## 2019-11-11 MED ORDER — GLYCOPYRROLATE 0.2 MG/ML IJ SOLN
INTRAMUSCULAR | Status: DC | PRN
  Administered 2019-11-11: .2 mg via INTRAVENOUS

## 2019-11-11 MED ORDER — CEFAZOLIN SODIUM-DEXTROSE 2-4 GM/100ML-% IV SOLN
INTRAVENOUS | Status: AC
  Filled 2019-11-11: qty 100

## 2019-11-11 MED ORDER — GABAPENTIN 300 MG PO CAPS: 300.0000 mg | ORAL_CAPSULE | ORAL | Status: AC

## 2019-11-11 MED ORDER — FENTANYL CITRATE (PF) 100 MCG/2ML IJ SOLN
INTRAMUSCULAR | Status: AC
  Administered 2019-11-11: 25 ug via INTRAVENOUS
  Filled 2019-11-11: qty 2

## 2019-11-11 MED ORDER — FENTANYL CITRATE (PF) 100 MCG/2ML IJ SOLN
INTRAMUSCULAR | Status: DC | PRN
  Administered 2019-11-11 (×3): 50 ug via INTRAVENOUS

## 2019-11-11 MED ORDER — ACETAMINOPHEN 500 MG PO TABS: 1000.0000 mg | ORAL_TABLET | ORAL | Status: AC

## 2019-11-11 MED ORDER — CELECOXIB 200 MG PO CAPS: 200.0000 mg | ORAL_CAPSULE | ORAL | Status: AC

## 2019-11-11 MED ORDER — ROCURONIUM BROMIDE 10 MG/ML (PF) SYRINGE
PREFILLED_SYRINGE | INTRAVENOUS | Status: AC
  Filled 2019-11-11: qty 10

## 2019-11-11 MED ORDER — SODIUM CHLORIDE 0.9 % IV SOLN: INTRAVENOUS | Status: DC

## 2019-11-11 MED ORDER — OXYCODONE HCL 5 MG PO TABS: 5.0000 mg | ORAL_TABLET | Freq: Once | ORAL | Status: DC | PRN

## 2019-11-11 MED ORDER — ENOXAPARIN SODIUM 40 MG/0.4ML ~~LOC~~ SOLN
40.0000 mg | SUBCUTANEOUS | Status: DC
  Administered 2019-11-12: 40 mg via SUBCUTANEOUS
  Filled 2019-11-11: qty 0.4

## 2019-11-11 MED ORDER — MIDAZOLAM HCL 2 MG/2ML IJ SOLN
INTRAMUSCULAR | Status: DC | PRN
  Administered 2019-11-11: 1 mg via INTRAVENOUS

## 2019-11-11 MED ORDER — ONDANSETRON HCL 4 MG/2ML IJ SOLN
INTRAMUSCULAR | Status: DC | PRN
  Administered 2019-11-11: 4 mg via INTRAVENOUS

## 2019-11-11 MED ORDER — ONDANSETRON 4 MG PO TBDP: 4.0000 mg | ORAL_TABLET | Freq: Four times a day (QID) | ORAL | Status: DC | PRN

## 2019-11-11 MED ORDER — ROCURONIUM BROMIDE 100 MG/10ML IV SOLN
INTRAVENOUS | Status: DC | PRN
  Administered 2019-11-11: 50 mg via INTRAVENOUS
  Administered 2019-11-11: 10 mg via INTRAVENOUS
  Administered 2019-11-11: 20 mg via INTRAVENOUS

## 2019-11-11 MED ORDER — PROCHLORPERAZINE EDISYLATE 10 MG/2ML IJ SOLN: 5.0000 mg | Freq: Four times a day (QID) | INTRAMUSCULAR | Status: DC | PRN

## 2019-11-11 MED ORDER — ACETAMINOPHEN 500 MG PO TABS
ORAL_TABLET | ORAL | Status: AC
  Administered 2019-11-11: 1000 mg via ORAL
  Filled 2019-11-11: qty 2

## 2019-11-11 MED ORDER — DEXAMETHASONE SODIUM PHOSPHATE 10 MG/ML IJ SOLN
INTRAMUSCULAR | Status: AC
  Filled 2019-11-11: qty 1

## 2019-11-11 MED ORDER — LIDOCAINE HCL (CARDIAC) PF 100 MG/5ML IV SOSY
PREFILLED_SYRINGE | INTRAVENOUS | Status: DC | PRN
  Administered 2019-11-11: 80 mg via INTRAVENOUS

## 2019-11-11 MED ORDER — LIDOCAINE HCL (PF) 2 % IJ SOLN
INTRAMUSCULAR | Status: AC
  Filled 2019-11-11: qty 5

## 2019-11-11 MED ORDER — LACTATED RINGERS IV SOLN: INTRAVENOUS | Status: DC

## 2019-11-11 MED ORDER — PANTOPRAZOLE SODIUM 20 MG PO TBEC
20.0000 mg | DELAYED_RELEASE_TABLET | Freq: Every day | ORAL | Status: DC
  Administered 2019-11-12: 20 mg via ORAL
  Filled 2019-11-11: qty 1

## 2019-11-11 MED ORDER — CELECOXIB 200 MG PO CAPS
ORAL_CAPSULE | ORAL | Status: AC
  Administered 2019-11-11: 200 mg via ORAL
  Filled 2019-11-11: qty 1

## 2019-11-11 MED ORDER — FENTANYL CITRATE (PF) 100 MCG/2ML IJ SOLN
INTRAMUSCULAR | Status: AC
  Filled 2019-11-11: qty 2

## 2019-11-11 MED ORDER — BUPIVACAINE LIPOSOME 1.3 % IJ SUSP
INTRAMUSCULAR | Status: AC
  Filled 2019-11-11: qty 20

## 2019-11-11 MED ORDER — KETOROLAC TROMETHAMINE 15 MG/ML IJ SOLN
15.0000 mg | Freq: Four times a day (QID) | INTRAMUSCULAR | Status: DC
  Administered 2019-11-11 – 2019-11-12 (×3): 15 mg via INTRAVENOUS
  Filled 2019-11-11 (×3): qty 1

## 2019-11-11 MED ORDER — PHENYLEPHRINE HCL (PRESSORS) 10 MG/ML IV SOLN
INTRAVENOUS | Status: DC | PRN
  Administered 2019-11-11: 100 ug via INTRAVENOUS

## 2019-11-11 MED ORDER — ACETAMINOPHEN 500 MG PO TABS
1000.0000 mg | ORAL_TABLET | Freq: Four times a day (QID) | ORAL | Status: DC
  Administered 2019-11-11 – 2019-11-12 (×3): 1000 mg via ORAL
  Filled 2019-11-11 (×3): qty 2

## 2019-11-11 MED ORDER — GABAPENTIN 300 MG PO CAPS
ORAL_CAPSULE | ORAL | Status: AC
  Administered 2019-11-11: 300 mg via ORAL
  Filled 2019-11-11: qty 1

## 2019-11-11 MED ORDER — LOPERAMIDE HCL 2 MG PO CAPS: 2.0000 mg | ORAL_CAPSULE | Freq: Four times a day (QID) | ORAL | Status: DC | PRN

## 2019-11-11 MED ORDER — PROPOFOL 10 MG/ML IV BOLUS
INTRAVENOUS | Status: AC
  Filled 2019-11-11: qty 60

## 2019-11-11 SURGICAL SUPPLY — 58 items
APPLIER CLIP 9.375 SM OPEN (CLIP) ×12
BAG BIOHAZARD 6X9 CLR ZIPLOCK (MISCELLANEOUS) ×4 IMPLANT
BINDER BREAST LRG (GAUZE/BANDAGES/DRESSINGS) ×4 IMPLANT
BINDER BREAST MEDIUM (GAUZE/BANDAGES/DRESSINGS) IMPLANT
BINDER BREAST XLRG (GAUZE/BANDAGES/DRESSINGS) IMPLANT
BLADE SURG 15 STRL LF DISP TIS (BLADE) ×2 IMPLANT
BLADE SURG 15 STRL SS (BLADE) ×2
BLADE SURG SZ11 CARB STEEL (BLADE) ×4 IMPLANT
BULB RESERV EVAC DRAIN JP 100C (MISCELLANEOUS) ×4 IMPLANT
CANISTER SUCT 1200ML W/VALVE (MISCELLANEOUS) ×4 IMPLANT
CHLORAPREP W/TINT 26 (MISCELLANEOUS) ×4 IMPLANT
CLIP APPLIE 9.375 SM OPEN (CLIP) ×6 IMPLANT
CNTNR SPEC 2.5X3XGRAD LEK (MISCELLANEOUS) ×8
CONT SPEC 4OZ STER OR WHT (MISCELLANEOUS) ×8
CONTAINER SPEC 2.5X3XGRAD LEK (MISCELLANEOUS) ×8 IMPLANT
COVER WAND RF STERILE (DRAPES) ×4 IMPLANT
DERMABOND ADVANCED (GAUZE/BANDAGES/DRESSINGS) ×6
DERMABOND ADVANCED .7 DNX12 (GAUZE/BANDAGES/DRESSINGS) ×6 IMPLANT
DRAIN CHANNEL JP 15F RND 16 (MISCELLANEOUS) IMPLANT
DRAIN CHANNEL JP 19F (MISCELLANEOUS) ×8 IMPLANT
DRAPE CHEST BREAST 15X10 FENES (DRAPES) ×4 IMPLANT
DRAPE INCISE IOBAN 66X45 STRL (DRAPES) ×4 IMPLANT
DRAPE LAPAROTOMY 77X122 PED (DRAPES) IMPLANT
DRAPE LAPAROTOMY TRNSV 106X77 (MISCELLANEOUS) IMPLANT
DRSG GAUZE FLUFF 36X18 (GAUZE/BANDAGES/DRESSINGS) ×8 IMPLANT
DRSG TEGADERM 4X4.75 (GAUZE/BANDAGES/DRESSINGS) ×4 IMPLANT
ELECT CAUTERY BLADE 6.4 (BLADE) IMPLANT
ELECT REM PT RETURN 9FT ADLT (ELECTROSURGICAL) ×4
ELECTRODE REM PT RTRN 9FT ADLT (ELECTROSURGICAL) ×2 IMPLANT
GLOVE BIO SURGEON STRL SZ7 (GLOVE) ×32 IMPLANT
GOWN STRL REUS W/ TWL LRG LVL3 (GOWN DISPOSABLE) ×4 IMPLANT
GOWN STRL REUS W/TWL LRG LVL3 (GOWN DISPOSABLE) ×4
KIT MARKER MARGIN INK (KITS) IMPLANT
KIT TURNOVER KIT A (KITS) ×4 IMPLANT
LABEL OR SOLS (LABEL) ×4 IMPLANT
NEEDLE FILTER BLUNT 18X 1/2SAF (NEEDLE) ×2
NEEDLE FILTER BLUNT 18X1 1/2 (NEEDLE) ×2 IMPLANT
NEEDLE HYPO 22GX1.5 SAFETY (NEEDLE) ×8 IMPLANT
NEEDLE HYPO 25X1 1.5 SAFETY (NEEDLE) ×4 IMPLANT
NS IRRIG 500ML POUR BTL (IV SOLUTION) ×4 IMPLANT
PACK BASIN MAJOR ARMC (MISCELLANEOUS) ×4 IMPLANT
PACK BASIN MINOR (MISCELLANEOUS) ×4 IMPLANT
SLEVE PROBE SENORX GAMMA FIND (MISCELLANEOUS) ×4 IMPLANT
SPONGE DRAIN TRACH 4X4 STRL 2S (GAUZE/BANDAGES/DRESSINGS) ×8 IMPLANT
SPONGE LAP 18X18 RF (DISPOSABLE) ×4 IMPLANT
SUT ETH BLK MONO 3 0 FS 1 12/B (SUTURE) IMPLANT
SUT ETHILON 3-0 FS-10 30 BLK (SUTURE) ×4
SUT MNCRL 4-0 (SUTURE) ×4
SUT MNCRL 4-0 27XMFL (SUTURE) ×4
SUT MNCRL AB 4-0 PS2 18 (SUTURE) ×4 IMPLANT
SUT SILK 2 0 SH (SUTURE) ×4 IMPLANT
SUT VIC AB 2-0 CT1 (SUTURE) ×24 IMPLANT
SUT VIC AB 3-0 SH 27 (SUTURE) ×2
SUT VIC AB 3-0 SH 27X BRD (SUTURE) ×2 IMPLANT
SUTURE EHLN 3-0 FS-10 30 BLK (SUTURE) ×2 IMPLANT
SUTURE MNCRL 4-0 27XMF (SUTURE) ×4 IMPLANT
SYR 10ML LL (SYRINGE) ×4 IMPLANT
SYR 20ML LL LF (SYRINGE) ×4 IMPLANT

## 2019-11-11 NOTE — Anesthesia Procedure Notes (Signed)
Procedure Name: Intubation Date/Time: 11/11/2019 11:06 AM Performed by: Lowry Bowl, CRNA Pre-anesthesia Checklist: Patient identified, Emergency Drugs available, Suction available and Patient being monitored Patient Re-evaluated:Patient Re-evaluated prior to induction Oxygen Delivery Method: Circle system utilized Preoxygenation: Pre-oxygenation with 100% oxygen Induction Type: IV induction Ventilation: Mask ventilation without difficulty Laryngoscope Size: Mac and 3 Grade View: Grade I Tube type: Oral Tube size: 7.0 mm Number of attempts: 1 Airway Equipment and Method: Stylet Placement Confirmation: ETT inserted through vocal cords under direct vision,  positive ETCO2 and breath sounds checked- equal and bilateral Secured at: 21 cm Tube secured with: Tape Dental Injury: Teeth and Oropharynx as per pre-operative assessment

## 2019-11-11 NOTE — Anesthesia Preprocedure Evaluation (Addendum)
Anesthesia Evaluation  Patient identified by MRN, date of birth, ID band Patient awake    Reviewed: Allergy & Precautions, H&P , NPO status , Patient's Chart, lab work & pertinent test results  Airway Mallampati: III  TM Distance: >3 FB Neck ROM: limited    Dental  (+) Chipped, Poor Dentition, Missing   Pulmonary neg shortness of breath, former smoker,    Pulmonary exam normal        Cardiovascular Exercise Tolerance: Good hypertension, (-) angina(-) Past MI and (-) DOE Normal cardiovascular exam     Neuro/Psych negative neurological ROS  negative psych ROS   GI/Hepatic Neg liver ROS, GERD  Medicated and Controlled,  Endo/Other  negative endocrine ROS  Renal/GU      Musculoskeletal   Abdominal   Peds  Hematology negative hematology ROS (+)   Anesthesia Other Findings Past Medical History: 2001: Breast cancer ([place])     Comment:  left breast lumpectomy and rad tx 02/2019: Breast cancer ([place])     Comment:  left breast mass w/ biopsy 2021: Breast cancer ([place])     Comment:  right DCIS No date: GERD (gastroesophageal reflux disease) No date: Hypertension 2001: Personal history of radiation therapy     Comment:  BREAST CA  Past Surgical History: No date: ABDOMINAL HYSTERECTOMY 2001: BREAST BIOPSY; Left     Comment:  POS 2021: BREAST BIOPSY; Left     Comment:  positive 2021: BREAST BIOPSY; Right     Comment:  DCIS 2001: BREAST LUMPECTOMY; Left 05/16/2019: PORTACATH PLACEMENT; Right     Comment:  Procedure: INSERTION PORT-A-CATH;  Surgeon: Jules Husbands, MD;  Location: [facility] ORS;  Service: General;                Laterality: Right;     Reproductive/Obstetrics negative OB ROS                           Anesthesia Physical Anesthesia Plan  ASA: II  Anesthesia Plan: General ETT   Post-op Pain Management:    Induction: Intravenous  PONV Risk Score and Plan:  Ondansetron, Dexamethasone, Midazolam and Treatment may vary due to age or medical condition  Airway Management Planned: Oral ETT  Additional Equipment:   Intra-op Plan:   Post-operative Plan: Extubation in OR  Informed Consent: I have reviewed the patients History and Physical, chart, labs and discussed the procedure including the risks, benefits and alternatives for the proposed anesthesia with the patient or authorized representative who has indicated his/her understanding and acceptance.     Dental Advisory Given  Plan Discussed with: Anesthesiologist, CRNA and Surgeon  Anesthesia Plan Comments: (Patient consented for risks of anesthesia including but not limited to:  - adverse reactions to medications - damage to eyes, teeth, lips or other oral mucosa - nerve damage due to positioning  - sore throat or hoarseness - Damage to heart, brain, nerves, lungs, other parts of body or loss of life  Patient voiced understanding.)      Anesthesia Quick Evaluation

## 2019-11-11 NOTE — Op Note (Signed)
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 Freda Jackson ( required for exposure, closure and due to the complexity of the case)  RNFA: Talmage Coin RNFA  Anesthesia: GETA  Procedure: Excision of Right Pot-a-cath Bilateral simple mastectomies, Bilateral sentinel node biopsies. Complex closure with tissue rearrangements measuring Chest wall 9x6 right side and 8x6 left side total 54+48= 102 cm2   Findings: Bilateral lymph nodes hot and blue   Estimated Blood Loss: 20cc         Drains: None         Specimens:, Sentinel nodes bilateral and bilateral breasts.        Complications: none             Condition: Stable    Procedure Details  The patient was seen again in the Holding Room. The benefits, complications, treatment options, and expected outcomes were discussed with the patient. The risks of bleeding, infection, recurrence of symptoms, failure to resolve symptoms, hematoma, seroma, open wound, cosmetic deformity, and the need for further surgery were discussed.  The patient was taken to Operating Room, identified  and the procedure verified.  A Time Out was held and the above information confirmed.  Prior to the induction of general anesthesia, antibiotic prophylaxis was administered. VTE prophylaxis was in place. Appropriate anesthesia was then administered and tolerated well. The chest was prepped with Chloraprep and draped in the sterile fashion. The patient was positioned in the supine position.  Isosulfan blue dye was injected periareolar early under aseptic conditions bilaterally.  Incision created on the right chest wall on top of the port electrocautery was used and we identified the pocket and identified the port.  Prolene sutures were cut and the catheter was removed in the standard fashion.  Pressure was placed in the  right internal jugular vein for 5 minutes.  We then closed the incision site with a 2 layer fashion with 3-0 Vicryl and 4-0 Monocryl.   Using the hand-held probe an area of high counts was identified in the  Right axilla, an incision was made and direction by the probe aided in dissection of a lymph node . I clipped the small lymphatic channels in the standard fashion. A total of three nodes that were both hot and blue were excised.   Attention was turned to right breast site where an incision was made in an elliptical shaped fashion. Flaps were created in the standard fashion , the mastectomy margins were inframammary fold caudally, clavicle cephalad, latissimus dorsi laterally and the sternum midline.  Performed standard mastectomy with electrocautery.. . Hemostasis was with electrocautery. Tissue advancement flaps were performed to decrease the volume deficit in the area of the resection.  Please note that the total void area was 9 x 6 cm on the right side, created a angel wing mastectomy incision to prevent any bulging on the axilla or lateral chest wall.   Attention was turned to Left Axilla Using the hand-held probe an area of high counts was identified, an incision was made and direction by the probe aided in dissection of a lymph node . I clipped the small lymphatic channels in the standard fashion. A total of one node that was both hot and blue was excised.  Attention was then turned to the left breast where an incision was made in an elliptical shaped fashion. Flaps  were created in the standard fashion , the mastectomy margins were inframammary fold caudally, clavicle cephalad, latissimus dorsi laterally and the sternum midline.  Performed standard mastectomy with electrocautery. Hemostasis was with electrocautery. Tissue advancement flaps were performed to decrease the volume deficit in the area of the resection.  Please note that the total void area was 8 x 6 cm on the left side, I created a  angel wing mastectomy incision to prevent any bulging on the axilla or lateral chest wall  Please note that the mastectomy sites were closed in a 2 layer fashion with 2-0 Vicryl for the dermis and 4-0 Monocryl in a subcuticular fashion for the skin.  Please also note that I injected Exparel and perform multiple bilateral intercostal nerve block in the standard fashion.  We also place 2 #19 Blake drains on top of the pectoralis muscle bilaterally.  Dermabond was placed top of all incisions. Fluffs and mastectomy bras placed.  Patient was taken to the recovery room in stable condition.   Caroleen Hamman , MD, FACS

## 2019-11-11 NOTE — Op Note (Deleted)
Robotic assisted Laparoscopic Transabdominal Left Inguinal Hernia Repair with 3 D large Mesh       Pre-operative Diagnosis:  Recurrent left Inguinal Hernia   Post-operative Diagnosis: Same   Procedure: Robotic  Laparoscopic  repair of left inguinal hernias   Surgeon: Caroleen Hamman, MD FACS   Anesthesia: Gen. with endotracheal tube   Findings: Left inguinal hernia indirect recurrent with significant inflammatory response due to prior repair.  Recurrence on the lateral side   Procedure Details  The patient was seen again in the Holding Room. The benefits, complications, treatment options, and expected outcomes were discussed with the patient. The risks of bleeding, infection, recurrence of symptoms, failure to resolve symptoms, recurrence of hernia, ischemic orchitis, chronic pain syndrome or neuroma, were discussed again. The likelihood of improving the patient's symptoms with return to their baseline status is good.  The patient and/or family concurred with the proposed plan, giving informed consent.  The patient was taken to Operating Room, identified  and the procedure verified as Laparoscopic Inguinal Hernia Repair. Laterality confirmed.  A Time Out was held and the above information confirmed.   Prior to the induction of general anesthesia, antibiotic prophylaxis was administered. VTE prophylaxis was in place. General endotracheal anesthesia was then administered and tolerated well. After the induction, the abdomen was prepped with Chloraprep and draped in the sterile fashion. The patient was positioned in the supine position.    Supraumbilical incision created and cut down technique used to enter the abdominal cavity. Fascia elevated and incised and hasson trochar placed. Pneumoperitoneum obtained w/o HD changes. No evidence of bowel injuries.  Two 8 mm placed under direct vision. The laparoscopy revealed large indirect defects. I inserted the needles and the mesh. The robot was brought  ot the table and docked in the standard fashion, no collision between arms was observed. Instruments were kept under direct view at all times. We started on the Left side where a flap was created. The sac was reduced and dissected free from adjacent structures.   The sigmoid was within the hernia sac, meticulous dissection allowed [place] to get the sigmoid off the peritoneum.  We preserved the vas and the vessels. Once dissection was completed a large 3D mesh was placed and secured with two interrupted vicryl attached to the pubic tubercle. There was good coverage of the direct, indirect and femoral spaces. The flap was closed with v lock suture. We encountered a few holes within the peritoneum that we were able to close with the redundant sac.  Second look revealed no complications or injuries.     Once assuring that hemostasis was adequate the ports were removed and a figure-of-eight 0 Vicryl suture was placed at the fascial edges. 4-0 subcuticular Monocryl was used at all skin edges. Dermabond was placed.  Patient tolerated the procedure well. There were no complications. He was taken to the recovery room in stable condition.                 Caroleen Hamman, MD, FACS

## 2019-11-11 NOTE — Transfer of Care (Signed)
Immediate Anesthesia Transfer of Care Note  Patient: Molly Walters  Procedure(s) Performed: SIMPLE MASTECTOMY WITH AXILLARY SENTINEL NODE BIOPSY (Bilateral ) REMOVAL PORT-A-CATH (N/A )  Patient Location: PACU  Anesthesia Type:General  Level of Consciousness: awake, drowsy and patient cooperative  Airway & Oxygen Therapy: Patient Spontanous Breathing and Patient connected to face mask oxygen  Post-op Assessment: Report given to RN and Post -op Vital signs reviewed and stable  Post vital signs: Reviewed and stable  Last Vitals:  Vitals Value Taken Time  BP 158/77 11/11/19 1430  Temp    Pulse 96 11/11/19 1431  Resp 9 11/11/19 1431  SpO2 98 % 11/11/19 1431  Vitals shown include unvalidated device data.  Last Pain:  Vitals:   11/11/19 0831  PainSc: 0-No pain         Complications: No complications documented.

## 2019-11-11 NOTE — Anesthesia Postprocedure Evaluation (Signed)
Anesthesia Post Note  Patient: Brittiny Pollman  Procedure(s) Performed: SIMPLE MASTECTOMY WITH AXILLARY SENTINEL NODE BIOPSY (Bilateral ) REMOVAL PORT-A-CATH (N/A )  Patient location during evaluation: PACU Anesthesia Type: General Level of consciousness: awake and alert and oriented Pain management: pain level controlled Vital Signs Assessment: post-procedure vital signs reviewed and stable Respiratory status: spontaneous breathing, nonlabored ventilation and respiratory function stable Cardiovascular status: blood pressure returned to baseline and stable Postop Assessment: no signs of nausea or vomiting Anesthetic complications: no   No complications documented.   Last Vitals:  Vitals:   11/11/19 1430 11/11/19 1445  BP: (!) 158/77 (!) 166/68  Pulse: 95 98  Resp: (!) 9 17  Temp: (!) 36.2 C   SpO2: 97% 99%    Last Pain:  Vitals:   11/11/19 1445  PainSc: 7                  Tajuanna Burnett

## 2019-11-11 NOTE — Interval H&P Note (Signed)
History and Physical Interval Note:  11/11/2019 10:35 AM  Molly Walters  has presented today for surgery, with the diagnosis of Bilateral breast cancer.  The various methods of treatment have been discussed with the patient and family. After consideration of risks, benefits and other options for treatment, the patient has consented to  Procedure(s): [place] (Bilateral) REMOVAL PORT-A-CATH (N/A) as a surgical intervention.  The patient's history has been reviewed, patient examined, no change in status, stable for surgery.  I have reviewed the patient's chart and labs.  Questions were answered to the patient's satisfaction.     [place]

## 2019-11-12 ENCOUNTER — Encounter: Payer: Self-pay | Admitting: Surgery

## 2019-11-12 LAB — BASIC METABOLIC PANEL
Anion gap: 11 (ref 5–15)
BUN: 16 mg/dL (ref 8–23)
CO2: 24 mmol/L (ref 22–32)
Calcium: 8.8 mg/dL — ABNORMAL LOW (ref 8.9–10.3)
Chloride: 102 mmol/L (ref 98–111)
Creatinine, Ser: 1 mg/dL (ref 0.44–1.00)
GFR calc Af Amer: >60 mL/min (ref >60)
GFR calc non Af Amer: 60 mL/min — ABNORMAL LOW (ref >60)
Glucose, Bld: 126 mg/dL — ABNORMAL HIGH (ref 70–99)
Potassium: 3.6 mmol/L (ref 3.5–5.1)
Sodium: 137 mmol/L (ref 135–145)

## 2019-11-12 LAB — CBC
HCT: 24.4 % — ABNORMAL LOW (ref 36.0–46.0)
Hemoglobin: 8 g/dL — ABNORMAL LOW (ref 12.0–15.0)
MCH: 29.4 pg (ref 26.0–34.0)
MCHC: 32.8 g/dL (ref 30.0–36.0)
MCV: 89.7 fL (ref 80.0–100.0)
Platelets: 275 10*3/uL (ref 150–400)
RBC: 2.72 MIL/uL — ABNORMAL LOW (ref 3.87–5.11)
RDW: 17.7 % — ABNORMAL HIGH (ref 11.5–15.5)
WBC: 9.8 10*3/uL (ref 4.0–10.5)
nRBC: 0.4 % — ABNORMAL HIGH (ref 0.0–0.2)

## 2019-11-12 MED ORDER — OXYCODONE HCL 5 MG PO TABS: 5.0000 mg | ORAL_TABLET | ORAL | 0 refills | Status: DC | PRN

## 2019-11-12 MED ORDER — IBUPROFEN 800 MG PO TABS: 800.0000 mg | ORAL_TABLET | Freq: Three times a day (TID) | ORAL | 0 refills | Status: AC | PRN

## 2019-11-12 NOTE — Progress Notes (Signed)
AVS gone over with patient and family, all questions answered

## 2019-11-12 NOTE — Discharge Summary (Signed)
[facility] SURGICAL DISCHARGE SUMMARY   Patient ID: Molly Walters MRN: [id number] DOB/AGE: 1955/05/06 64 y.o.  Admit date: 11/11/2019 Discharge date: 11/12/2019  Discharge Diagnoses Patient Active Problem List   Diagnosis Date Noted  . Breast cancer ([place]) 11/11/2019  . Malignant neoplasm of lower-inner quadrant of left breast in female, estrogen receptor negative ([place]) 05/13/2019    Consultants None  Procedures 11/11/2019:  Excision of Right Pot-a-cath Bilateral simple mastectomies, Bilateral sentinel node biopsies. Complex closure with tissue rearrangements measuring Chest wall 9x6 right side and 8x6 left side total 54+48= 102 cm2   HPI: Molly Walters is a 64 year old female well-known to me with diagnosis of triple negative left breast cancer that is about to complete neoadjuvant chemotherapy.  Did have a prior history of left breast cancer status post lumpectomy and radiation therapy.  More recently she did have an MRI showing evidence of an abnormality requiring a biopsy.  Please note that I have personally reviewed the MRI.  The pathology also shows DCIS.  He did develop leukopenia after cycle 10th of chemotherapy.  She is doing relatively well but she feels weak due to the chemo.  She is ready for definitive surgical management.  Dr. Janese Banks and I are in agreement that she is better served with bilateral mastectomy and sentinel lymph node biopsy on both sides.   Hospital Course: Informed consent was obtained and documented, and patient underwent uneventful bilateral mastectomy and port removal (Dr Dahlia Byes, 11/11/2019).  Post-operatively, patient did well and pain was reasonably and advancement of patient's diet and ambulation were well-tolerated. The remainder of patient's hospital course was essentially unremarkable, and discharge planning was initiated accordingly with patient safely able to be discharged home with appropriate discharge instructions, pain control, and  outpatient follow-up after all of her questions were answered to her expressed satisfaction.   Discharge Condition: Good   Physical Examination:  Constitutional: Well appearing female, NAD Pulmonary: Normal effort, no respiratory distress Skin: Bilateral mastectomy incisions and previous port incision are all CDI with dermabond, no erythema or drainage, breast binder in place. Bilateral JP drains with serosanguinous output.    Allergies as of 11/12/2019   No Known Allergies     Medication List    TAKE these medications   acetaminophen 500 MG tablet Commonly known as: TYLENOL Take 500 mg by mouth every 6 (six) hours as needed.   cholecalciferol 25 MCG (1000 UNIT) tablet Commonly known as: VITAMIN D3 Take 1,000 Units by mouth daily.   dexamethasone 4 MG tablet Commonly known as: DECADRON TAKE 2 TABLETS BY MOUTH ONCE A DAY ON THE DAY AFTER CHEMOTHERAPY AND THEN 2 TABLETS TWICE DAILY FOR 2 DAYS. TAKE WITH FOOD.   DULoxetine 30 MG capsule Commonly known as: CYMBALTA Take 1 capsule (30 mg total) by mouth daily. Increase to 60 mg daily after 1 week   hydrochlorothiazide 25 MG tablet Commonly known as: HYDRODIURIL Take 25 mg by mouth daily.   ibuprofen 800 MG tablet Commonly known as: ADVIL Take 1 tablet (800 mg total) by mouth every 8 (eight) hours as needed.   latanoprost 0.005 % ophthalmic solution Commonly known as: XALATAN Place 1 drop into both eyes at bedtime.   lidocaine-prilocaine cream Commonly known as: EMLA Apply to affected area once   loperamide 2 MG tablet Commonly known as: IMODIUM A-D Take 2 mg by mouth 4 (four) times daily as needed for diarrhea or loose stools.   LORazepam 0.5 MG tablet Commonly known as: Ativan Take 1 tablet (0.5  mg total) by mouth every 6 (six) hours as needed (Nausea or vomiting).   losartan 100 MG tablet Commonly known as: COZAAR Take 100 mg by mouth daily.   ondansetron 8 MG tablet Commonly known as: Zofran Take 1 tablet  (8 mg total) by mouth 2 (two) times daily as needed. Start on the third day after chemotherapy.   oxyCODONE 5 MG immediate release tablet Commonly known as: Oxy IR/ROXICODONE Take 1 tablet (5 mg total) by mouth every 4 (four) hours as needed for moderate pain, severe pain or breakthrough pain.   pantoprazole 20 MG tablet Commonly known as: PROTONIX TAKE 1 TABLET BY MOUTH ONCE DAILY.   potassium chloride SA 20 MEQ tablet Commonly known as: KLOR-CON Take 1 tablet (20 mEq total) by mouth 3 (three) times daily.   prochlorperazine 10 MG tablet Commonly known as: COMPAZINE Take 1 tablet (10 mg total) by mouth every 6 (six) hours as needed (Nausea or vomiting).   timolol 0.5 % ophthalmic solution Commonly known as: TIMOPTIC Place 1 drop into both eyes daily.         Follow-up Information    Pabon, [place] F, MD. Schedule an appointment as soon as possible for a visit on 11/17/2019.   Specialty: General Surgery Why: s/p bilateral mastectomy, has drains...patient prefers late morning/afternoon appointment Contact information: [address] [place] [address] [phone]                Time spent on discharge management including discussion of hospital course, clinical condition, outpatient instructions, prescriptions, and follow up with the patient and members of the medical team: >30 minutes  -- Edison Simon , PA-C [facility]  11/12/2019, 9:37 AM [phone] M-F: 7am - 4pm

## 2019-11-17 ENCOUNTER — Other Ambulatory Visit: Payer: Self-pay

## 2019-11-17 ENCOUNTER — Ambulatory Visit (INDEPENDENT_AMBULATORY_CARE_PROVIDER_SITE_OTHER): Payer: BC Managed Care – PPO | Admitting: Surgery

## 2019-11-17 ENCOUNTER — Encounter: Payer: Self-pay | Admitting: Surgery

## 2019-11-17 VITALS — BP 153/83 | HR 81 | Temp 98.1°F | Ht 66.0 in | Wt 167.0 lb

## 2019-11-17 DIAGNOSIS — Z09 Encounter for follow-up examination after completed treatment for conditions other than malignant neoplasm: Secondary | ICD-10-CM

## 2019-11-17 NOTE — Patient Instructions (Addendum)
Dr.Pabon advised patient to make sure drains are suctioning and then empty drain once a day if the amount is small, if there is a large amount of fluid then patient will empty twice a day. Patient will follow up with Dr.Pabon on Thursday August 5th at 1:30pm.  GENERAL POST-OPERATIVE PATIENT INSTRUCTIONS   WOUND CARE INSTRUCTIONS:  Keep a dry clean dressing on the wound if there is drainage. The initial bandage may be removed after 24 hours.  Once the wound has quit draining you may leave it open to air.  If clothing rubs against the wound or causes irritation and the wound is not draining you may cover it with a dry dressing during the daytime.  Try to keep the wound dry and avoid ointments on the wound unless directed to do so.  If the wound becomes bright red and painful or starts to drain infected material that is not clear, please contact your physician immediately.  If the wound is mildly pink and has a thick firm ridge underneath it, this is normal, and is referred to as a healing ridge.  This will resolve over the next 4-6 weeks.  BATHING: You may shower if you have been informed of this by your surgeon. However, Please do not submerge in a tub, hot tub, or pool until incisions are completely sealed or have been told by your surgeon that you may do so.  DIET:  You may eat any foods that you can tolerate.  It is a good idea to eat a high fiber diet and take in plenty of fluids to prevent constipation.  If you do become constipated you may want to take a mild laxative or take ducolax tablets on a daily basis until your bowel habits are regular.  Constipation can be very uncomfortable, along with straining, after recent surgery.  ACTIVITY:  You are encouraged to cough and deep breath or use your incentive spirometer if you were given one, every 15-30 minutes when awake.  This will help prevent respiratory complications and low grade fevers post-operatively if you had a general anesthetic.  You may  want to hug a pillow when coughing and sneezing to add additional support to the surgical area, if you had abdominal or chest surgery, which will decrease pain during these times.  You are encouraged to walk and engage in light activity for the next two weeks.  You should not lift more than 20 pounds, until 4 to 6 weeks after surgery as it could put you at increased risk for complications.  Twenty pounds is roughly equivalent to a plastic bag of groceries. At that time- Listen to your body when lifting, if you have pain when lifting, stop and then try again in a few days. Soreness after doing exercises or activities of daily living is normal as you get back in to your normal routine.  MEDICATIONS:  Try to take narcotic medications and anti-inflammatory medications, such as tylenol, ibuprofen, naprosyn, etc., with food.  This will minimize stomach upset from the medication.  Should you develop nausea and vomiting from the pain medication, or develop a rash, please discontinue the medication and contact your physician.  You should not drive, make important decisions, or operate machinery when taking narcotic pain medication.  SUNBLOCK Use sun block to incision area over the next year if this area will be exposed to sun. This helps decrease scarring and will allow you avoid a permanent darkened area over your incision.  QUESTIONS:  Please feel  free to call our office if you have any questions, and we will be glad to assist you.

## 2019-11-17 NOTE — Progress Notes (Signed)
Is a 64 year old female status post bilateral mastectomy for invasive carcinoma on the right side and DCIS of the left. Unfortunately she did not keep the pulse to suction. There has been minimal drainage from each drain. No fevers no chills  PE no acute distress. Upon inspection I noticed that both folds were off suction. I milked the JPs and placed on suction obtained about 20 cc of serous fluid from each drain. The wound looks well approximated no evidence of necrosis of the flaps. No evidence of infection  A/P  Doing very well after bilateral mastectomies. I would like to keep the drains since we will have an accurate report given that the drains were not on suction. Taught the patient and the family in detail about how to place staples and suction. I will see her back later this week on Thursday and hopefully we can remove drains. path still pending

## 2019-11-18 ENCOUNTER — Encounter: Payer: BC Managed Care – PPO | Admitting: Licensed Clinical Social Worker

## 2019-11-18 ENCOUNTER — Telehealth: Payer: Self-pay | Admitting: *Deleted

## 2019-11-18 LAB — SURGICAL PATHOLOGY

## 2019-11-18 NOTE — Telephone Encounter (Signed)
Called pt after speaking to [place] the genetic person. Dr. Janese Banks wanted pt to have genetic testing and we were not sure if pt mets criteria to get test. Per [place] Ar- she was on her list to see and she does qualify. She had tried to do visit via video and it never worked out for the pt. She is willing to do the counseling on 8/12 when pt comes to office. I called pt and asked her if this is ok and she is agreeable. I told her that since she has labs done before seeing counselor we will add the tube of blood to send off for genetic testing ; however if she speaks to counselor and does not want to move forward with the test then we will cancel the order and discard the blood. Pt. Is in agreement with this

## 2019-11-20 ENCOUNTER — Encounter: Payer: Self-pay | Admitting: Surgery

## 2019-11-20 ENCOUNTER — Other Ambulatory Visit: Payer: Self-pay

## 2019-11-20 ENCOUNTER — Ambulatory Visit (INDEPENDENT_AMBULATORY_CARE_PROVIDER_SITE_OTHER): Payer: Self-pay | Admitting: Surgery

## 2019-11-20 VITALS — BP 129/86 | HR 101 | Temp 97.8°F | Resp 12 | Ht 66.0 in | Wt 158.0 lb

## 2019-11-20 DIAGNOSIS — Z09 Encounter for follow-up examination after completed treatment for conditions other than malignant neoplasm: Secondary | ICD-10-CM

## 2019-11-20 NOTE — Patient Instructions (Addendum)
Please keep a dry dressing over the drain sites until they heal completely.Please begin wearing a sports bra today.

## 2019-11-20 NOTE — Progress Notes (Signed)
64 year old female status post mastectomy.  Doing very well.  No complaints at this time pathology reviewed with the patient detail.  Negative margins and negative nodes.  She did have invasive carcinoma the left side and no residual cancer on the right side. He has been emptying the drains daily but unfortunately she has not had a good track record of the output.  She reports that approximately she has been draining" from 20 to 30 cc from each drain.   PE NAD  Wound healing well with flaps viable.  No evidence of infection.  Serous fluid.  We went ahead and removed both drains.  On the right side it seems to be a little bit more wet.  Dry dressings were applied  A/p doing very well.  I am a little concerned the right side as there was some fluid that came out after the drain was removed.  She may reaccumulate or develop a seroma on the right side .  We will follow her up in 10 days or so.

## 2019-11-27 ENCOUNTER — Encounter: Payer: Self-pay | Admitting: Licensed Clinical Social Worker

## 2019-11-27 ENCOUNTER — Inpatient Hospital Stay (HOSPITAL_BASED_OUTPATIENT_CLINIC_OR_DEPARTMENT_OTHER): Payer: BC Managed Care – PPO | Admitting: Licensed Clinical Social Worker

## 2019-11-27 ENCOUNTER — Encounter: Payer: Self-pay | Admitting: Oncology

## 2019-11-27 ENCOUNTER — Other Ambulatory Visit: Payer: Self-pay

## 2019-11-27 ENCOUNTER — Inpatient Hospital Stay: Payer: BC Managed Care – PPO | Attending: Oncology | Admitting: Oncology

## 2019-11-27 ENCOUNTER — Inpatient Hospital Stay: Payer: BC Managed Care – PPO

## 2019-11-27 VITALS — BP 135/81 | HR 76 | Temp 98.9°F | Resp 16 | Ht 66.0 in | Wt 170.3 lb

## 2019-11-27 DIAGNOSIS — Z1379 Encounter for other screening for genetic and chromosomal anomalies: Secondary | ICD-10-CM

## 2019-11-27 DIAGNOSIS — G620 Drug-induced polyneuropathy: Secondary | ICD-10-CM

## 2019-11-27 DIAGNOSIS — I10 Essential (primary) hypertension: Secondary | ICD-10-CM | POA: Insufficient documentation

## 2019-11-27 DIAGNOSIS — Z853 Personal history of malignant neoplasm of breast: Secondary | ICD-10-CM | POA: Diagnosis present

## 2019-11-27 DIAGNOSIS — C50312 Malignant neoplasm of lower-inner quadrant of left female breast: Secondary | ICD-10-CM

## 2019-11-27 DIAGNOSIS — E876 Hypokalemia: Secondary | ICD-10-CM | POA: Diagnosis not present

## 2019-11-27 DIAGNOSIS — Z923 Personal history of irradiation: Secondary | ICD-10-CM | POA: Diagnosis not present

## 2019-11-27 DIAGNOSIS — Z87891 Personal history of nicotine dependence: Secondary | ICD-10-CM | POA: Diagnosis not present

## 2019-11-27 DIAGNOSIS — Z7189 Other specified counseling: Secondary | ICD-10-CM | POA: Diagnosis not present

## 2019-11-27 DIAGNOSIS — T451X5A Adverse effect of antineoplastic and immunosuppressive drugs, initial encounter: Secondary | ICD-10-CM

## 2019-11-27 DIAGNOSIS — Z9221 Personal history of antineoplastic chemotherapy: Secondary | ICD-10-CM | POA: Insufficient documentation

## 2019-11-27 DIAGNOSIS — Z9013 Acquired absence of bilateral breasts and nipples: Secondary | ICD-10-CM | POA: Diagnosis not present

## 2019-11-27 DIAGNOSIS — Z79899 Other long term (current) drug therapy: Secondary | ICD-10-CM | POA: Insufficient documentation

## 2019-11-27 DIAGNOSIS — Z809 Family history of malignant neoplasm, unspecified: Secondary | ICD-10-CM | POA: Insufficient documentation

## 2019-11-27 DIAGNOSIS — Z171 Estrogen receptor negative status [ER-]: Secondary | ICD-10-CM

## 2019-11-27 LAB — CBC WITH DIFFERENTIAL/PLATELET
Abs Immature Granulocytes: 0.05 10*3/uL (ref 0.00–0.07)
Basophils Absolute: 0 10*3/uL (ref 0.0–0.1)
Basophils Relative: 0 %
Eosinophils Absolute: 0 10*3/uL (ref 0.0–0.5)
Eosinophils Relative: 1 %
HCT: 30.6 % — ABNORMAL LOW (ref 36.0–46.0)
Hemoglobin: 9.9 g/dL — ABNORMAL LOW (ref 12.0–15.0)
Immature Granulocytes: 1 %
Lymphocytes Relative: 24 %
Lymphs Abs: 1.5 10*3/uL (ref 0.7–4.0)
MCH: 28.5 pg (ref 26.0–34.0)
MCHC: 32.4 g/dL (ref 30.0–36.0)
MCV: 88.2 fL (ref 80.0–100.0)
Monocytes Absolute: 1 10*3/uL (ref 0.1–1.0)
Monocytes Relative: 16 %
Neutro Abs: 3.6 10*3/uL (ref 1.7–7.7)
Neutrophils Relative %: 58 %
Platelets: 366 10*3/uL (ref 150–400)
RBC: 3.47 MIL/uL — ABNORMAL LOW (ref 3.87–5.11)
RDW: 17.2 % — ABNORMAL HIGH (ref 11.5–15.5)
WBC: 6.3 10*3/uL (ref 4.0–10.5)
nRBC: 0.6 % — ABNORMAL HIGH (ref 0.0–0.2)

## 2019-11-27 LAB — COMPREHENSIVE METABOLIC PANEL
ALT: 13 U/L (ref 0–44)
AST: 18 U/L (ref 15–41)
Albumin: 3.7 g/dL (ref 3.5–5.0)
Alkaline Phosphatase: 84 U/L (ref 38–126)
Anion gap: 9 (ref 5–15)
BUN: 11 mg/dL (ref 8–23)
CO2: 27 mmol/L (ref 22–32)
Calcium: 9.6 mg/dL (ref 8.9–10.3)
Chloride: 99 mmol/L (ref 98–111)
Creatinine, Ser: 0.7 mg/dL (ref 0.44–1.00)
GFR calc Af Amer: >60 mL/min (ref >60)
GFR calc non Af Amer: >60 mL/min (ref >60)
Glucose, Bld: 107 mg/dL — ABNORMAL HIGH (ref 70–99)
Potassium: 3.5 mmol/L (ref 3.5–5.1)
Sodium: 135 mmol/L (ref 135–145)
Total Bilirubin: 0.6 mg/dL (ref 0.3–1.2)
Total Protein: 7.7 g/dL (ref 6.5–8.1)

## 2019-11-27 MED ORDER — PANTOPRAZOLE SODIUM 40 MG PO TBEC
40.0000 mg | DELAYED_RELEASE_TABLET | Freq: Every day | ORAL | 2 refills | Status: DC
Start: 2019-11-27 — End: 2020-08-19

## 2019-11-27 MED ORDER — POTASSIUM CHLORIDE CRYS ER 20 MEQ PO TBCR: 20.0000 meq | EXTENDED_RELEASE_TABLET | Freq: Three times a day (TID) | ORAL | 3 refills | Status: DC

## 2019-11-27 NOTE — Progress Notes (Signed)
REFERRING PROVIDER: Sindy Guadeloupe, MD [address],  [place] [address]  PRIMARY PROVIDER:  Lanny Hurst, MD  PRIMARY REASON FOR VISIT:  1. Family history of cancer      HISTORY OF PRESENT ILLNESS:   Molly Walters, a 64 y.o. female, was seen for a [facility] cancer genetics consultation at the request of Dr. Janese Banks due to a personal and family history of cancer.  Molly Walters presents to clinic today to discuss the possibility of a hereditary predisposition to cancer, genetic testing, and to further clarify her future cancer risks, as well as potential cancer risks for family members.   In 2001, at the age of 89, Molly Walters was diagnosed with right breast cancer that was treated with lumpectomy and radiation.  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 The treatment plan has included neoadjuvant chemotherapy and bilateral mastectomy.     CANCER HISTORY:  Oncology History  Malignant neoplasm of lower-inner quadrant of left breast in female, estrogen receptor negative (Molly Walters)  05/12/2019 Cancer Staging   Staging form: Breast, AJCC 8th Edition - Clinical stage from 05/12/2019: Stage IB (cT1c, cN0, cM0, G3, ER-, PR-, HER2-) - Signed by Sindy Guadeloupe, MD on 05/13/2019   05/13/2019 Initial Diagnosis   Malignant neoplasm of lower-inner quadrant of left breast in female, estrogen receptor negative ([place])   05/22/2019 -  Chemotherapy   The patient had dexamethasone (DECADRON) 4 MG tablet, 1 of 1 cycle, Start date: 08/06/2019, End date: -- DOXOrubicin (ADRIAMYCIN) chemo injection 112 mg, 60 mg/m2 = 112 mg, Intravenous,  Once, 4 of 4 cycles Administration: 112 mg (05/22/2019), 112 mg (06/05/2019), 112 mg (06/19/2019), 112 mg (07/03/2019) palonosetron (ALOXI) injection 0.25 mg, 0.25 mg, Intravenous,  Once, 4 of 4 cycles Administration: 0.25 mg (05/22/2019), 0.25 mg (06/05/2019), 0.25 mg (06/19/2019), 0.25 mg  (07/03/2019) pegfilgrastim-cbqv (UDENYCA) injection 6 mg, 6 mg, Subcutaneous, Once, 4 of 4 cycles Administration: 6 mg (05/23/2019), 6 mg (06/06/2019), 6 mg (06/20/2019), 6 mg (07/04/2019) CARBOplatin (PARAPLATIN) 160 mg in sodium chloride 0.9 % 100 mL chemo infusion, 160 mg (100 % of original dose 162.9 mg), Intravenous,  Once, 12 of 12 cycles Dose modification:   (original dose 162.9 mg, Cycle 6) Administration: 160 mg (07/17/2019), 160 mg (07/24/2019), 160 mg (07/31/2019), 160 mg (08/08/2019), 160 mg (08/21/2019), 160 mg (08/28/2019), 160 mg (09/04/2019), 160 mg (09/11/2019), 160 mg (09/18/2019), 160 mg (10/02/2019), 160 mg (10/09/2019), 160 mg (10/17/2019) cyclophosphamide (CYTOXAN) 1,120 mg in sodium chloride 0.9 % 250 mL chemo infusion, 600 mg/m2 = 1,120 mg, Intravenous,  Once, 4 of 4 cycles Administration: 1,120 mg (05/22/2019), 1,120 mg (06/05/2019), 1,120 mg (06/19/2019), 1,120 mg (07/03/2019) PACLitaxel (TAXOL) 150 mg in sodium chloride 0.9 % 250 mL chemo infusion (</= 38m/m2), 80 mg/m2 = 150 mg, Intravenous,  Once, 12 of 12 cycles Dose modification: 65 mg/m2 (original dose 80 mg/m2, Cycle 14, Reason: Other (see comments), Comment: neuropathy) Administration: 150 mg (07/17/2019), 150 mg (07/24/2019), 150 mg (07/31/2019), 150 mg (08/08/2019), 150 mg (08/21/2019), 150 mg (08/28/2019), 150 mg (09/04/2019), 120 mg (09/18/2019), 120 mg (10/02/2019), 120 mg (10/09/2019), 120 mg (10/17/2019) fosaprepitant (EMEND) 150 mg in sodium chloride 0.9 % 145 mL IVPB, 150 mg, Intravenous,  Once, 4 of 4 cycles Administration: 150 mg (05/22/2019), 150 mg (06/05/2019), 150 mg (06/19/2019), 150 mg (07/03/2019)  for chemotherapy treatment.       RISK FACTORS:  Menarche was at age 40.  First live birth at age 61.  OCP use for approximately 2 years.  Ovaries intact: yes.  Hysterectomy: yes.  Menopausal status: postmenopausal.  HRT use: 0 years. Colonoscopy: yes; normal. Mammogram within the last year: yes. Number of breast biopsies: 4.   Past Medical  History:  Diagnosis Date   Breast cancer ([facility]) 2001   left breast lumpectomy and rad tx   Breast cancer ([place]) 02/2019   left breast mass w/ biopsy   Breast cancer ([facility]) 2021   right DCIS   Family history of cancer    GERD (gastroesophageal reflux disease)    Hypertension    Personal history of radiation therapy 2001   BREAST CA    Past Surgical History:  Procedure Laterality Date   ABDOMINAL HYSTERECTOMY     BREAST BIOPSY Left 2001   POS   BREAST BIOPSY Left 2021   positive   BREAST BIOPSY Right 2021   DCIS   BREAST LUMPECTOMY Left 2001   PORT-A-CATH REMOVAL N/A 11/11/2019   Procedure: REMOVAL PORT-A-CATH;  Surgeon: Jules Husbands, MD;  Location: [facility] ORS;  Service: General;  Laterality: N/A;   PORTACATH PLACEMENT Right 05/16/2019   Procedure: INSERTION PORT-A-CATH;  Surgeon: Jules Husbands, MD;  Location: [facility] ORS;  Service: General;  Laterality: Right;   SIMPLE MASTECTOMY WITH AXILLARY SENTINEL NODE BIOPSY Bilateral 11/11/2019   Procedure: SIMPLE MASTECTOMY WITH AXILLARY SENTINEL NODE BIOPSY;  Surgeon: Jules Husbands, MD;  Location: [facility] ORS;  Service: General;  Laterality: Bilateral;    Social History   Socioeconomic History   Marital status: Divorced    Spouse name: Not on file   Number of children: Not on file   Years of education: Not on file   Highest education level: Not on file  Occupational History   Not on file  Tobacco Use   Smoking status: Former Smoker    Packs/day: 0.50    Years: 5.00    Pack years: 2.50    Types: Cigarettes    Quit date: 1970    Years since quitting: 51.6   Smokeless tobacco: Never Used  Scientific laboratory technician Use: Never used  Substance and Sexual Activity   Alcohol use: Never   Drug use: Never   Sexual activity: Not Currently  Other Topics Concern   Not on file  Social History Narrative   Not on file   Social Determinants of Health   Financial Resource Strain:    Difficulty of Paying Living  Expenses:   Food Insecurity:    Worried About Charity fundraiser in the Last Year:    Arboriculturist in the Last Year:   Transportation Needs:    Film/video editor (Medical):    Lack of Transportation (Non-Medical):   Physical Activity:    Days of Exercise per Week:    Minutes of Exercise per Session:   Stress:    Feeling of Stress :   Social Connections:    Frequency of Communication with Friends and Family:    Frequency of Social Gatherings with Friends and Family:    Attends Religious Services:    Active Member of Clubs or Organizations:    Attends Music therapist:    Marital Status:      FAMILY HISTORY:  We obtained a detailed, 4-generation family history.  Significant diagnoses are listed below: Family History  Problem Relation Age of Onset   Hypertension Mother  Cancer Maternal Grandmother    Cancer Maternal Aunt        unk type   Breast cancer Neg Hx    Molly Walters has 1 son, 82. She has 1 sister, 35 and 3 brothers, no cancers.   Molly Walters mother died at 25. Patient had 4 maternal aunts and 2 maternal uncles. One aunt did have cancer, unsure of the type. No known cancers in first cousins. She does note a maternal second cousin had breast cancer. Maternal grandmother also had cancer, unknown type. Grandfather died over 42.   Molly Walters father died at 6. Patient had 4 paternal uncles, 2 aunts, no cancers. No known cancers in cousins. Paternal grandmother had leukemia.   Molly Walters is unaware of previous family history of genetic testing for hereditary cancer risks. Patient's maternal ancestors are of [place] American descent, and paternal ancestors are of [place] descent. There is no reported Ashkenazi Jewish ancestry. There is no known consanguinity.  GENETIC COUNSELING ASSESSMENT: Molly Walters is a 64 y.o. female with a personal history which is somewhat suggestive of a hereditary cancer syndrome and  predisposition to cancer. We, therefore, discussed and recommended the following at today's visit.   DISCUSSION: We discussed that 5-10% of cancer is hereditary meaning that it is due to a mutation in a single gene that is passed down from generation to generation in a family. Most cases of hereditary breast cancer are associated with BRCA1/BRCA2 genes, although there are other genes associated with hereditary  cancer as well.  We discussed that testing is beneficial for several reasons including knowing if she is a candidate for certain treatments, knowing about other cancer risks, identifying potential screening and risk-reduction options that may be appropriate, and to understand if other family members could be at risk for cancer and allow them to undergo genetic testing.   We reviewed the characteristics, features and inheritance patterns of hereditary cancer syndromes. We also discussed genetic testing, including the appropriate family members to test, the process of testing, insurance coverage and turn-around-time for results. We discussed the implications of a negative, positive and/or variant of uncertain significant result. We recommended Molly Walters pursue genetic testing for the [facility] Common Hereditary Cancers gene panel.   The Common Hereditary Cancers Panel offered by [facility] includes sequencing and/or deletion duplication testing of the following 48 genes: APC, ATM, AXIN2, BARD1, BMPR1A, BRCA1, BRCA2, BRIP1, CDH1, CDKN2A (p14ARF), CDKN2A (p16INK4a), CKD4, CHEK2, CTNNA1, DICER1, EPCAM (Deletion/duplication testing only), GREM1 (promoter region deletion/duplication testing only), KIT, MEN1, MLH1, MSH2, MSH3, MSH6, MUTYH, NBN, NF1, NHTL1, PALB2, PDGFRA, PMS2, POLD1, POLE, PTEN, RAD50, RAD51C, RAD51D, RNF43, SDHB, SDHC, SDHD, SMAD4, SMARCA4. STK11, TP53, TSC1, TSC2, and VHL.  The following genes were evaluated for sequence changes only: SDHA and HOXB13 c.251G>A variant only.  Based on Molly Walters's   personal history of cancer, she meets medical criteria for genetic testing. Despite that she meets criteria, she may still have an out of pocket cost.   PLAN: After considering the risks, benefits, and limitations, Molly Walters provided informed consent to pursue genetic testing and the blood sample was sent to [facility] for analysis of the Common Hereditary Cancers Panel. Results should be available within approximately 2-3 weeks' time, at which point they will be disclosed by telephone to Molly Walters, as will any additional recommendations warranted by these results. Molly Walters will receive a summary of her genetic counseling visit and a copy of her results once available. This information  will also be available in Epic.   Molly Walters questions were answered to her satisfaction today. Our contact information was provided should additional questions or concerns arise. Thank you for the referral and allowing [place] to share in the care of your patient.   Faith Rogue, MS, [facility] Genetic Counselor [email or web address] Phone: [phone]  The patient was seen for a total of 25 minutes in face-to-face genetic counseling. Patient's sister Molly Walters was also present. Dr. Grayland Ormond was available for discussion regarding this case.   _______________________________________________________________________ For Office Staff:  Number of people involved in session: 2 Was an Intern/ student involved with case: no

## 2019-11-27 NOTE — Progress Notes (Signed)
Pt states appetite is dec by 25 to 35 % because of reflux. Has med and

## 2019-11-28 ENCOUNTER — Telehealth: Payer: Self-pay | Admitting: Pharmacy Technician

## 2019-11-28 MED ORDER — CAPECITABINE 500 MG PO TABS
1000.0000 mg/m2 | ORAL_TABLET | Freq: Two times a day (BID) | ORAL | 0 refills | Status: AC
Start: 2019-11-28 — End: 2019-12-28

## 2019-11-28 NOTE — Telephone Encounter (Signed)
Oral Oncology Patient Advocate Encounter   Received notification from [facility] that prior authorization for Xeloda is required.   PA submitted on CoverMyMeds Key [id number] Status is pending   Oral Oncology Clinic will continue to follow.  [facility] [facility] [address] Phone [phone] Fax [phone] 12/09/2019 8:28 AM

## 2019-11-28 NOTE — Progress Notes (Addendum)
Hematology/Oncology Consult note [facility]  [phone] [phone]  Patient Care Team: Lanny Hurst, MD as PCP - General (Internal Medicine) Rico Junker, RN as Registered Nurse Theodore Demark, RN as Registered Nurse Sindy Guadeloupe, MD as Consulting Physician (Oncology)   Name of the patient: Molly Walters  [id number]  11-09-1955   Date of visit: 11/28/19  Diagnosis- clinical prognostic stage Ia invasive mammary carcinoma cmT1 ccN0 cM0 ER/PR negative and HER-2/neu negative   Chief complaint/ Reason for visit-discuss final pathology results and further management  Heme/Onc history: patient is a 64 year old African-American female with past medical history significant for hypertension. She has had prior breast cancer in 2001 s/p lumpectomy followed by adjuvant radiation treatment. She did not require adjuvant chemotherapy. She does not remember taking any endocrine therapy as well.More recently patient underwent a bilateral screening mammogram in November 2020 which showed a possible mass in the left breast. This was followed by a diagnostic mammogram and ultrasound which showed a 1.6 cm mass at the 7 o'clock position in the left breast near the lumpectomy scar. No left axillary adenopathy. 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   MRI bilateral breast showed:The size of the primary left breast mass appears larger on MRI close to 2.4 x 1.9 x 1.7 cm. She is also noted to have 2 additional enhancing masses 9 x 6 x 10 mm and 5 x 5 x 5 mm in the left breast with suspicious washout kinetics. No pathological left axillary adenopathy. Prominent lymph nodes seen on the right side along with non-mass enhancement spanning 5.3 cm in the AP dimension.Both the enhancing masses in the left breast were positive for triple negative breast cancer. Right sided axillary lymph  node was negative for malignancy.Right breast biopsy showed DCIS  Patient has completed neoadjuvant dose dense AC chemotherapy and 12 weekly cycles of carbotaxol chemotherapy on 10/16/2019.    Patient underwent bilateral mastectomy without reconstruction.  Residual 10 mm of carcinoma was seen in the left breast.  No evidence of malignancy in the right breast.  No evidence of residual in situ carcinoma in the right breast.  One sentinel lymph node on the left and three sentinel lymph nodes on the right were negative for malignancy.  Overall cancer cellularity was 10%   Interval history-patient will be going for her cataract surgery in her right eye next week followed by left eye a couple of weeks later.  Reports that her neuropathy is getting better after she started Cymbalta.  Reports ongoing fatigue but denies other complaints  ECOG PS- 1 Pain scale- 0   Review of systems- Review of Systems  Constitutional: Positive for malaise/fatigue. Negative for chills, fever and weight loss.  HENT: Negative for congestion, ear discharge and nosebleeds.   Eyes: Negative for blurred vision.  Respiratory: Negative for cough, hemoptysis, sputum production, shortness of breath and wheezing.   Cardiovascular: Negative for chest pain, palpitations, orthopnea and claudication.  Gastrointestinal: Negative for abdominal pain, blood in stool, constipation, diarrhea, heartburn, melena, nausea and vomiting.  Genitourinary: Negative for dysuria, flank pain, frequency, hematuria and urgency.  Musculoskeletal: Negative for back pain, joint pain and myalgias.  Skin: Negative for rash.  Neurological: Positive for sensory change (Peripheral neuropathy). Negative for dizziness, tingling, focal weakness, seizures, weakness and headaches.  Endo/Heme/Allergies: Does not bruise/bleed easily.  Psychiatric/Behavioral: Negative for depression and suicidal ideas. The patient does not have insomnia.  No Known  Allergies   Past Medical History:  Diagnosis Date  . Breast cancer ([place]) 2001   left breast lumpectomy and rad tx  . Breast cancer ([place]) 02/2019   left breast mass w/ biopsy  . Breast cancer ([place]) 2021   right DCIS  . Family history of cancer   . GERD (gastroesophageal reflux disease)   . History of breast cancer   . Hypertension   . Personal history of radiation therapy 2001   BREAST CA     Past Surgical History:  Procedure Laterality Date  . ABDOMINAL HYSTERECTOMY    . BREAST BIOPSY Left 2001   POS  . BREAST BIOPSY Left 2021   positive  . BREAST BIOPSY Right 2021   DCIS  . BREAST LUMPECTOMY Left 2001  . PORT-A-CATH REMOVAL N/A 11/11/2019   Procedure: REMOVAL PORT-A-CATH;  Surgeon: Jules Husbands, MD;  Location: [facility] ORS;  Service: General;  Laterality: N/A;  . PORTACATH PLACEMENT Right 05/16/2019   Procedure: INSERTION PORT-A-CATH;  Surgeon: Jules Husbands, MD;  Location: [facility] ORS;  Service: General;  Laterality: Right;  . SIMPLE MASTECTOMY WITH AXILLARY SENTINEL NODE BIOPSY Bilateral 11/11/2019   Procedure: SIMPLE MASTECTOMY WITH AXILLARY SENTINEL NODE BIOPSY;  Surgeon: Jules Husbands, MD;  Location: [facility] ORS;  Service: General;  Laterality: Bilateral;    Social History   Socioeconomic History  . Marital status: Divorced    Spouse name: Not on file  . Number of children: Not on file  . Years of education: Not on file  . Highest education level: Not on file  Occupational History  . Not on file  Tobacco Use  . Smoking status: Former Smoker    Packs/day: 0.50    Years: 5.00    Pack years: 2.50    Types: Cigarettes    Quit date: 1970    Years since quitting: 51.6  . Smokeless tobacco: Never Used  Vaping Use  . Vaping Use: Never used  Substance and Sexual Activity  . Alcohol use: Never  . Drug use: Never  . Sexual activity: Not Currently  Other Topics Concern  . Not on file  Social History Narrative  . Not on file   Social Determinants of Health    Financial Resource Strain:   . Difficulty of Paying Living Expenses:   Food Insecurity:   . Worried About Charity fundraiser in the Last Year:   . Arboriculturist in the Last Year:   Transportation Needs:   . Film/video editor (Medical):   Marland Kitchen Lack of Transportation (Non-Medical):   Physical Activity:   . Days of Exercise per Week:   . Minutes of Exercise per Session:   Stress:   . Feeling of Stress :   Social Connections:   . Frequency of Communication with Friends and Family:   . Frequency of Social Gatherings with Friends and Family:   . Attends Religious Services:   . Active Member of Clubs or Organizations:   . Attends Archivist Meetings:   Marland Kitchen Marital Status:   Intimate Partner Violence:   . Fear of Current or Ex-Partner:   . Emotionally Abused:   Marland Kitchen Physically Abused:   . Sexually Abused:     Family History  Problem Relation Age of Onset  . Hypertension Mother   . Cancer Maternal Grandmother   . Leukemia Paternal Grandmother   . Cancer Maternal Aunt        unk type  .  Breast cancer Neg Hx      Current Outpatient Medications:  .  acetaminophen (TYLENOL) 500 MG tablet, Take 500 mg by mouth every 6 (six) hours as needed., Disp: , Rfl:  .  cholecalciferol (VITAMIN D3) 25 MCG (1000 UNIT) tablet, Take 1,000 Units by mouth daily., Disp: , Rfl:  .  dorzolamide-timolol (COSOPT) 22.3-6.8 MG/ML ophthalmic solution, Administer 1 drop to both eyes Two (2) times a day., Disp: , Rfl:  .  DULoxetine (CYMBALTA) 30 MG capsule, Take 1 capsule (30 mg total) by mouth daily. Increase to 60 mg daily after 1 week, Disp: 60 capsule, Rfl: 3 .  hydrochlorothiazide (HYDRODIURIL) 25 MG tablet, Take 25 mg by mouth daily., Disp: , Rfl:  .  ibuprofen (ADVIL) 800 MG tablet, Take 1 tablet (800 mg total) by mouth every 8 (eight) hours as needed., Disp: 30 tablet, Rfl: 0 .  latanoprost (XALATAN) 0.005 % ophthalmic solution, Place 1 drop into both eyes at bedtime. , Disp: , Rfl:  .   loperamide (IMODIUM A-D) 2 MG tablet, Take 2 mg by mouth 4 (four) times daily as needed for diarrhea or loose stools., Disp: , Rfl:  .  LORazepam (ATIVAN) 0.5 MG tablet, Take 1 tablet (0.5 mg total) by mouth every 6 (six) hours as needed (Nausea or vomiting)., Disp: 30 tablet, Rfl: 0 .  losartan (COZAAR) 100 MG tablet, Take 100 mg by mouth daily., Disp: , Rfl:  .  oxyCODONE (OXY IR/ROXICODONE) 5 MG immediate release tablet, Take 1 tablet (5 mg total) by mouth every 4 (four) hours as needed for moderate pain, severe pain or breakthrough pain., Disp: 30 tablet, Rfl: 0 .  potassium chloride SA (KLOR-CON) 20 MEQ tablet, Take 1 tablet (20 mEq total) by mouth 3 (three) times daily., Disp: 90 tablet, Rfl: 3 .  timolol (TIMOPTIC) 0.5 % ophthalmic solution, Place 1 drop into both eyes daily. , Disp: , Rfl:  .  ondansetron (ZOFRAN) 8 MG tablet, Take 1 tablet (8 mg total) by mouth 2 (two) times daily as needed. Start on the third day after chemotherapy. (Patient not taking: Reported on 11/27/2019), Disp: 30 tablet, Rfl: 1 .  pantoprazole (PROTONIX) 40 MG tablet, Take 1 tablet (40 mg total) by mouth daily., Disp: 30 tablet, Rfl: 2 .  prochlorperazine (COMPAZINE) 10 MG tablet, Take 1 tablet (10 mg total) by mouth every 6 (six) hours as needed (Nausea or vomiting). (Patient not taking: Reported on 11/27/2019), Disp: 30 tablet, Rfl: 1 No current facility-administered medications for this visit.  Facility-Administered Medications Ordered in Other Visits:  .  sodium chloride flush (NS) 0.9 % injection 10 mL, 10 mL, Intravenous, PRN, Sindy Guadeloupe, MD, 10 mL at 10/09/19 0815  Physical exam:  Vitals:   11/27/19 1339  BP: 135/81  Pulse: 76  Resp: 16  Temp: 98.9 F (37.2 C)  TempSrc: Oral  Weight: 170 lb 4.8 oz (77.2 kg)  Height: _0  (1.676 m)   Physical Exam Constitutional:      Comments: Sitting in a wheelchair.  Appears in no acute distress brace in place over the right knee  HENT:     Head:  Normocephalic and atraumatic.  Eyes:     Pupils: Pupils are equal, round, and reactive to light.  Cardiovascular:     Rate and Rhythm: Normal rate and regular rhythm.     Heart sounds: Normal heart sounds.  Pulmonary:     Effort: Pulmonary effort is normal.     Breath sounds: Normal breath  sounds.  Abdominal:     General: Bowel sounds are normal.     Palpations: Abdomen is soft.  Musculoskeletal:     Cervical back: Normal range of motion.  Skin:    General: Skin is warm and dry.  Neurological:     Mental Status: She is alert and oriented to person, place, and time.      CMP Latest Ref Rng & Units 11/27/2019  Glucose 70 - 99 mg/dL 107(H)  BUN 8 - 23 mg/dL 11  Creatinine 0.44 - 1.00 mg/dL 0.70  Sodium 135 - 145 mmol/L 135  Potassium 3.5 - 5.1 mmol/L 3.5  Chloride 98 - 111 mmol/L 99  CO2 22 - 32 mmol/L 27  Calcium 8.9 - 10.3 mg/dL 9.6  Total Protein 6.5 - 8.1 g/dL 7.7  Total Bilirubin 0.3 - 1.2 mg/dL 0.6  Alkaline Phos 38 - 126 U/L 84  AST 15 - 41 U/L 18  ALT 0 - 44 U/L 13   CBC Latest Ref Rng & Units 11/27/2019  WBC 4.0 - 10.5 K/uL 6.3  Hemoglobin 12.0 - 15.0 g/dL 9.9(L)  Hematocrit 36 - 46 % 30.6(L)  Platelets 150 - 400 K/uL 366    No images are attached to the encounter.  NM SENTINEL NODE INJECTION  Result Date: 11/11/2019 CLINICAL DATA:  Bilateral breast cancer. EXAM: NUCLEAR MEDICINE BREAST LYMPHOSCINTIGRAPHY TECHNIQUE: Intradermal injection of radiopharmaceutical was performed at the 12 o'clock, 3 o'clock, 6 o'clock, and 9 o'clock positions around the bilateral nipples. The patient was then sent to the operating room where the sentinel node(s) were identified and removed by the surgeon. RADIOPHARMACEUTICALS:  Total of 1 mCi Millipore-filtered Technetium-10msulfur colloid, injected in four aliquots of 0.25 mCi around each nipple. IMPRESSION: Uncomplicated intradermal injection of a total of 1 mCi Technetium-911mulfur colloid about each nipple for purposes of bilateral  sentinel lymph node identification. Electronically Signed   By: JoSandi Mariscal.D.   On: 11/11/2019 08:45   NM SENTINEL NODE INJECTION  Result Date: 11/11/2019 CLINICAL DATA:  Bilateral breast cancer. EXAM: NUCLEAR MEDICINE BREAST LYMPHOSCINTIGRAPHY TECHNIQUE: Intradermal injection of radiopharmaceutical was performed at the 12 o'clock, 3 o'clock, 6 o'clock, and 9 o'clock positions around the bilateral nipples. The patient was then sent to the operating room where the sentinel node(s) were identified and removed by the surgeon. RADIOPHARMACEUTICALS:  Total of 1 mCi Millipore-filtered Technetium-9962mlfur colloid, injected in four aliquots of 0.25 mCi around each nipple. IMPRESSION: Uncomplicated intradermal injection of a total of 1 mCi Technetium-[id number] colloid about each nipple for purposes of bilateral sentinel lymph node identification. Electronically Signed   By: JohnSandi Mariscal.   On: 11/11/2019 08:45     Assessment and plan- Patient is a 64 y88. female clinical prognostic stage Ia invasive mammary carcinoma mcT1 ccN0 cM0 ER/PR negative and HER-2/neu negative.  She is s/p neoadjuvant dose dense AC chemotherapy followed by 12 weekly cycles of carbotaxol chemotherapy.  She is here to discuss final pathology results and further management  1.  Final pathology showed 10 mm residual tumor in her left breast.  Sentinel lymph nodes were negative for malignancy.  Overall cancer cellularity was 10%.  Given the fact that she did not achieve a pathological complete response in the setting of triple negative breast cancer, adjuvant treatment would be my recommendation.  She will be seeing genetics today and going through BRCA testing.  If she is BRCA positive collaborative remains an option.  However in the absence of BRCA mutation  I would recommend Xeloda 1000 mg per metered square twice daily 2 weeks on 1 week off for eight cycles.  Discussed risks and benefits of Xeloda including all but not limited to  fatigue, nausea, vomiting, diarrhea, skin rash.  Treatment will be given with a curative intent.  Patient understands and agrees to proceed as planned.  We will work on Government social research officer for the same.  I will await clearance from surgical standpoint to allow for wound healing postmastectomy before starting Xeloda  2.  Since patient is s/p bilateral mastectomies with negative sentinel lymph node she would not benefit from adjuvant radiation treatment and I have discussed with Dr. Baruch Gouty about this as well.  And he agrees.  3.  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  Final pathology did not show any residual DCIS.  Also since patient is s/p bilateral mastectomy she would not benefit from any adjuvant endocrine therapy  4.  Chemo-induced peripheral neuropathy: Continue Cymbalta  5.  Hypokalemia: Potassium levels are normal today however patient is taking 60 mEq of oral potassium daily which she will continue on I will renew her medication  I will tentatively see her back in 6 weeks time with CBC with differential and CMP to see how she is tolerating her Xeloda   Visit Diagnosis 1. Malignant neoplasm of lower-inner quadrant of left breast in female, estrogen receptor negative ([place])   2. Chemotherapy-induced peripheral neuropathy ([place])   3. Hypokalemia   4. Goals of care, counseling/discussion      Dr. Randa Evens, MD, MPH [facility] [place] [phone] 11/28/2019 8:00 AM

## 2019-12-01 ENCOUNTER — Other Ambulatory Visit: Payer: BC Managed Care – PPO

## 2019-12-01 ENCOUNTER — Ambulatory Visit: Payer: BC Managed Care – PPO | Admitting: Oncology

## 2019-12-03 ENCOUNTER — Ambulatory Visit (INDEPENDENT_AMBULATORY_CARE_PROVIDER_SITE_OTHER): Payer: BC Managed Care – PPO | Admitting: Physician Assistant

## 2019-12-03 ENCOUNTER — Encounter: Payer: Self-pay | Admitting: Physician Assistant

## 2019-12-03 ENCOUNTER — Other Ambulatory Visit: Payer: Self-pay

## 2019-12-03 VITALS — BP 132/84 | HR 78 | Temp 98.9°F | Ht 66.0 in | Wt 158.0 lb

## 2019-12-03 DIAGNOSIS — Z09 Encounter for follow-up examination after completed treatment for conditions other than malignant neoplasm: Secondary | ICD-10-CM

## 2019-12-03 DIAGNOSIS — D0511 Intraductal carcinoma in situ of right breast: Secondary | ICD-10-CM

## 2019-12-03 DIAGNOSIS — C50312 Malignant neoplasm of lower-inner quadrant of left female breast: Secondary | ICD-10-CM

## 2019-12-03 NOTE — Progress Notes (Signed)
[facility] POST-OP OFFICE VISIT  12/03/2019  HPI: Molly Walters is a 64 y.o. female 22 days s/p Excision of Right Port-a-cath bilateral simple mastectomies, Bilateral sentinel node biopsies for triple negative invasive right breast cancer and DCIS on the left breast with Dr Dahlia Byes.   She had been seen by Dr Dahlia Byes in follow up and bilateral drains removed, however he was concerned she may be developing a seroma which was the reason for follow up today. She reports that everything has been going well at home. No reports of drainage or erythema. Intermittent pains which are mild in nature. Otherwise doing well  Of note, she has been followed by Dr Janese Banks (oncology) and was last seen on 08/12 and was recommended she start Xeloda. Initially, she thought she would want to take this medication but has changed her mind. She is due to follow up in September.    Vital signs: BP 132/84    Pulse 78    Temp 98.9 F (37.2 C)    Ht 5\' 6"  (1.676 m)    Wt 158 lb (71.7 kg)    SpO2 99%    BMI 25.50 kg/m    Physical Exam: Constitutional: Well appearing female, NAD Skin: Bilateral mastectomy incisions and port site in the right chest wall are all well healed, dermabond present, no erythema or drainage, no evidence of seroma present.   Assessment/Plan: This is a 64 y.o. female 22 days s/p Excision of Right Port-a-cath bilateral simple mastectomies, Bilateral sentinel node biopsies for triple negative invasive right breast cancer and DCIS on the left breast   - Wounds healing well, no evidence of infection, seroma, hematoma.  - Follow up with surgery prn  - Follow up with oncology as scheduled  -- Edison Simon, PA-C [facility] 12/03/2019, 11:02 AM [phone] M-F: 7am - 4pm

## 2019-12-03 NOTE — Patient Instructions (Signed)
Follow up here as needed. Continue your follow ups with Dr Janese Banks.   Call us if you develop any fevers or chills or bad smelling drainage. Your glue will start to fall off on its own.

## 2019-12-09 NOTE — Telephone Encounter (Signed)
Oral Oncology Patient Advocate Encounter  Prior Authorization for Xeloda has been approved.    PA# [id number]  Effective dates: 12/07/19 through 12/06/20  Patients co-pay is $0.00.  Oral Oncology Clinic will continue to follow.   [place] [facility] [place] Phone [phone] Fax [phone] 12/09/2019 8:29 AM

## 2019-12-18 ENCOUNTER — Telehealth: Payer: Self-pay | Admitting: Licensed Clinical Social Worker

## 2019-12-24 ENCOUNTER — Encounter: Payer: Self-pay | Admitting: Emergency Medicine

## 2019-12-24 ENCOUNTER — Telehealth: Payer: Self-pay | Admitting: *Deleted

## 2019-12-24 NOTE — Telephone Encounter (Signed)
Work note faxed to [employer].   Called patient and made her aware.

## 2019-12-24 NOTE — Telephone Encounter (Signed)
Patient called and wants a return back to work note stating that she can return on Monday September 13th and with what restrictions if any.   Fax to [phone] attention Archie Patten

## 2019-12-25 NOTE — Telephone Encounter (Signed)
Revealed negative genetic testing.   We discussed that we do not know why she has had breast cancer or why there is cancer in the family. It could be due to a different gene that we are not testing, or something our current technology cannot pick up.  It will be important for her to keep in contact with genetics to learn if additional testing may be needed in the future. ° °

## 2019-12-26 ENCOUNTER — Ambulatory Visit: Payer: Self-pay | Admitting: Licensed Clinical Social Worker

## 2019-12-26 ENCOUNTER — Encounter: Payer: Self-pay | Admitting: Licensed Clinical Social Worker

## 2019-12-26 DIAGNOSIS — Z809 Family history of malignant neoplasm, unspecified: Secondary | ICD-10-CM

## 2019-12-26 DIAGNOSIS — C50312 Malignant neoplasm of lower-inner quadrant of left female breast: Secondary | ICD-10-CM

## 2019-12-26 DIAGNOSIS — Z853 Personal history of malignant neoplasm of breast: Secondary | ICD-10-CM

## 2019-12-26 DIAGNOSIS — Z1379 Encounter for other screening for genetic and chromosomal anomalies: Secondary | ICD-10-CM | POA: Insufficient documentation

## 2019-12-26 NOTE — Progress Notes (Signed)
HPI:  Molly Walters was previously seen in the [facility] due to a personal history of cancer and concerns regarding a hereditary predisposition to cancer. Please refer to our prior cancer genetics clinic note for more information regarding our discussion, assessment and recommendations, at the time. Molly Walters recent genetic test results were disclosed to her, as were recommendations warranted by these results. These results and recommendations are discussed in more detail below.  CANCER HISTORY:  Oncology History  Malignant neoplasm of lower-inner quadrant of left breast in female, estrogen receptor negative ([place])  05/12/2019 Cancer Staging   Staging form: Breast, AJCC 8th Edition - Clinical stage from 05/12/2019: Stage IB (cT1c, cN0, cM0, G3, ER-, PR-, HER2-) - Signed by Sindy Guadeloupe, MD on 05/13/2019   05/13/2019 Initial Diagnosis   Malignant neoplasm of lower-inner quadrant of left breast in female, estrogen receptor negative ([place])   05/22/2019 -  Chemotherapy   The patient had dexamethasone (DECADRON) 4 MG tablet, 1 of 1 cycle, Start date: 08/06/2019, End date: -- DOXOrubicin (ADRIAMYCIN) chemo injection 112 mg, 60 mg/m2 = 112 mg, Intravenous,  Once, 4 of 4 cycles Administration: 112 mg (05/22/2019), 112 mg (06/05/2019), 112 mg (06/19/2019), 112 mg (07/03/2019) palonosetron (ALOXI) injection 0.25 mg, 0.25 mg, Intravenous,  Once, 4 of 4 cycles Administration: 0.25 mg (05/22/2019), 0.25 mg (06/05/2019), 0.25 mg (06/19/2019), 0.25 mg (07/03/2019) pegfilgrastim-cbqv (UDENYCA) injection 6 mg, 6 mg, Subcutaneous, Once, 4 of 4 cycles Administration: 6 mg (05/23/2019), 6 mg (06/06/2019), 6 mg (06/20/2019), 6 mg (07/04/2019) CARBOplatin (PARAPLATIN) 160 mg in sodium chloride 0.9 % 100 mL chemo infusion, 160 mg (100 % of original dose 162.9 mg), Intravenous,  Once, 12 of 12 cycles Dose modification:   (original dose 162.9 mg, Cycle 6) Administration: 160 mg (07/17/2019), 160 mg (07/24/2019), 160 mg  (07/31/2019), 160 mg (08/08/2019), 160 mg (08/21/2019), 160 mg (08/28/2019), 160 mg (09/04/2019), 160 mg (09/11/2019), 160 mg (09/18/2019), 160 mg (10/02/2019), 160 mg (10/09/2019), 160 mg (10/17/2019) cyclophosphamide (CYTOXAN) 1,120 mg in sodium chloride 0.9 % 250 mL chemo infusion, 600 mg/m2 = 1,120 mg, Intravenous,  Once, 4 of 4 cycles Administration: 1,120 mg (05/22/2019), 1,120 mg (06/05/2019), 1,120 mg (06/19/2019), 1,120 mg (07/03/2019) PACLitaxel (TAXOL) 150 mg in sodium chloride 0.9 % 250 mL chemo infusion (</= 49m/m2), 80 mg/m2 = 150 mg, Intravenous,  Once, 12 of 12 cycles Dose modification: 65 mg/m2 (original dose 80 mg/m2, Cycle 14, Reason: Other (see comments), Comment: neuropathy) Administration: 150 mg (07/17/2019), 150 mg (07/24/2019), 150 mg (07/31/2019), 150 mg (08/08/2019), 150 mg (08/21/2019), 150 mg (08/28/2019), 150 mg (09/04/2019), 120 mg (09/18/2019), 120 mg (10/02/2019), 120 mg (10/09/2019), 120 mg (10/17/2019) fosaprepitant (EMEND) 150 mg in sodium chloride 0.9 % 145 mL IVPB, 150 mg, Intravenous,  Once, 4 of 4 cycles Administration: 150 mg (05/22/2019), 150 mg (06/05/2019), 150 mg (06/19/2019), 150 mg (07/03/2019)  for chemotherapy treatment.    12/18/2019 Genetic Testing   Negative genetic testing. No pathogenic variants identified on the [facility] Common Hereditary Cancers Panel. The report date is 12/18/2019.  The Common Hereditary Cancers Panel offered by [facility] includes sequencing and/or deletion duplication testing of the following 48 genes: APC, ATM, AXIN2, BARD1, BMPR1A, BRCA1, BRCA2, BRIP1, CDH1, CDKN2A (p14ARF), CDKN2A (p16INK4a), CKD4, CHEK2, CTNNA1, DICER1, EPCAM (Deletion/duplication testing only), GREM1 (promoter region deletion/duplication testing only), KIT, MEN1, MLH1, MSH2, MSH3, MSH6, MUTYH, NBN, NF1, NHTL1, PALB2, PDGFRA, PMS2, POLD1, POLE, PTEN, RAD50, RAD51C, RAD51D, RNF43, SDHB, SDHC, SDHD, SMAD4, SMARCA4. STK11, TP53, TSC1, TSC2, and VHL.  The following genes were evaluated for sequence changes  only: SDHA and HOXB13 c.251G>A variant only.     FAMILY HISTORY:  We obtained a detailed, 4-generation family history.  Significant diagnoses are listed below: Family History  Problem Relation Age of Onset  . Hypertension Mother   . Cancer Maternal Grandmother   . Leukemia Paternal Grandmother   . Cancer Maternal Aunt        unk type  . Breast cancer Neg Hx     Molly Walters has 1 son, 37. She has 1 sister, 46 and 3 brothers, no cancers.   Molly Walters mother died at 110. Patient had 4 maternal aunts and 2 maternal uncles. One aunt did have cancer, unsure of the type. No known cancers in first cousins. She does note a maternal second cousin had breast cancer. Maternal grandmother also had cancer, unknown type. Grandfather died over 71.   Molly Walters father died at 51. Patient had 4 paternal uncles, 2 aunts, no cancers. No known cancers in cousins. Paternal grandmother had leukemia.   Molly Walters is unaware of previous family history of genetic testing for hereditary cancer risks. Patient's maternal ancestors are of [place] American descent, and paternal ancestors are of [place] and [place] descent. There is no reported Ashkenazi Jewish ancestry. There is no known consanguinity.  GENETIC TEST RESULTS: Genetic testing reported out on 12/18/2019 through the [facility] Common Hereditary cancer panel found no pathogenic mutations.   The Common Hereditary Cancers Panel offered by [facility] includes sequencing and/or deletion duplication testing of the following 48 genes: APC, ATM, AXIN2, BARD1, BMPR1A, BRCA1, BRCA2, BRIP1, CDH1, CDKN2A (p14ARF), CDKN2A (p16INK4a), CKD4, CHEK2, CTNNA1, DICER1, EPCAM (Deletion/duplication testing only), GREM1 (promoter region deletion/duplication testing only), KIT, MEN1, MLH1, MSH2, MSH3, MSH6, MUTYH, NBN, NF1, NHTL1, PALB2, PDGFRA, PMS2, POLD1, POLE, PTEN, RAD50, RAD51C, RAD51D, RNF43, SDHB, SDHC, SDHD, SMAD4, SMARCA4. STK11, TP53, TSC1, TSC2, and VHL.  The  following genes were evaluated for sequence changes only: SDHA and HOXB13 c.251G>A variant only.   The test report has been scanned into EPIC and is located under the Molecular Pathology section of the Results Review tab.  A portion of the result report is included below for reference.     We discussed with Molly Walters that because current genetic testing is not perfect, it is possible there may be a gene mutation in one of these genes that current testing cannot detect, but that chance is small.  We also discussed, that there could be another gene that has not yet been discovered, or that we have not yet tested, that is responsible for the cancer diagnoses in the family. It is also possible there is a hereditary cause for the cancer in the family that Molly Walters did not inherit and therefore was not identified in her testing.  Therefore, it is important to remain in touch with cancer genetics in the future so that we can continue to offer Molly Walters the most up to date genetic testing.   ADDITIONAL GENETIC TESTING: We discussed with Molly Walters that her genetic testing was fairly extensive.  If there are genes identified to increase cancer risk that can be analyzed in the future, we would be happy to discuss and coordinate this testing at that time.    CANCER SCREENING RECOMMENDATIONS: Molly Walters test result is considered negative (normal).  This means that we have not identified a hereditary cause for her personal and family history of cancer at this time.   While  reassuring, this does not definitively rule out a hereditary predisposition to cancer. It is still possible that there could be genetic mutations that are undetectable by current technology. There could be genetic mutations in genes that have not been tested or identified to increase cancer risk.  Therefore, it is recommended she continue to follow the cancer management and screening guidelines provided by her oncology and primary healthcare provider.     An individual's cancer risk and medical management are not determined by genetic test results alone. Overall cancer risk assessment incorporates additional factors, including personal medical history, family history, and any available genetic information that may result in a personalized plan for cancer prevention and surveillance.  RECOMMENDATIONS FOR FAMILY MEMBERS:  Relatives in this family might be at some increased risk of developing cancer, over the general population risk, simply due to the family history of cancer.  We recommended female relatives in this family have a yearly mammogram beginning at age 34, or 66 years younger than the earliest onset of cancer, an annual clinical breast exam, and perform monthly breast self-exams. Female relatives in this family should also have a gynecological exam as recommended by their primary provider.  All family members should be referred for colonoscopy starting at age 61.   FOLLOW-UP: Lastly, we discussed with Molly Walters that cancer genetics is a rapidly advancing field and it is possible that new genetic tests will be appropriate for her and/or her family members in the future. We encouraged her to remain in contact with cancer genetics on an annual basis so we can update her personal and family histories and let her know of advances in cancer genetics that may benefit this family.   Our contact number was provided. Molly Walters questions were answered to her satisfaction, and she knows she is welcome to call us at anytime with additional questions or concerns.   Faith Rogue, MS, [facility] Genetic Counselor [email or web address] Phone: [phone]

## 2020-01-08 ENCOUNTER — Inpatient Hospital Stay (HOSPITAL_BASED_OUTPATIENT_CLINIC_OR_DEPARTMENT_OTHER): Payer: BC Managed Care – PPO | Admitting: Oncology

## 2020-01-08 ENCOUNTER — Encounter: Payer: Self-pay | Admitting: Oncology

## 2020-01-08 ENCOUNTER — Other Ambulatory Visit: Payer: Self-pay

## 2020-01-08 ENCOUNTER — Inpatient Hospital Stay: Payer: BC Managed Care – PPO | Attending: Oncology

## 2020-01-08 VITALS — BP 142/93 | HR 91 | Temp 96.8°F | Resp 16 | Wt 157.5 lb

## 2020-01-08 DIAGNOSIS — Z08 Encounter for follow-up examination after completed treatment for malignant neoplasm: Secondary | ICD-10-CM | POA: Diagnosis not present

## 2020-01-08 DIAGNOSIS — G620 Drug-induced polyneuropathy: Secondary | ICD-10-CM

## 2020-01-08 DIAGNOSIS — C50912 Malignant neoplasm of unspecified site of left female breast: Secondary | ICD-10-CM | POA: Diagnosis not present

## 2020-01-08 DIAGNOSIS — I10 Essential (primary) hypertension: Secondary | ICD-10-CM | POA: Insufficient documentation

## 2020-01-08 DIAGNOSIS — Z791 Long term (current) use of non-steroidal anti-inflammatories (NSAID): Secondary | ICD-10-CM | POA: Diagnosis not present

## 2020-01-08 DIAGNOSIS — T451X5A Adverse effect of antineoplastic and immunosuppressive drugs, initial encounter: Secondary | ICD-10-CM | POA: Insufficient documentation

## 2020-01-08 DIAGNOSIS — K219 Gastro-esophageal reflux disease without esophagitis: Secondary | ICD-10-CM | POA: Diagnosis not present

## 2020-01-08 DIAGNOSIS — R5383 Other fatigue: Secondary | ICD-10-CM | POA: Insufficient documentation

## 2020-01-08 DIAGNOSIS — R5381 Other malaise: Secondary | ICD-10-CM | POA: Diagnosis not present

## 2020-01-08 DIAGNOSIS — Z87891 Personal history of nicotine dependence: Secondary | ICD-10-CM | POA: Diagnosis not present

## 2020-01-08 DIAGNOSIS — D6481 Anemia due to antineoplastic chemotherapy: Secondary | ICD-10-CM | POA: Insufficient documentation

## 2020-01-08 DIAGNOSIS — Z9013 Acquired absence of bilateral breasts and nipples: Secondary | ICD-10-CM | POA: Diagnosis not present

## 2020-01-08 DIAGNOSIS — Z923 Personal history of irradiation: Secondary | ICD-10-CM | POA: Insufficient documentation

## 2020-01-08 DIAGNOSIS — Z9221 Personal history of antineoplastic chemotherapy: Secondary | ICD-10-CM | POA: Diagnosis not present

## 2020-01-08 DIAGNOSIS — Z171 Estrogen receptor negative status [ER-]: Secondary | ICD-10-CM | POA: Insufficient documentation

## 2020-01-08 DIAGNOSIS — Z853 Personal history of malignant neoplasm of breast: Secondary | ICD-10-CM

## 2020-01-08 DIAGNOSIS — Z79899 Other long term (current) drug therapy: Secondary | ICD-10-CM | POA: Insufficient documentation

## 2020-01-08 LAB — COMPREHENSIVE METABOLIC PANEL
ALT: 21 U/L (ref 0–44)
AST: 23 U/L (ref 15–41)
Albumin: 4.2 g/dL (ref 3.5–5.0)
Alkaline Phosphatase: 75 U/L (ref 38–126)
Anion gap: 10 (ref 5–15)
BUN: 11 mg/dL (ref 8–23)
CO2: 22 mmol/L (ref 22–32)
Calcium: 9.6 mg/dL (ref 8.9–10.3)
Chloride: 103 mmol/L (ref 98–111)
Creatinine, Ser: 0.89 mg/dL (ref 0.44–1.00)
GFR calc Af Amer: >60 mL/min (ref >60)
GFR calc non Af Amer: >60 mL/min (ref >60)
Glucose, Bld: 122 mg/dL — ABNORMAL HIGH (ref 70–99)
Potassium: 3.1 mmol/L — ABNORMAL LOW (ref 3.5–5.1)
Sodium: 135 mmol/L (ref 135–145)
Total Bilirubin: 0.6 mg/dL (ref 0.3–1.2)
Total Protein: 7.6 g/dL (ref 6.5–8.1)

## 2020-01-08 LAB — CBC WITH DIFFERENTIAL/PLATELET
Abs Immature Granulocytes: 0.01 10*3/uL (ref 0.00–0.07)
Basophils Absolute: 0 10*3/uL (ref 0.0–0.1)
Basophils Relative: 0 %
Eosinophils Absolute: 0.1 10*3/uL (ref 0.0–0.5)
Eosinophils Relative: 1 %
HCT: 35.1 % — ABNORMAL LOW (ref 36.0–46.0)
Hemoglobin: 11.6 g/dL — ABNORMAL LOW (ref 12.0–15.0)
Immature Granulocytes: 0 %
Lymphocytes Relative: 35 %
Lymphs Abs: 1.9 10*3/uL (ref 0.7–4.0)
MCH: 27.4 pg (ref 26.0–34.0)
MCHC: 33 g/dL (ref 30.0–36.0)
MCV: 82.8 fL (ref 80.0–100.0)
Monocytes Absolute: 0.6 10*3/uL (ref 0.1–1.0)
Monocytes Relative: 11 %
Neutro Abs: 2.8 10*3/uL (ref 1.7–7.7)
Neutrophils Relative %: 53 %
Platelets: 265 10*3/uL (ref 150–400)
RBC: 4.24 MIL/uL (ref 3.87–5.11)
RDW: 14.9 % (ref 11.5–15.5)
WBC: 5.3 10*3/uL (ref 4.0–10.5)
nRBC: 0 % (ref 0.0–0.2)

## 2020-01-11 NOTE — Progress Notes (Signed)
Hematology/Oncology Consult note [facility]  [phone] [phone]  Patient Care Team: Lanny Hurst, MD as PCP - General (Internal Medicine) Rico Junker, RN as Registered Nurse Theodore Demark, RN as Registered Nurse Sindy Guadeloupe, MD as Consulting Physician (Oncology)   Name of the patient: Molly Walters  [id number]  October 08, 1955   Date of visit: 01/11/20  Diagnosis- clinical prognostic stage Ia invasive mammary carcinoma cmT1 ccN0 cM0 ER/PR negative and HER-2/neu negative  Chief complaint/ Reason for visit- discuss start of adjuvant xeloda  Heme/Onc history: patient is a 64 year old African-American female with past medical history significant for hypertension. She has had prior breast cancer in 2001 s/p lumpectomy followed by adjuvant radiation treatment. She did not require adjuvant chemotherapy. She does not remember taking any endocrine therapy as well.More recently patient underwent a bilateral screening mammogram in November 2020 which showed a possible mass in the left breast. This was followed by a diagnostic mammogram and ultrasound which showed a 1.6 cm mass at the 7 o'clock position in the left breast near the lumpectomy scar. No left axillary adenopathy. 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   MRI bilateral breast showed:The size of the primary left breast mass appears larger on MRI close to 2.4 x 1.9 x 1.7 cm. She is also noted to have 2 additional enhancing masses 9 x 6 x 10 mm and 5 x 5 x 5 mm in the left breast with suspicious washout kinetics. No pathological left axillary adenopathy. Prominent lymph nodes seen on the right side along with non-mass enhancement spanning 5.3 cm in the AP dimension.Both the enhancing masses in the left breast were positive for triple negative breast cancer. Right sided axillary lymph node was negative for  malignancy.Right breast biopsy showed DCIS  Patient has completed neoadjuvant dose dense AC chemotherapyand 12 weekly cycles of carbotaxol chemotherapy on 10/16/2019.   Patient underwent bilateral mastectomy without reconstruction.  Residual 10 mm of carcinoma was seen in the left breast.  No evidence of malignancy in the right breast.  No evidence of residual in situ carcinoma in the right breast.  One sentinel lymph node on the left and three sentinel lymph nodes on the right were negative for malignancy.  Overall cancer cellularity was 10%  genetic testing negative  Interval history- she recently had her cataract surgery in one eye. Feeling gradually better. Energy levels are better but not back to baseline. Neuropathy is stable  ECOG PS- 1 Pain scale- 0   Review of systems- Review of Systems  Constitutional: Positive for malaise/fatigue. Negative for chills, fever and weight loss.  HENT: Negative for congestion, ear discharge and nosebleeds.   Eyes: Negative for blurred vision.  Respiratory: Negative for cough, hemoptysis, sputum production, shortness of breath and wheezing.   Cardiovascular: Negative for chest pain, palpitations, orthopnea and claudication.  Gastrointestinal: Negative for abdominal pain, blood in stool, constipation, diarrhea, heartburn, melena, nausea and vomiting.  Genitourinary: Negative for dysuria, flank pain, frequency, hematuria and urgency.  Musculoskeletal: Negative for back pain, joint pain and myalgias.  Skin: Negative for rash.  Neurological: Positive for sensory change (peripheral neuropathy). Negative for dizziness, tingling, focal weakness, seizures, weakness and headaches.  Endo/Heme/Allergies: Does not bruise/bleed easily.  Psychiatric/Behavioral: Negative for depression and suicidal ideas. The patient does not have insomnia.        No Known Allergies   Past Medical History:  Diagnosis Date  .  Breast cancer ([place]) 2001   left breast  lumpectomy and rad tx  . Breast cancer ([place]) 02/2019   left breast mass w/ biopsy  . Breast cancer ([place]) 2021   right DCIS  . Family history of cancer   . GERD (gastroesophageal reflux disease)   . History of breast cancer   . Hypertension   . Personal history of radiation therapy 2001   BREAST CA     Past Surgical History:  Procedure Laterality Date  . ABDOMINAL HYSTERECTOMY    . BREAST BIOPSY Left 2001   POS  . BREAST BIOPSY Left 2021   positive  . BREAST BIOPSY Right 2021   DCIS  . BREAST LUMPECTOMY Left 2001  . PORT-A-CATH REMOVAL N/A 11/11/2019   Procedure: REMOVAL PORT-A-CATH;  Surgeon: Jules Husbands, MD;  Location: [facility] ORS;  Service: General;  Laterality: N/A;  . PORTACATH PLACEMENT Right 05/16/2019   Procedure: INSERTION PORT-A-CATH;  Surgeon: Jules Husbands, MD;  Location: [facility] ORS;  Service: General;  Laterality: Right;  . SIMPLE MASTECTOMY WITH AXILLARY SENTINEL NODE BIOPSY Bilateral 11/11/2019   Procedure: SIMPLE MASTECTOMY WITH AXILLARY SENTINEL NODE BIOPSY;  Surgeon: Jules Husbands, MD;  Location: [facility] ORS;  Service: General;  Laterality: Bilateral;    Social History   Socioeconomic History  . Marital status: Divorced    Spouse name: Not on file  . Number of children: Not on file  . Years of education: Not on file  . Highest education level: Not on file  Occupational History  . Not on file  Tobacco Use  . Smoking status: Former Smoker    Packs/day: 0.50    Years: 5.00    Pack years: 2.50    Types: Cigarettes    Quit date: 1970    Years since quitting: 51.7  . Smokeless tobacco: Never Used  Vaping Use  . Vaping Use: Never used  Substance and Sexual Activity  . Alcohol use: Never  . Drug use: Never  . Sexual activity: Not Currently  Other Topics Concern  . Not on file  Social History Narrative  . Not on file   Social Determinants of Health   Financial Resource Strain:   . Difficulty of Paying Living Expenses: Not on file  Food Insecurity:     . Worried About Charity fundraiser in the Last Year: Not on file  . Ran Out of Food in the Last Year: Not on file  Transportation Needs:   . Lack of Transportation (Medical): Not on file  . Lack of Transportation (Non-Medical): Not on file  Physical Activity:   . Days of Exercise per Week: Not on file  . Minutes of Exercise per Session: Not on file  Stress:   . Feeling of Stress : Not on file  Social Connections:   . Frequency of Communication with Friends and Family: Not on file  . Frequency of Social Gatherings with Friends and Family: Not on file  . Attends Religious Services: Not on file  . Active Member of Clubs or Organizations: Not on file  . Attends Archivist Meetings: Not on file  . Marital Status: Not on file  Intimate Partner Violence:   . Fear of Current or Ex-Partner: Not on file  . Emotionally Abused: Not on file  . Physically Abused: Not on file  . Sexually Abused: Not on file    Family History  Problem Relation Age of Onset  . Hypertension Mother   . Cancer Maternal  Grandmother   . Leukemia Paternal Grandmother   . Cancer Maternal Aunt        unk type  . Breast cancer Neg Hx      Current Outpatient Medications:  .  acetaminophen (TYLENOL) 500 MG tablet, Take 500 mg by mouth every 6 (six) hours as needed., Disp: , Rfl:  .  cholecalciferol (VITAMIN D3) 25 MCG (1000 UNIT) tablet, Take 1,000 Units by mouth daily., Disp: , Rfl:  .  dorzolamide-timolol (COSOPT) 22.3-6.8 MG/ML ophthalmic solution, Administer 1 drop to both eyes Two (2) times a day., Disp: , Rfl:  .  DULoxetine (CYMBALTA) 30 MG capsule, Take 1 capsule (30 mg total) by mouth daily. Increase to 60 mg daily after 1 week, Disp: 60 capsule, Rfl: 3 .  hydrochlorothiazide (HYDRODIURIL) 25 MG tablet, Take 25 mg by mouth daily., Disp: , Rfl:  .  ibuprofen (ADVIL) 800 MG tablet, Take 1 tablet (800 mg total) by mouth every 8 (eight) hours as needed., Disp: 30 tablet, Rfl: 0 .  latanoprost  (XALATAN) 0.005 % ophthalmic solution, Place 1 drop into both eyes at bedtime. , Disp: , Rfl:  .  loperamide (IMODIUM A-D) 2 MG tablet, Take 2 mg by mouth 4 (four) times daily as needed for diarrhea or loose stools., Disp: , Rfl:  .  LORazepam (ATIVAN) 0.5 MG tablet, Take 1 tablet (0.5 mg total) by mouth every 6 (six) hours as needed (Nausea or vomiting)., Disp: 30 tablet, Rfl: 0 .  losartan (COZAAR) 100 MG tablet, Take 100 mg by mouth daily., Disp: , Rfl:  .  ondansetron (ZOFRAN) 8 MG tablet, Take 1 tablet (8 mg total) by mouth 2 (two) times daily as needed. Start on the third day after chemotherapy., Disp: 30 tablet, Rfl: 1 .  oxyCODONE (OXY IR/ROXICODONE) 5 MG immediate release tablet, Take 1 tablet (5 mg total) by mouth every 4 (four) hours as needed for moderate pain, severe pain or breakthrough pain., Disp: 30 tablet, Rfl: 0 .  pantoprazole (PROTONIX) 40 MG tablet, Take 1 tablet (40 mg total) by mouth daily., Disp: 30 tablet, Rfl: 2 .  potassium chloride SA (KLOR-CON) 20 MEQ tablet, Take 1 tablet (20 mEq total) by mouth 3 (three) times daily., Disp: 90 tablet, Rfl: 3 .  prednisoLONE acetate (PRED FORTE) 1 % ophthalmic suspension, Administer 1 drop to the right eye Four (4) times a day. SHAKE FOR 1 MINUTE, Disp: , Rfl:  .  prochlorperazine (COMPAZINE) 10 MG tablet, Take 1 tablet (10 mg total) by mouth every 6 (six) hours as needed (Nausea or vomiting)., Disp: 30 tablet, Rfl: 1 .  timolol (TIMOPTIC) 0.5 % ophthalmic solution, Place 1 drop into both eyes daily. , Disp: , Rfl:  No current facility-administered medications for this visit.  Facility-Administered Medications Ordered in Other Visits:  .  sodium chloride flush (NS) 0.9 % injection 10 mL, 10 mL, Intravenous, PRN, Sindy Guadeloupe, MD, 10 mL at 10/09/19 0815  Physical exam:  Vitals:   01/08/20 1437  BP: (!) 142/93  Pulse: 91  Resp: 16  Temp: (!) 96.8 F (36 C)  TempSrc: Tympanic  SpO2: 100%  Weight: 157 lb 8 oz (71.4 kg)    Physical Exam Constitutional:      General: She is not in acute distress. Cardiovascular:     Rate and Rhythm: Normal rate and regular rhythm.     Heart sounds: Normal heart sounds.  Pulmonary:     Effort: Pulmonary effort is normal.  Breath sounds: Normal breath sounds.  Abdominal:     General: Bowel sounds are normal.     Palpations: Abdomen is soft.  Skin:    General: Skin is warm and dry.  Neurological:     Mental Status: She is alert and oriented to person, place, and time.      CMP Latest Ref Rng & Units 01/08/2020  Glucose 70 - 99 mg/dL 122(H)  BUN 8 - 23 mg/dL 11  Creatinine 0.44 - 1.00 mg/dL 0.89  Sodium 135 - 145 mmol/L 135  Potassium 3.5 - 5.1 mmol/L 3.1(L)  Chloride 98 - 111 mmol/L 103  CO2 22 - 32 mmol/L 22  Calcium 8.9 - 10.3 mg/dL 9.6  Total Protein 6.5 - 8.1 g/dL 7.6  Total Bilirubin 0.3 - 1.2 mg/dL 0.6  Alkaline Phos 38 - 126 U/L 75  AST 15 - 41 U/L 23  ALT 0 - 44 U/L 21   CBC Latest Ref Rng & Units 01/08/2020  WBC 4.0 - 10.5 K/uL 5.3  Hemoglobin 12.0 - 15.0 g/dL 11.6(L)  Hematocrit 36 - 46 % 35.1(L)  Platelets 150 - 400 K/uL 265    Assessment and plan- Patient is a 64 y.o. female clinical prognostic stage Ia invasive mammary carcinomamcT1 ccN0 cM0 ER/PR negative and HER-2/neu negative.She is s/p neoadjuvant dose dense AC chemotherapy followed by 12 weekly cycles of carbotaxol chemotherapy.  she is here to discuss start of adjuvant xeloda.   Genetic testing was negative. She therefore does not qualify for adjuvant olaparib. She had residual disease after neoadjuvant chemotherapy and therefore adjuvant xeloda for 6 months is a reasonable consideration per [employer]- X trial. I explained to the patient the risks and benefits of chemotherapy including all but not limited to nausea, vomiting, diarrhea, skin rash,low blood counts and risk of infection and hospitalization. Treatment will be given with a curative intent. Patient would like to think about it  this week and let us know which way she wants to proceed.  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 She does not take cymbalta  I will tentatively see her in 3 months     Visit Diagnosis 1. Encounter for follow-up surveillance of breast cancer   2. Chemotherapy-induced peripheral neuropathy ([place])   3. Anemia due to antineoplastic chemotherapy      Dr. Randa Evens, MD, MPH [facility] [phone] 01/11/2020 7:13 PM

## 2020-01-16 ENCOUNTER — Telehealth: Payer: Self-pay | Admitting: *Deleted

## 2020-01-16 NOTE — Telephone Encounter (Signed)
Called patient to see if she had enough time to talk about whether she wanted to start Xeloda.  Patient states that she had her family member read all the side effects to her and she feels like that it is too many side effects that she does not want to take the drug.  I told her that I will let Dr. Janese Banks know and then we will make a new appointment for her for follow-up for the future and mail it to her.  Patient is agreeable to plan

## 2020-01-16 NOTE — Telephone Encounter (Signed)
3 months cbc with diff and cmp and see me in person

## 2020-01-19 NOTE — Telephone Encounter (Signed)
I have sent a message to Nira Conn to make her an appt 3 months from now with labs. I had told the pt that we will call her with appt but put it in the mail for her as a reminder. She likes paper appt.

## 2020-02-26 ENCOUNTER — Other Ambulatory Visit: Payer: Self-pay | Admitting: Oncology

## 2020-04-22 ENCOUNTER — Other Ambulatory Visit: Payer: Self-pay

## 2020-04-22 ENCOUNTER — Encounter: Payer: Self-pay | Admitting: Oncology

## 2020-04-22 ENCOUNTER — Inpatient Hospital Stay: Payer: BC Managed Care – PPO | Attending: Oncology

## 2020-04-22 ENCOUNTER — Inpatient Hospital Stay (HOSPITAL_BASED_OUTPATIENT_CLINIC_OR_DEPARTMENT_OTHER): Payer: BC Managed Care – PPO | Admitting: Oncology

## 2020-04-22 VITALS — BP 157/82 | HR 76 | Temp 98.2°F | Wt 169.7 lb

## 2020-04-22 DIAGNOSIS — Z923 Personal history of irradiation: Secondary | ICD-10-CM | POA: Insufficient documentation

## 2020-04-22 DIAGNOSIS — I10 Essential (primary) hypertension: Secondary | ICD-10-CM | POA: Diagnosis not present

## 2020-04-22 DIAGNOSIS — Z87891 Personal history of nicotine dependence: Secondary | ICD-10-CM | POA: Diagnosis not present

## 2020-04-22 DIAGNOSIS — D6481 Anemia due to antineoplastic chemotherapy: Secondary | ICD-10-CM | POA: Diagnosis not present

## 2020-04-22 DIAGNOSIS — K219 Gastro-esophageal reflux disease without esophagitis: Secondary | ICD-10-CM | POA: Insufficient documentation

## 2020-04-22 DIAGNOSIS — Z08 Encounter for follow-up examination after completed treatment for malignant neoplasm: Secondary | ICD-10-CM | POA: Diagnosis not present

## 2020-04-22 DIAGNOSIS — Z171 Estrogen receptor negative status [ER-]: Secondary | ICD-10-CM | POA: Insufficient documentation

## 2020-04-22 DIAGNOSIS — R5381 Other malaise: Secondary | ICD-10-CM | POA: Insufficient documentation

## 2020-04-22 DIAGNOSIS — Z853 Personal history of malignant neoplasm of breast: Secondary | ICD-10-CM | POA: Diagnosis not present

## 2020-04-22 DIAGNOSIS — T451X5A Adverse effect of antineoplastic and immunosuppressive drugs, initial encounter: Secondary | ICD-10-CM

## 2020-04-22 DIAGNOSIS — R5383 Other fatigue: Secondary | ICD-10-CM | POA: Diagnosis not present

## 2020-04-22 DIAGNOSIS — Z9013 Acquired absence of bilateral breasts and nipples: Secondary | ICD-10-CM | POA: Diagnosis not present

## 2020-04-22 DIAGNOSIS — C50312 Malignant neoplasm of lower-inner quadrant of left female breast: Secondary | ICD-10-CM

## 2020-04-22 DIAGNOSIS — Z9221 Personal history of antineoplastic chemotherapy: Secondary | ICD-10-CM | POA: Diagnosis not present

## 2020-04-22 DIAGNOSIS — Z79899 Other long term (current) drug therapy: Secondary | ICD-10-CM | POA: Diagnosis not present

## 2020-04-22 DIAGNOSIS — G629 Polyneuropathy, unspecified: Secondary | ICD-10-CM | POA: Diagnosis not present

## 2020-04-22 DIAGNOSIS — Z791 Long term (current) use of non-steroidal anti-inflammatories (NSAID): Secondary | ICD-10-CM | POA: Insufficient documentation

## 2020-04-22 LAB — COMPREHENSIVE METABOLIC PANEL WITH GFR
ALT: 36 U/L (ref 0–44)
AST: 23 U/L (ref 15–41)
Albumin: 4.2 g/dL (ref 3.5–5.0)
Alkaline Phosphatase: 95 U/L (ref 38–126)
Anion gap: 10 (ref 5–15)
BUN: 16 mg/dL (ref 8–23)
CO2: 24 mmol/L (ref 22–32)
Calcium: 9.5 mg/dL (ref 8.9–10.3)
Chloride: 100 mmol/L (ref 98–111)
Creatinine, Ser: 0.85 mg/dL (ref 0.44–1.00)
GFR, Estimated: >60 mL/min (ref >60)
Glucose, Bld: 100 mg/dL — ABNORMAL HIGH (ref 70–99)
Potassium: 3.3 mmol/L — ABNORMAL LOW (ref 3.5–5.1)
Sodium: 134 mmol/L — ABNORMAL LOW (ref 135–145)
Total Bilirubin: 0.9 mg/dL (ref 0.3–1.2)
Total Protein: 7.6 g/dL (ref 6.5–8.1)

## 2020-04-22 LAB — CBC WITH DIFFERENTIAL/PLATELET
Abs Immature Granulocytes: 0.02 K/uL (ref 0.00–0.07)
Basophils Absolute: 0 K/uL (ref 0.0–0.1)
Basophils Relative: 0 %
Eosinophils Absolute: 0.1 K/uL (ref 0.0–0.5)
Eosinophils Relative: 1 %
HCT: 35 % — ABNORMAL LOW (ref 36.0–46.0)
Hemoglobin: 11.9 g/dL — ABNORMAL LOW (ref 12.0–15.0)
Immature Granulocytes: 0 %
Lymphocytes Relative: 42 %
Lymphs Abs: 2.2 K/uL (ref 0.7–4.0)
MCH: 27.8 pg (ref 26.0–34.0)
MCHC: 34 g/dL (ref 30.0–36.0)
MCV: 81.8 fL (ref 80.0–100.0)
Monocytes Absolute: 0.6 K/uL (ref 0.1–1.0)
Monocytes Relative: 13 %
Neutro Abs: 2.2 K/uL (ref 1.7–7.7)
Neutrophils Relative %: 44 %
Platelets: 242 K/uL (ref 150–400)
RBC: 4.28 MIL/uL (ref 3.87–5.11)
RDW: 14.3 % (ref 11.5–15.5)
WBC: 5.1 K/uL (ref 4.0–10.5)
nRBC: 0 % (ref 0.0–0.2)

## 2020-04-22 NOTE — Progress Notes (Signed)
No new concerns or issues to discuss today

## 2020-04-23 NOTE — Progress Notes (Signed)
Hematology/Oncology Consult note [facility]  Telephone:([phone] [phone]  Patient Care Team: Lanny Hurst, MD as PCP - General (Internal Medicine) Rico Junker, RN as Registered Nurse Theodore Demark, RN as Registered Nurse Sindy Guadeloupe, MD as Consulting Physician (Oncology)   Name of the patient: Molly Walters  [id number]  October 19, 1955   Date of visit: 04/23/20  Diagnosis- clinical prognostic stage Ia invasive mammary carcinoma cmT1 ccN0 cM0 ER/PR negative and HER-2/neu negative s/p neoadjuvant chemotherapy and bilateral mastectomy  Chief complaint/ Reason for visit-routine follow-up of breast cancer  Heme/Onc history: patient is a 65 year old African-American female with past medical history significant for hypertension. She has had prior breast cancer in 2001 s/p lumpectomy followed by adjuvant radiation treatment. She did not require adjuvant chemotherapy. She does not remember taking any endocrine therapy as well.More recently patient underwent a bilateral screening mammogram in November 2020 which showed a possible mass in the left breast. This was followed by a diagnostic mammogram and ultrasound which showed a 1.6 cm mass at the 7 o'clock position in the left breast near the lumpectomy scar. No left axillary adenopathy. 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   MRI bilateral breast showed:The size of the primary left breast mass appears larger on MRI close to 2.4 x 1.9 x 1.7 cm. She is also noted to have 2 additional enhancing masses 9 x 6 x 10 mm and 5 x 5 x 5 mm in the left breast with suspicious washout kinetics. No pathological left axillary adenopathy. Prominent lymph nodes seen on the right side along with non-mass enhancement spanning 5.3 cm in the AP dimension.Both the enhancing masses in the left breast were positive for triple negative breast  cancer. Right sided axillary lymph node was negative for malignancy.Right breast biopsy showed DCIS  Patient has completed neoadjuvant dose dense AC chemotherapyand 12 weekly cycles of carbotaxol chemotherapy on 10/16/2019.Patient underwent bilateral mastectomy without reconstruction. Residual 10 mm of carcinoma was seen in the left breast. No evidence of malignancy in the right breast. No evidence of residual in situ carcinoma in the right breast. One sentinel lymph node on the left and three sentinel lymph nodes on the right were negative for malignancy. Overall cancer cellularity was 10%.  Adjuvant Xeloda was offered and patient declined it.  genetic testing negative.    Interval history-she is awaiting to undergo cataract surgery to her left eye.  She is ambulating with a cane given her right knee issues.  Energy levels are getting better.  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 Negative for chills, fever and weight loss.  HENT: Negative for congestion, ear discharge and nosebleeds.   Eyes: Negative for blurred vision.  Respiratory: Negative for cough, hemoptysis, sputum production, shortness of breath and wheezing.   Cardiovascular: Negative for chest pain, palpitations, orthopnea and claudication.  Gastrointestinal: Negative for abdominal pain, blood in stool, constipation, diarrhea, heartburn, melena, nausea and vomiting.  Genitourinary: Negative for dysuria, flank pain, frequency, hematuria and urgency.  Musculoskeletal: Positive for joint pain (Right knee pain). Negative for back pain and myalgias.  Skin: Negative for rash.  Neurological: Positive for sensory change (Peripheral neuropathy). Negative for dizziness, tingling, focal weakness, seizures, weakness and headaches.  Endo/Heme/Allergies: Does not bruise/bleed easily.   Psychiatric/Behavioral: Negative for depression  and suicidal ideas. The patient does not have insomnia.       No Known Allergies   Past Medical History:  Diagnosis Date  . Breast cancer ([place]) 2001   left breast lumpectomy and rad tx  . Breast cancer ([place]) 02/2019   left breast mass w/ biopsy  . Breast cancer ([place]) 2021   right DCIS  . Family history of cancer   . GERD (gastroesophageal reflux disease)   . History of breast cancer   . Hypertension   . Personal history of radiation therapy 2001   BREAST CA     Past Surgical History:  Procedure Laterality Date  . ABDOMINAL HYSTERECTOMY    . BREAST BIOPSY Left 2001   POS  . BREAST BIOPSY Left 2021   positive  . BREAST BIOPSY Right 2021   DCIS  . BREAST LUMPECTOMY Left 2001  . PORT-A-CATH REMOVAL N/A 11/11/2019   Procedure: REMOVAL PORT-A-CATH;  Surgeon: Jules Husbands, MD;  Location: [facility] ORS;  Service: General;  Laterality: N/A;  . PORTACATH PLACEMENT Right 05/16/2019   Procedure: INSERTION PORT-A-CATH;  Surgeon: Jules Husbands, MD;  Location: [facility] ORS;  Service: General;  Laterality: Right;  . SIMPLE MASTECTOMY WITH AXILLARY SENTINEL NODE BIOPSY Bilateral 11/11/2019   Procedure: SIMPLE MASTECTOMY WITH AXILLARY SENTINEL NODE BIOPSY;  Surgeon: Jules Husbands, MD;  Location: [facility] ORS;  Service: General;  Laterality: Bilateral;    Social History   Socioeconomic History  . Marital status: Divorced    Spouse name: Not on file  . Number of children: Not on file  . Years of education: Not on file  . Highest education level: Not on file  Occupational History  . Not on file  Tobacco Use  . Smoking status: Former Smoker    Packs/day: 0.50    Years: 5.00    Pack years: 2.50    Types: Cigarettes    Quit date: 1970    Years since quitting: 52.0  . Smokeless tobacco: Never Used  Vaping Use  . Vaping Use: Never used  Substance and Sexual Activity  . Alcohol use: Never  . Drug use: Never  . Sexual activity: Not Currently   Other Topics Concern  . Not on file  Social History Narrative  . Not on file   Social Determinants of Health   Financial Resource Strain: Not on file  Food Insecurity: Not on file  Transportation Needs: Not on file  Physical Activity: Not on file  Stress: Not on file  Social Connections: Not on file  Intimate Partner Violence: Not on file    Family History  Problem Relation Age of Onset  . Hypertension Mother   . Cancer Maternal Grandmother   . Leukemia Paternal Grandmother   . Cancer Maternal Aunt        unk type  . Breast cancer Neg Hx      Current Outpatient Medications:  .  cholecalciferol (VITAMIN D3) 25 MCG (1000 UNIT) tablet, Take 1,000 Units by mouth daily., Disp: , Rfl:  .  dorzolamide-timolol (COSOPT) 22.3-6.8 MG/ML ophthalmic solution, Administer 1 drop to both eyes Two (2) times a day., Disp: , Rfl:  .  DULoxetine (CYMBALTA) 30 MG capsule, TAKE (1) CAPSULE BY MOUTH ONCE DAILY. INCREASE TO 2 CAPSULES DAILY AFTER 1 WEEK., Disp: 60 capsule, Rfl: 11 .  hydrochlorothiazide (HYDRODIURIL) 25 MG tablet, Take 25 mg by mouth daily., Disp: , Rfl:  .  latanoprost (XALATAN) 0.005 % ophthalmic solution, Place 1 drop into both  eyes at bedtime. , Disp: , Rfl:  .  losartan (COZAAR) 100 MG tablet, Take 100 mg by mouth daily., Disp: , Rfl:  .  pantoprazole (PROTONIX) 40 MG tablet, Take 1 tablet (40 mg total) by mouth daily., Disp: 30 tablet, Rfl: 2 .  potassium chloride SA (KLOR-CON) 20 MEQ tablet, Take 1 tablet (20 mEq total) by mouth 3 (three) times daily., Disp: 90 tablet, Rfl: 3 .  timolol (TIMOPTIC) 0.5 % ophthalmic solution, Place 1 drop into both eyes daily. , Disp: , Rfl:  .  ibuprofen (ADVIL) 800 MG tablet, Take 1 tablet (800 mg total) by mouth every 8 (eight) hours as needed. (Patient not taking: Reported on 04/22/2020), Disp: 30 tablet, Rfl: 0 .  loperamide (IMODIUM A-D) 2 MG tablet, Take 2 mg by mouth 4 (four) times daily as needed for diarrhea or loose stools. (Patient not  taking: Reported on 04/22/2020), Disp: , Rfl:  .  LORazepam (ATIVAN) 0.5 MG tablet, Take 1 tablet (0.5 mg total) by mouth every 6 (six) hours as needed (Nausea or vomiting). (Patient not taking: Reported on 04/22/2020), Disp: 30 tablet, Rfl: 0 .  ondansetron (ZOFRAN) 8 MG tablet, Take 1 tablet (8 mg total) by mouth 2 (two) times daily as needed. Start on the third day after chemotherapy. (Patient not taking: Reported on 04/22/2020), Disp: 30 tablet, Rfl: 1 .  prochlorperazine (COMPAZINE) 10 MG tablet, Take 1 tablet (10 mg total) by mouth every 6 (six) hours as needed (Nausea or vomiting). (Patient not taking: Reported on 04/22/2020), Disp: 30 tablet, Rfl: 1 No current facility-administered medications for this visit.  Facility-Administered Medications Ordered in Other Visits:  .  sodium chloride flush (NS) 0.9 % injection 10 mL, 10 mL, Intravenous, PRN, Sindy Guadeloupe, MD, 10 mL at 10/09/19 0815  Physical exam:  Vitals:   04/22/20 1454  BP: (!) 157/82  Pulse: 76  Temp: 98.2 F (36.8 C)  Weight: 169 lb 11.2 oz (77 kg)   Physical Exam Eyes:     Extraocular Movements: EOM normal.  Cardiovascular:     Rate and Rhythm: Normal rate and regular rhythm.     Heart sounds: Normal heart sounds.  Pulmonary:     Effort: Pulmonary effort is normal.     Breath sounds: Normal breath sounds.  Abdominal:     General: Bowel sounds are normal.     Palpations: Abdomen is soft.  Skin:    General: Skin is warm and dry.  Neurological:     Mental Status: She is alert and oriented to person, place, and time.   Patient is s/p bilateral mastectomy without reconstruction.  No evidence of chest wall recurrence.  No palpable bilateral axillary adenopathy.  CMP Latest Ref Rng & Units 04/22/2020  Glucose 70 - 99 mg/dL 100(H)  BUN 8 - 23 mg/dL 16  Creatinine 0.44 - 1.00 mg/dL 0.85  Sodium 135 - 145 mmol/L 134(L)  Potassium 3.5 - 5.1 mmol/L 3.3(L)  Chloride 98 - 111 mmol/L 100  CO2 22 - 32 mmol/L 24  Calcium 8.9 -  10.3 mg/dL 9.5  Total Protein 6.5 - 8.1 g/dL 7.6  Total Bilirubin 0.3 - 1.2 mg/dL 0.9  Alkaline Phos 38 - 126 U/L 95  AST 15 - 41 U/L 23  ALT 0 - 44 U/L 36   CBC Latest Ref Rng & Units 04/22/2020  WBC 4.0 - 10.5 K/uL 5.1  Hemoglobin 12.0 - 15.0 g/dL 11.9(L)  Hematocrit 36.0 - 46.0 % 35.0(L)  Platelets 150 -  400 K/uL 242      Assessment and plan- Patient is a 65 y.o. female clinical prognostic stage Ia invasive mammary carcinomamcT1 ccN0 cM0 ER/PR negative and HER-2/neu negative.She is s/p neoadjuvant dose dense AC chemotherapy followed by 12 weekly cycles of carbotaxol chemotherapy.She had 10 mm residual carcinoma.  She declined adjuvant Xeloda.  She is here for routine follow-up  Clinically patient is doing well with no concerning signs and symptoms of recurrence based on today's exam.  No evidence of chest wall recurrence.  I will see her back in 4 months with labs  Chemo-induced anemia: Improving and hemoglobin is up to 11.9.  Continue to monitor   Visit Diagnosis 1. Encounter for follow-up surveillance of breast cancer   2. Antineoplastic chemotherapy induced anemia      Dr. Randa Evens, MD, MPH [facility] at [facility] [phone] 04/23/2020 9:03 AM

## 2020-06-01 ENCOUNTER — Telehealth: Payer: Self-pay | Admitting: *Deleted

## 2020-06-01 NOTE — Telephone Encounter (Signed)
Called Patient to let her know that her work letter has been completed and faxed [facility]

## 2020-06-01 NOTE — Telephone Encounter (Signed)
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 Please fax it to her employer [employer] attn Archie Patten [phone]

## 2020-06-04 ENCOUNTER — Other Ambulatory Visit: Payer: Self-pay | Admitting: Oncology

## 2020-06-04 NOTE — Telephone Encounter (Signed)
  Component Ref Range & Units 1 mo ago  (04/22/20) 4 mo ago  (01/08/20) 6 mo ago  (11/27/19) 6 mo ago  (11/12/19) 6 mo ago  (11/11/19) 7 mo ago  (10/30/19) 7 mo ago  (10/17/19)  Potassium 3.5 - 5.1 mmol/L 3.3Low  3.1Low  3.5  3.6   3.6  3.3Low

## 2020-07-28 ENCOUNTER — Other Ambulatory Visit: Payer: Self-pay | Admitting: Oncology

## 2020-08-19 ENCOUNTER — Inpatient Hospital Stay (HOSPITAL_BASED_OUTPATIENT_CLINIC_OR_DEPARTMENT_OTHER): Payer: BC Managed Care – PPO | Admitting: Oncology

## 2020-08-19 ENCOUNTER — Inpatient Hospital Stay: Payer: BC Managed Care – PPO | Attending: Oncology

## 2020-08-19 ENCOUNTER — Encounter: Payer: Self-pay | Admitting: Oncology

## 2020-08-19 VITALS — BP 128/90 | HR 66 | Temp 97.3°F | Resp 20 | Wt 182.5 lb

## 2020-08-19 DIAGNOSIS — Z853 Personal history of malignant neoplasm of breast: Secondary | ICD-10-CM

## 2020-08-19 DIAGNOSIS — Z9013 Acquired absence of bilateral breasts and nipples: Secondary | ICD-10-CM | POA: Diagnosis not present

## 2020-08-19 DIAGNOSIS — Z923 Personal history of irradiation: Secondary | ICD-10-CM | POA: Insufficient documentation

## 2020-08-19 DIAGNOSIS — I10 Essential (primary) hypertension: Secondary | ICD-10-CM | POA: Diagnosis not present

## 2020-08-19 DIAGNOSIS — Z87891 Personal history of nicotine dependence: Secondary | ICD-10-CM | POA: Diagnosis not present

## 2020-08-19 DIAGNOSIS — Z08 Encounter for follow-up examination after completed treatment for malignant neoplasm: Secondary | ICD-10-CM

## 2020-08-19 DIAGNOSIS — Z9221 Personal history of antineoplastic chemotherapy: Secondary | ICD-10-CM | POA: Diagnosis not present

## 2020-08-19 DIAGNOSIS — Z171 Estrogen receptor negative status [ER-]: Secondary | ICD-10-CM | POA: Diagnosis not present

## 2020-08-19 DIAGNOSIS — Z79899 Other long term (current) drug therapy: Secondary | ICD-10-CM | POA: Insufficient documentation

## 2020-08-19 DIAGNOSIS — Z791 Long term (current) use of non-steroidal anti-inflammatories (NSAID): Secondary | ICD-10-CM | POA: Insufficient documentation

## 2020-08-19 LAB — COMPREHENSIVE METABOLIC PANEL
ALT: 23 U/L (ref 0–44)
AST: 20 U/L (ref 15–41)
Albumin: 4.4 g/dL (ref 3.5–5.0)
Alkaline Phosphatase: 101 U/L (ref 38–126)
Anion gap: 10 (ref 5–15)
BUN: 15 mg/dL (ref 8–23)
CO2: 25 mmol/L (ref 22–32)
Calcium: 9.7 mg/dL (ref 8.9–10.3)
Chloride: 100 mmol/L (ref 98–111)
Creatinine, Ser: 0.65 mg/dL (ref 0.44–1.00)
GFR, Estimated: >60 mL/min (ref >60)
Glucose, Bld: 95 mg/dL (ref 70–99)
Potassium: 3.5 mmol/L (ref 3.5–5.1)
Sodium: 135 mmol/L (ref 135–145)
Total Bilirubin: 0.8 mg/dL (ref 0.3–1.2)
Total Protein: 8.1 g/dL (ref 6.5–8.1)

## 2020-08-19 LAB — CBC WITH DIFFERENTIAL/PLATELET
Abs Immature Granulocytes: 0.02 10*3/uL (ref 0.00–0.07)
Basophils Absolute: 0 10*3/uL (ref 0.0–0.1)
Basophils Relative: 0 %
Eosinophils Absolute: 0 10*3/uL (ref 0.0–0.5)
Eosinophils Relative: 1 %
HCT: 37.1 % (ref 36.0–46.0)
Hemoglobin: 12.2 g/dL (ref 12.0–15.0)
Immature Granulocytes: 0 %
Lymphocytes Relative: 44 %
Lymphs Abs: 2.5 10*3/uL (ref 0.7–4.0)
MCH: 27.5 pg (ref 26.0–34.0)
MCHC: 32.9 g/dL (ref 30.0–36.0)
MCV: 83.7 fL (ref 80.0–100.0)
Monocytes Absolute: 0.8 10*3/uL (ref 0.1–1.0)
Monocytes Relative: 14 %
Neutro Abs: 2.3 10*3/uL (ref 1.7–7.7)
Neutrophils Relative %: 41 %
Platelets: 239 10*3/uL (ref 150–400)
RBC: 4.43 MIL/uL (ref 3.87–5.11)
RDW: 13.6 % (ref 11.5–15.5)
WBC: 5.6 10*3/uL (ref 4.0–10.5)
nRBC: 0 % (ref 0.0–0.2)

## 2020-08-20 NOTE — Progress Notes (Signed)
Hematology/Oncology Consult note [facility]  [phone] [phone]  Patient Care Team: Lanny Hurst, MD as PCP - General (Internal Medicine) Rico Junker, RN as Registered Nurse Theodore Demark, RN as Registered Nurse Sindy Guadeloupe, MD as Consulting Physician (Oncology)   Name of the patient: Joshlyn Beadle  [id number]  [birth year]   Date of visit: 08/20/20  Diagnosis- clinical prognostic stage Ia invasive mammary carcinoma cmT1 ccN0 cM0 ER/PR negative and HER-2/neu negative s/p neoadjuvant chemotherapy and bilateral mastectomy   Chief complaint/ Reason for visit-routine follow-up of breast cancer  Heme/Onc history: patient is a 65 year old African-American female with past medical history significant for hypertension. She has had prior breast cancer in 2001 s/p lumpectomy followed by adjuvant radiation treatment. She did not require adjuvant chemotherapy. She does not remember taking any endocrine therapy as well.More recently patient underwent a bilateral screening mammogram in November 2020 which showed a possible mass in the left breast. This was followed by a diagnostic mammogram and ultrasound which showed a 1.6 cm mass at the 7 o'clock position in the left breast near the lumpectomy scar. No left axillary adenopathy. 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   MRI bilateral breast showed:The size of the primary left breast mass appears larger on MRI close to 2.4 x 1.9 x 1.7 cm. She is also noted to have 2 additional enhancing masses 9 x 6 x 10 mm and 5 x 5 x 5 mm in the left breast with suspicious washout kinetics. No pathological left axillary adenopathy. Prominent lymph nodes seen on the right side along with non-mass enhancement spanning 5.3 cm in the AP dimension.Both the enhancing masses in the left breast were positive for triple negative breast  cancer. Right sided axillary lymph node was negative for malignancy.Right breast biopsy showed DCIS  Patient has completed neoadjuvant dose dense AC chemotherapyand 12 weekly cycles of carbotaxol chemotherapy on 10/16/2019.Patient underwent bilateral mastectomy without reconstruction. Residual 10 mm of carcinoma was seen in the left breast. No evidence of malignancy in the right breast. No evidence of residual in situ carcinoma in the right breast. One sentinel lymph node on the left and three sentinel lymph nodes on the right were negative for malignancy. Overall cancer cellularity was 10%.  Adjuvant Xeloda was offered and patient declined it.  genetic testing negative.    Interval history-patient reports doing well presently.  Denies any changes in her appetite.  Denies any new aches or pains anywhere  ECOG PS- 1 Pain scale- 0   Review of systems- Review of Systems  Constitutional: Negative for chills, fever, malaise/fatigue and weight loss.  HENT: Negative for congestion, ear discharge and nosebleeds.   Eyes: Negative for blurred vision.  Respiratory: Negative for cough, hemoptysis, sputum production, shortness of breath and wheezing.   Cardiovascular: Negative for chest pain, palpitations, orthopnea and claudication.  Gastrointestinal: Negative for abdominal pain, blood in stool, constipation, diarrhea, heartburn, melena, nausea and vomiting.  Genitourinary: Negative for dysuria, flank pain, frequency, hematuria and urgency.  Musculoskeletal: Negative for back pain, joint pain and myalgias.  Skin: Negative for rash.  Neurological: Negative for dizziness, tingling, focal weakness, seizures, weakness and headaches.  Endo/Heme/Allergies: Does not bruise/bleed easily.  Psychiatric/Behavioral: Negative for depression and suicidal ideas. The patient does not have insomnia.        No Known Allergies   Past Medical History:  Diagnosis Date  . Breast cancer ([place])  2001    left breast lumpectomy and rad tx  . Breast cancer ([place]) 02/2019   left breast mass w/ biopsy  . Breast cancer ([place]) 2021   right DCIS  . Family history of cancer   . GERD (gastroesophageal reflux disease)   . History of breast cancer   . Hypertension   . Personal history of radiation therapy 2001   BREAST CA     Past Surgical History:  Procedure Laterality Date  . ABDOMINAL HYSTERECTOMY    . BREAST BIOPSY Left 2001   POS  . BREAST BIOPSY Left 2021   positive  . BREAST BIOPSY Right 2021   DCIS  . BREAST LUMPECTOMY Left 2001  . PORT-A-CATH REMOVAL N/A 11/11/2019   Procedure: REMOVAL PORT-A-CATH;  Surgeon: Jules Husbands, MD;  Location: [facility] ORS;  Service: General;  Laterality: N/A;  . PORTACATH PLACEMENT Right 05/16/2019   Procedure: INSERTION PORT-A-CATH;  Surgeon: Jules Husbands, MD;  Location: [facility] ORS;  Service: General;  Laterality: Right;  . SIMPLE MASTECTOMY WITH AXILLARY SENTINEL NODE BIOPSY Bilateral 11/11/2019   Procedure: SIMPLE MASTECTOMY WITH AXILLARY SENTINEL NODE BIOPSY;  Surgeon: Jules Husbands, MD;  Location: [facility] ORS;  Service: General;  Laterality: Bilateral;    Social History   Socioeconomic History  . Marital status: Divorced    Spouse name: Not on file  . Number of children: Not on file  . Years of education: Not on file  . Highest education level: Not on file  Occupational History  . Not on file  Tobacco Use  . Smoking status: Former Smoker    Packs/day: 0.50    Years: 5.00    Pack years: 2.50    Types: Cigarettes    Quit date: 1970    Years since quitting: 52.3  . Smokeless tobacco: Never Used  Vaping Use  . Vaping Use: Never used  Substance and Sexual Activity  . Alcohol use: Never  . Drug use: Never  . Sexual activity: Not Currently  Other Topics Concern  . Not on file  Social History Narrative  . Not on file   Social Determinants of Health   Financial Resource Strain: Not on file  Food Insecurity: Not on file  Transportation  Needs: Not on file  Physical Activity: Not on file  Stress: Not on file  Social Connections: Not on file  Intimate Partner Violence: Not on file    Family History  Problem Relation Age of Onset  . Hypertension Mother   . Cancer Maternal Grandmother   . Leukemia Paternal Grandmother   . Cancer Maternal Aunt        unk type  . Breast cancer Neg Hx      Current Outpatient Medications:  .  cholecalciferol (VITAMIN D3) 25 MCG (1000 UNIT) tablet, Take 1,000 Units by mouth daily., Disp: , Rfl:  .  dorzolamide-timolol (COSOPT) 22.3-6.8 MG/ML ophthalmic solution, Administer 1 drop to both eyes Two (2) times a day., Disp: , Rfl:  .  DULoxetine (CYMBALTA) 30 MG capsule, TAKE (1) CAPSULE BY MOUTH ONCE DAILY. INCREASE TO 2 CAPSULES DAILY AFTER 1 WEEK., Disp: 60 capsule, Rfl: 11 .  hydrochlorothiazide (HYDRODIURIL) 25 MG tablet, Take 25 mg by mouth daily., Disp: , Rfl:  .  latanoprost (XALATAN) 0.005 % ophthalmic solution, Place 1 drop into both eyes at bedtime. , Disp: , Rfl:  .  losartan (COZAAR) 100 MG tablet, Take 100 mg by mouth daily., Disp: , Rfl:  .  potassium chloride SA (  KLOR-CON) 20 MEQ tablet, TAKE (1) TABLET BY MOUTH THREE TIMES DAILY, Disp: 90 tablet, Rfl: 0 .  timolol (TIMOPTIC) 0.5 % ophthalmic solution, Place 1 drop into both eyes daily. , Disp: , Rfl:  .  ibuprofen (ADVIL) 800 MG tablet, Take 1 tablet (800 mg total) by mouth every 8 (eight) hours as needed. (Patient not taking: No sig reported), Disp: 30 tablet, Rfl: 0 .  loperamide (IMODIUM A-D) 2 MG tablet, Take 2 mg by mouth 4 (four) times daily as needed for diarrhea or loose stools. (Patient not taking: No sig reported), Disp: , Rfl:  .  LORazepam (ATIVAN) 0.5 MG tablet, Take 1 tablet (0.5 mg total) by mouth every 6 (six) hours as needed (Nausea or vomiting). (Patient not taking: No sig reported), Disp: 30 tablet, Rfl: 0 .  ondansetron (ZOFRAN) 8 MG tablet, Take 1 tablet (8 mg total) by mouth 2 (two) times daily as needed.  Start on the third day after chemotherapy. (Patient not taking: No sig reported), Disp: 30 tablet, Rfl: 1 .  prochlorperazine (COMPAZINE) 10 MG tablet, Take 1 tablet (10 mg total) by mouth every 6 (six) hours as needed (Nausea or vomiting). (Patient not taking: No sig reported), Disp: 30 tablet, Rfl: 1 No current facility-administered medications for this visit.  Facility-Administered Medications Ordered in Other Visits:  .  sodium chloride flush (NS) 0.9 % injection 10 mL, 10 mL, Intravenous, PRN, Sindy Guadeloupe, MD, 10 mL at 10/09/19 0815  Physical exam:  Vitals:   08/19/20 1442  BP: 128/90  Pulse: 66  Resp: 20  Temp: (!) 97.3 F (36.3 C)  TempSrc: Tympanic  SpO2: 100%  Weight: 182 lb 8 oz (82.8 kg)   Physical Exam HENT:     Head: Normocephalic and atraumatic.  Cardiovascular:     Rate and Rhythm: Normal rate and regular rhythm.     Heart sounds: Normal heart sounds.  Pulmonary:     Effort: Pulmonary effort is normal.     Breath sounds: Normal breath sounds.  Abdominal:     General: Bowel sounds are normal.     Palpations: Abdomen is soft.  Musculoskeletal:     Comments: Right knee is in a brace  Skin:    General: Skin is warm and dry.  Neurological:     Mental Status: She is alert and oriented to person, place, and time.   Breast exam: Patient is s/p bilateral mastectomy without reconstruction.  No evidence of chest wall recurrence.  Mastectomy scar is well-healed.  No palpable bilateral axillary adenopathy.  CMP Latest Ref Rng & Units 08/19/2020  Glucose 70 - 99 mg/dL 95  BUN 8 - 23 mg/dL 15  Creatinine 0.44 - 1.00 mg/dL 0.65  Sodium 135 - 145 mmol/L 135  Potassium 3.5 - 5.1 mmol/L 3.5  Chloride 98 - 111 mmol/L 100  CO2 22 - 32 mmol/L 25  Calcium 8.9 - 10.3 mg/dL 9.7  Total Protein 6.5 - 8.1 g/dL 8.1  Total Bilirubin 0.3 - 1.2 mg/dL 0.8  Alkaline Phos 38 - 126 U/L 101  AST 15 - 41 U/L 20  ALT 0 - 44 U/L 23   CBC Latest Ref Rng & Units 08/19/2020  WBC 4.0 - 10.5  K/uL 5.6  Hemoglobin 12.0 - 15.0 g/dL 12.2  Hematocrit 36.0 - 46.0 % 37.1  Platelets 150 - 400 K/uL 239     Assessment and plan- Patient is a 65 y.o. female  clinical prognostic stage Ia invasive mammary carcinomamcT1 ccN0 cM0  ER/PR negative and HER-2/neu negative.She is s/p neoadjuvant dose dense AC chemotherapy followed by 12 weekly cycles of carbotaxol chemotherapy.She had 10 mm residual carcinoma.  She declined adjuvant Xeloda.  This is a routine follow-up visit  Clinically patient is doing well with no concerning signs and symptoms of recurrence based on today's exam.  No evidence of chest wall recurrence.  I will see her back in 6 months.  Chemo induced anemia: Now resolved   Visit Diagnosis 1. Encounter for follow-up surveillance of breast cancer      Dr. Randa Evens, MD, MPH [facility] at [facility] [phone] 08/20/2020 12:37 PM

## 2020-09-14 ENCOUNTER — Other Ambulatory Visit: Payer: Self-pay | Admitting: Oncology

## 2020-09-15 ENCOUNTER — Encounter: Payer: Self-pay | Admitting: Oncology

## 2020-10-29 ENCOUNTER — Other Ambulatory Visit: Payer: Self-pay | Admitting: Oncology

## 2020-11-04 ENCOUNTER — Telehealth: Payer: Self-pay | Admitting: Oncology

## 2020-11-04 ENCOUNTER — Other Ambulatory Visit: Payer: Self-pay | Admitting: *Deleted

## 2020-11-04 MED ORDER — PANTOPRAZOLE SODIUM 20 MG PO TBEC: 20.0000 mg | DELAYED_RELEASE_TABLET | Freq: Every day | ORAL | 3 refills | Status: AC

## 2020-11-04 NOTE — Telephone Encounter (Signed)
I asked Janese Banks and she said it is ok to give pt protonix and I sent med into pt's [employer]. I tried pt. Home phone and it beeps and beeps and no voicemail. Then I called her cell phone and can't get in touch with her and no way to leave message there and called pharmacy to see if they have a different phone number and they do not. I then called her sister Hassan Rowan and she will tell her sister that she has rx at pharmacy

## 2020-11-08 ENCOUNTER — Other Ambulatory Visit: Payer: Self-pay | Admitting: Oncology

## 2021-01-10 ENCOUNTER — Other Ambulatory Visit: Payer: Self-pay | Admitting: Oncology

## 2021-01-11 ENCOUNTER — Other Ambulatory Visit: Payer: Self-pay | Admitting: Oncology

## 2021-01-12 ENCOUNTER — Encounter: Payer: Self-pay | Admitting: Oncology

## 2021-02-01 ENCOUNTER — Encounter: Payer: Self-pay | Admitting: Oncology

## 2021-02-15 ENCOUNTER — Inpatient Hospital Stay: Payer: BC Managed Care – PPO | Attending: Oncology

## 2021-02-15 ENCOUNTER — Inpatient Hospital Stay (HOSPITAL_BASED_OUTPATIENT_CLINIC_OR_DEPARTMENT_OTHER): Payer: BC Managed Care – PPO | Admitting: Oncology

## 2021-02-15 ENCOUNTER — Encounter: Payer: Self-pay | Admitting: Oncology

## 2021-02-15 ENCOUNTER — Other Ambulatory Visit: Payer: Self-pay

## 2021-02-15 VITALS — BP 149/100 | HR 75 | Temp 98.0°F | Resp 18 | Wt 184.9 lb

## 2021-02-15 DIAGNOSIS — Z171 Estrogen receptor negative status [ER-]: Secondary | ICD-10-CM | POA: Insufficient documentation

## 2021-02-15 DIAGNOSIS — Z87891 Personal history of nicotine dependence: Secondary | ICD-10-CM | POA: Insufficient documentation

## 2021-02-15 DIAGNOSIS — Z853 Personal history of malignant neoplasm of breast: Secondary | ICD-10-CM

## 2021-02-15 DIAGNOSIS — C50312 Malignant neoplasm of lower-inner quadrant of left female breast: Secondary | ICD-10-CM | POA: Diagnosis present

## 2021-02-15 DIAGNOSIS — Z08 Encounter for follow-up examination after completed treatment for malignant neoplasm: Secondary | ICD-10-CM | POA: Diagnosis not present

## 2021-02-15 DIAGNOSIS — Z806 Family history of leukemia: Secondary | ICD-10-CM | POA: Diagnosis not present

## 2021-02-15 DIAGNOSIS — I10 Essential (primary) hypertension: Secondary | ICD-10-CM | POA: Insufficient documentation

## 2021-02-15 LAB — COMPREHENSIVE METABOLIC PANEL
ALT: 18 U/L (ref 0–44)
AST: 18 U/L (ref 15–41)
Albumin: 4.1 g/dL (ref 3.5–5.0)
Alkaline Phosphatase: 103 U/L (ref 38–126)
Anion gap: 11 (ref 5–15)
BUN: 16 mg/dL (ref 8–23)
CO2: 26 mmol/L (ref 22–32)
Calcium: 9.2 mg/dL (ref 8.9–10.3)
Chloride: 99 mmol/L (ref 98–111)
Creatinine, Ser: 0.97 mg/dL (ref 0.44–1.00)
GFR, Estimated: >60 mL/min (ref >60)
Glucose, Bld: 115 mg/dL — ABNORMAL HIGH (ref 70–99)
Potassium: 3.2 mmol/L — ABNORMAL LOW (ref 3.5–5.1)
Sodium: 136 mmol/L (ref 135–145)
Total Bilirubin: 0.6 mg/dL (ref 0.3–1.2)
Total Protein: 7.7 g/dL (ref 6.5–8.1)

## 2021-02-15 LAB — CBC WITH DIFFERENTIAL/PLATELET
Abs Immature Granulocytes: 0.04 10*3/uL (ref 0.00–0.07)
Basophils Absolute: 0 10*3/uL (ref 0.0–0.1)
Basophils Relative: 0 %
Eosinophils Absolute: 0.1 10*3/uL (ref 0.0–0.5)
Eosinophils Relative: 1 %
HCT: 37 % (ref 36.0–46.0)
Hemoglobin: 12.4 g/dL (ref 12.0–15.0)
Immature Granulocytes: 1 %
Lymphocytes Relative: 40 %
Lymphs Abs: 2.6 10*3/uL (ref 0.7–4.0)
MCH: 28.6 pg (ref 26.0–34.0)
MCHC: 33.5 g/dL (ref 30.0–36.0)
MCV: 85.3 fL (ref 80.0–100.0)
Monocytes Absolute: 0.7 10*3/uL (ref 0.1–1.0)
Monocytes Relative: 10 %
Neutro Abs: 3.1 10*3/uL (ref 1.7–7.7)
Neutrophils Relative %: 48 %
Platelets: 233 10*3/uL (ref 150–400)
RBC: 4.34 MIL/uL (ref 3.87–5.11)
RDW: 13.1 % (ref 11.5–15.5)
WBC: 6.5 10*3/uL (ref 4.0–10.5)
nRBC: 0 % (ref 0.0–0.2)

## 2021-02-16 ENCOUNTER — Encounter: Payer: Self-pay | Admitting: Oncology

## 2021-02-16 MED ORDER — POTASSIUM CHLORIDE CRYS ER 20 MEQ PO TBCR: EXTENDED_RELEASE_TABLET | ORAL | 5 refills | Status: DC

## 2021-02-16 NOTE — Progress Notes (Signed)
Hematology/Oncology Consult note [facility]  [phone] [phone]  Patient Care Team: Lanny Hurst, MD as PCP - General (Internal Medicine) Rico Junker, RN as Registered Nurse Theodore Demark, RN as Registered Nurse Sindy Guadeloupe, MD as Consulting Physician (Oncology)   Name of the patient: Molly Walters  [id number]  06-01-1955   Date of visit: 02/16/21  Diagnosis- clinical prognostic stage Ia invasive mammary carcinoma cmT1 ccN0 cM0 ER/PR negative and HER-2/neu negative s/p neoadjuvant chemotherapy and bilateral mastectomy    Chief complaint/ Reason for visit-routine follow-up of breast cancer  Heme/Onc history:  patient is a 65 year old African-American female with past medical history significant for hypertension.  She has had prior breast cancer in 2001 s/p lumpectomy followed by adjuvant radiation treatment.  She did not require adjuvant chemotherapy.  She does not remember taking any endocrine therapy as well.More recently patient underwent a bilateral screening mammogram in November 2020 which showed a possible mass in the left breast.  This was followed by a diagnostic mammogram and ultrasound which showed a 1.6 cm mass at the 7 o'clock position in the left breast near the lumpectomy scar.  No left axillary adenopathy.  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    MRI bilateral breast showed:The size of the primary left breast mass appears larger on MRI close to 2.4 x 1.9 x 1.7 cm.  She is also noted to have 2 additional enhancing masses 9 x 6 x 10 mm and 5 x 5 x 5 mm in the left breast with suspicious washout kinetics.  No pathological left axillary adenopathy.  Prominent lymph nodes seen on the right side along with non-mass enhancement spanning 5.3 cm in the AP dimension.  Both the enhancing masses in the left breast were positive for triple negative breast  cancer.  Right sided axillary lymph node was negative for malignancy.  Right breast biopsy showed DCIS    Patient has completed neoadjuvant dose dense AC chemotherapy and 12 weekly cycles of carbotaxol chemotherapy on 10/16/2019.    Patient underwent bilateral mastectomy without reconstruction.  Residual 10 mm of carcinoma was seen in the left breast.  No evidence of malignancy in the right breast.  No evidence of residual in situ carcinoma in the right breast.  One sentinel lymph node on the left and three sentinel lymph nodes on the right were negative for malignancy.  Overall cancer cellularity was 10%.  Adjuvant Xeloda was offered and patient declined it.    genetic testing negative.     Interval history-patient feels well overall.  Denies any new aches and pains anywhere.  Appetite and weight have remained stable.  She has some ongoing vision issues due to her glaucoma.  ECOG PS- 1 Pain scale- 0   Review of systems- Review of Systems  Constitutional:  Negative for chills, fever, malaise/fatigue and weight loss.  HENT:  Negative for congestion, ear discharge and nosebleeds.   Eyes:  Negative for blurred vision.  Respiratory:  Negative for cough, hemoptysis, sputum production, shortness of breath and wheezing.   Cardiovascular:  Negative for chest pain, palpitations, orthopnea and claudication.  Gastrointestinal:  Negative for abdominal pain, blood in stool, constipation, diarrhea, heartburn, melena, nausea and vomiting.  Genitourinary:  Negative for dysuria, flank pain, frequency, hematuria and urgency.  Musculoskeletal:  Negative for back pain, joint pain and myalgias.  Skin:  Negative for rash.  Neurological:  Negative  for dizziness, tingling, focal weakness, seizures, weakness and headaches.  Endo/Heme/Allergies:  Does not bruise/bleed easily.  Psychiatric/Behavioral:  Negative for depression and suicidal ideas. The patient does not have insomnia.     No Known Allergies   Past  Medical History:  Diagnosis Date   Breast cancer ([place]) 2001   left breast lumpectomy and rad tx   Breast cancer ([place]) 02/2019   left breast mass w/ biopsy   Breast cancer ([place]) 2021   right DCIS   Family history of cancer    GERD (gastroesophageal reflux disease)    History of breast cancer    Hypertension    Personal history of radiation therapy 2001   BREAST CA     Past Surgical History:  Procedure Laterality Date   ABDOMINAL HYSTERECTOMY     BREAST BIOPSY Left 2001   POS   BREAST BIOPSY Left 2021   positive   BREAST BIOPSY Right 2021   DCIS   BREAST LUMPECTOMY Left 2001   PORT-A-CATH REMOVAL N/A 11/11/2019   Procedure: REMOVAL PORT-A-CATH;  Surgeon: Jules Husbands, MD;  Location: [facility] ORS;  Service: General;  Laterality: N/A;   PORTACATH PLACEMENT Right 05/16/2019   Procedure: INSERTION PORT-A-CATH;  Surgeon: Jules Husbands, MD;  Location: [facility] ORS;  Service: General;  Laterality: Right;   SIMPLE MASTECTOMY WITH AXILLARY SENTINEL NODE BIOPSY Bilateral 11/11/2019   Procedure: SIMPLE MASTECTOMY WITH AXILLARY SENTINEL NODE BIOPSY;  Surgeon: Jules Husbands, MD;  Location: [facility] ORS;  Service: General;  Laterality: Bilateral;    Social History   Socioeconomic History   Marital status: Divorced    Spouse name: Not on file   Number of children: Not on file   Years of education: Not on file   Highest education level: Not on file  Occupational History   Not on file  Tobacco Use   Smoking status: Former    Packs/day: 0.50    Years: 5.00    Pack years: 2.50    Types: Cigarettes    Quit date: 1970    Years since quitting: 52.8   Smokeless tobacco: Never  Vaping Use   Vaping Use: Never used  Substance and Sexual Activity   Alcohol use: Never   Drug use: Never   Sexual activity: Not Currently  Other Topics Concern   Not on file  Social History Narrative   Not on file   Social Determinants of Health   Financial Resource Strain: Not on file  Food Insecurity: Not on  file  Transportation Needs: Not on file  Physical Activity: Not on file  Stress: Not on file  Social Connections: Not on file  Intimate Partner Violence: Not on file    Family History  Problem Relation Age of Onset   Hypertension Mother    Cancer Maternal Grandmother    Leukemia Paternal Grandmother    Cancer Maternal Aunt        unk type   Breast cancer Neg Hx      Current Outpatient Medications:    cholecalciferol (VITAMIN D3) 25 MCG (1000 UNIT) tablet, Take 1,000 Units by mouth daily., Disp: , Rfl:    dorzolamide-timolol (COSOPT) 22.3-6.8 MG/ML ophthalmic solution, Administer 1 drop to both eyes Two (2) times a day., Disp: , Rfl:    DULoxetine (CYMBALTA) 30 MG capsule, TAKE (1) CAPSULE BY MOUTH ONCE DAILY. INCREASE TO 2 CAPSULES DAILY AFTER 1 WEEK., Disp: 60 capsule, Rfl: 11   hydrochlorothiazide (HYDRODIURIL) 25 MG tablet, Take 25 mg by  mouth daily., Disp: , Rfl:    latanoprost (XALATAN) 0.005 % ophthalmic solution, Place 1 drop into both eyes at bedtime. , Disp: , Rfl:    losartan (COZAAR) 100 MG tablet, Take 100 mg by mouth daily., Disp: , Rfl:    pantoprazole (PROTONIX) 20 MG tablet, Take 1 tablet (20 mg total) by mouth daily., Disp: 30 tablet, Rfl: 3   potassium chloride SA (KLOR-CON) 20 MEQ tablet, TAKE (1) TABLET BY MOUTH THREE TIMES DAILY, Disp: 90 tablet, Rfl: 0   timolol (TIMOPTIC) 0.5 % ophthalmic solution, Place 1 drop into both eyes daily. , Disp: , Rfl:    ibuprofen (ADVIL) 800 MG tablet, Take 1 tablet (800 mg total) by mouth every 8 (eight) hours as needed. (Patient not taking: No sig reported), Disp: 30 tablet, Rfl: 0   loperamide (IMODIUM A-D) 2 MG tablet, Take 2 mg by mouth 4 (four) times daily as needed for diarrhea or loose stools. (Patient not taking: No sig reported), Disp: , Rfl:    LORazepam (ATIVAN) 0.5 MG tablet, Take 1 tablet (0.5 mg total) by mouth every 6 (six) hours as needed (Nausea or vomiting). (Patient not taking: No sig reported), Disp: 30 tablet,  Rfl: 0   ondansetron (ZOFRAN) 8 MG tablet, Take 1 tablet (8 mg total) by mouth 2 (two) times daily as needed. Start on the third day after chemotherapy. (Patient not taking: No sig reported), Disp: 30 tablet, Rfl: 1   prochlorperazine (COMPAZINE) 10 MG tablet, Take 1 tablet (10 mg total) by mouth every 6 (six) hours as needed (Nausea or vomiting). (Patient not taking: No sig reported), Disp: 30 tablet, Rfl: 1 No current facility-administered medications for this visit.  Facility-Administered Medications Ordered in Other Visits:    sodium chloride flush (NS) 0.9 % injection 10 mL, 10 mL, Intravenous, PRN, Sindy Guadeloupe, MD, 10 mL at 10/09/19 0815  Physical exam:  Vitals:   02/15/21 1459  BP: (!) 149/100  Pulse: 75  Resp: 18  Temp: 98 F (36.7 C)  TempSrc: Tympanic  SpO2: 99%  Weight: 184 lb 14.4 oz (83.9 kg)   Physical Exam Constitutional:      General: She is not in acute distress. Cardiovascular:     Rate and Rhythm: Normal rate and regular rhythm.     Heart sounds: Normal heart sounds.  Pulmonary:     Effort: Pulmonary effort is normal.     Breath sounds: Normal breath sounds.  Abdominal:     General: Bowel sounds are normal.     Palpations: Abdomen is soft.  Skin:    General: Skin is warm and dry.  Neurological:     Mental Status: She is alert and oriented to person, place, and time.  Breast exam: Patient is s/p bilateral mastectomy without reconstruction.  No evidence of chest wall recurrence.  No palpable bilateral axillary adenopathy.   CMP Latest Ref Rng & Units 02/15/2021  Glucose 70 - 99 mg/dL 115(H)  BUN 8 - 23 mg/dL 16  Creatinine 0.44 - 1.00 mg/dL 0.97  Sodium 135 - 145 mmol/L 136  Potassium 3.5 - 5.1 mmol/L 3.2(L)  Chloride 98 - 111 mmol/L 99  CO2 22 - 32 mmol/L 26  Calcium 8.9 - 10.3 mg/dL 9.2  Total Protein 6.5 - 8.1 g/dL 7.7  Total Bilirubin 0.3 - 1.2 mg/dL 0.6  Alkaline Phos 38 - 126 U/L 103  AST 15 - 41 U/L 18  ALT 0 - 44 U/L 18   CBC Latest Ref  Rng & Units 02/15/2021  WBC 4.0 - 10.5 K/uL 6.5  Hemoglobin 12.0 - 15.0 g/dL 12.4  Hematocrit 36.0 - 46.0 % 37.0  Platelets 150 - 400 K/uL 233     Assessment and plan- Patient is a 65 y.o. female clinical prognostic stage Ia invasive mammary carcinoma mcT1 ccN0 cM0 ER/PR negative and HER-2/neu negative.  She is s/p neoadjuvant dose dense AC chemotherapy followed by 12 weekly cycles of carbotaxol chemotherapy.  She had 10 mm residual carcinoma.  She declined adjuvant Xeloda.  She is here for routine follow-up  Clinically patient is doing well with no concerning signs and symptoms of recurrence based on today's exam.I will see her back in 6 months with labs.   Visit Diagnosis 1. Encounter for follow-up surveillance of breast cancer      Dr. Randa Evens, MD, MPH [facility] at [facility] [phone] 02/16/2021 8:20 AM

## 2021-02-18 ENCOUNTER — Ambulatory Visit: Payer: BC Managed Care – PPO | Admitting: Oncology

## 2021-02-23 ENCOUNTER — Other Ambulatory Visit: Payer: Self-pay | Admitting: Oncology

## 2021-04-29 ENCOUNTER — Encounter: Payer: Self-pay | Admitting: Oncology

## 2021-05-04 ENCOUNTER — Other Ambulatory Visit: Payer: Self-pay | Admitting: *Deleted

## 2021-05-04 MED ORDER — DULOXETINE HCL 30 MG PO CPEP: ORAL_CAPSULE | ORAL | 1 refills | Status: DC

## 2021-08-05 ENCOUNTER — Other Ambulatory Visit: Payer: Self-pay | Admitting: Oncology

## 2021-08-15 ENCOUNTER — Encounter: Payer: Self-pay | Admitting: Oncology

## 2021-08-15 ENCOUNTER — Inpatient Hospital Stay (HOSPITAL_BASED_OUTPATIENT_CLINIC_OR_DEPARTMENT_OTHER): Payer: BC Managed Care – PPO | Admitting: Oncology

## 2021-08-15 ENCOUNTER — Inpatient Hospital Stay: Payer: BC Managed Care – PPO | Attending: Oncology

## 2021-08-15 VITALS — BP 143/90 | HR 73 | Temp 97.0°F | Resp 16 | Wt 184.6 lb

## 2021-08-15 DIAGNOSIS — Z87891 Personal history of nicotine dependence: Secondary | ICD-10-CM | POA: Insufficient documentation

## 2021-08-15 DIAGNOSIS — Z853 Personal history of malignant neoplasm of breast: Secondary | ICD-10-CM | POA: Insufficient documentation

## 2021-08-15 DIAGNOSIS — Z90710 Acquired absence of both cervix and uterus: Secondary | ICD-10-CM | POA: Insufficient documentation

## 2021-08-15 DIAGNOSIS — Z08 Encounter for follow-up examination after completed treatment for malignant neoplasm: Secondary | ICD-10-CM

## 2021-08-15 DIAGNOSIS — I10 Essential (primary) hypertension: Secondary | ICD-10-CM | POA: Diagnosis not present

## 2021-08-15 DIAGNOSIS — Z806 Family history of leukemia: Secondary | ICD-10-CM | POA: Diagnosis not present

## 2021-08-15 LAB — COMPREHENSIVE METABOLIC PANEL
ALT: 14 U/L (ref 0–44)
AST: 16 U/L (ref 15–41)
Albumin: 4.3 g/dL (ref 3.5–5.0)
Alkaline Phosphatase: 91 U/L (ref 38–126)
Anion gap: 6 (ref 5–15)
BUN: 14 mg/dL (ref 8–23)
CO2: 28 mmol/L (ref 22–32)
Calcium: 9.5 mg/dL (ref 8.9–10.3)
Chloride: 99 mmol/L (ref 98–111)
Creatinine, Ser: 0.82 mg/dL (ref 0.44–1.00)
GFR, Estimated: >60 mL/min (ref >60)
Glucose, Bld: 111 mg/dL — ABNORMAL HIGH (ref 70–99)
Potassium: 3.7 mmol/L (ref 3.5–5.1)
Sodium: 133 mmol/L — ABNORMAL LOW (ref 135–145)
Total Bilirubin: 0.7 mg/dL (ref 0.3–1.2)
Total Protein: 8.1 g/dL (ref 6.5–8.1)

## 2021-08-15 LAB — CBC WITH DIFFERENTIAL/PLATELET
Abs Immature Granulocytes: 0.03 10*3/uL (ref 0.00–0.07)
Basophils Absolute: 0 10*3/uL (ref 0.0–0.1)
Basophils Relative: 0 %
Eosinophils Absolute: 0.1 10*3/uL (ref 0.0–0.5)
Eosinophils Relative: 1 %
HCT: 39.8 % (ref 36.0–46.0)
Hemoglobin: 13.4 g/dL (ref 12.0–15.0)
Immature Granulocytes: 0 %
Lymphocytes Relative: 35 %
Lymphs Abs: 2.5 10*3/uL (ref 0.7–4.0)
MCH: 28.5 pg (ref 26.0–34.0)
MCHC: 33.7 g/dL (ref 30.0–36.0)
MCV: 84.5 fL (ref 80.0–100.0)
Monocytes Absolute: 0.9 10*3/uL (ref 0.1–1.0)
Monocytes Relative: 12 %
Neutro Abs: 3.6 10*3/uL (ref 1.7–7.7)
Neutrophils Relative %: 52 %
Platelets: 255 10*3/uL (ref 150–400)
RBC: 4.71 MIL/uL (ref 3.87–5.11)
RDW: 13.1 % (ref 11.5–15.5)
WBC: 7.1 10*3/uL (ref 4.0–10.5)
nRBC: 0 % (ref 0.0–0.2)

## 2021-08-15 NOTE — Progress Notes (Signed)
Hematology/Oncology Consult note [facility]  [phone] [phone]  Patient Care Team: Quentin Cornwall, MD as PCP - General (Internal Medicine) Jim Like, RN as Registered Nurse Scarlett Presto, RN as Registered Nurse Creig Hines, MD as Consulting Physician (Oncology) Leafy Ro, MD as Consulting Physician (General Surgery)   Name of the patient: Molly Walters  [id number]  09/17/1955   Date of visit: 08/15/21  Diagnosis- clinical prognostic stage Ia invasive mammary carcinoma cmT1 ccN0 cM0 ER/PR negative and HER-2/neu negative s/p neoadjuvant chemotherapy and bilateral mastectomy  Chief complaint/ Reason for visit-routine follow-up of breast cancer  Heme/Onc history: patient is a 66 year old African-American female with past medical history significant for hypertension.  She has had prior breast cancer in 2001 s/p lumpectomy followed by adjuvant radiation treatment.  She did not require adjuvant chemotherapy.  She does not remember taking any endocrine therapy as well.More recently patient underwent a bilateral screening mammogram in November 2020 which showed a possible mass in the left breast.  This was followed by a diagnostic mammogram and ultrasound which showed a 1.6 cm mass at the 7 o'clock position in the left breast near the lumpectomy scar.  No left axillary adenopathy.  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    MRI bilateral breast showed:The size of the primary left breast mass appears larger on MRI close to 2.4 x 1.9 x 1.7 cm.  She is also noted to have 2 additional enhancing masses 9 x 6 x 10 mm and 5 x 5 x 5 mm in the left breast with suspicious washout kinetics.  No pathological left axillary adenopathy.  Prominent lymph nodes seen on the right side along with non-mass enhancement spanning 5.3 cm in the AP dimension.  Both the enhancing masses in  the left breast were positive for triple negative breast cancer.  Right sided axillary lymph node was negative for malignancy.  Right breast biopsy showed DCIS    Patient has completed neoadjuvant dose dense AC chemotherapy and 12 weekly cycles of carbotaxol chemotherapy on 10/16/2019.    Patient underwent bilateral mastectomy without reconstruction.  Residual 10 mm of carcinoma was seen in the left breast.  No evidence of malignancy in the right breast.  No evidence of residual in situ carcinoma in the right breast.  One sentinel lymph node on the left and three sentinel lymph nodes on the right were negative for malignancy.  Overall cancer cellularity was 10%.  Adjuvant Xeloda was offered and patient declined it.    genetic testing negative.     Interval history-patient has chronic right knee pain for which she wears a brace.  Denies other complaints at this time.  Appetite and weight have remained stable.  Denies any new aches and pains anywhere  ECOG PS- 1 Pain scale- 0   Review of systems- Review of Systems  Constitutional:  Negative for chills, fever, malaise/fatigue and weight loss.  HENT:  Negative for congestion, ear discharge and nosebleeds.   Eyes:  Negative for blurred vision.  Respiratory:  Negative for cough, hemoptysis, sputum production, shortness of breath and wheezing.   Cardiovascular:  Negative for chest pain, palpitations, orthopnea and claudication.  Gastrointestinal:  Negative for abdominal pain, blood in stool, constipation, diarrhea, heartburn, melena, nausea and vomiting.  Genitourinary:  Negative for dysuria, flank pain, frequency, hematuria and urgency.  Musculoskeletal:  Negative for back pain, joint pain and myalgias.  Skin:  Negative for rash.  Neurological:  Negative for dizziness, tingling, focal weakness, seizures, weakness and headaches.  Endo/Heme/Allergies:  Does not bruise/bleed easily.  Psychiatric/Behavioral:  Negative for depression and suicidal ideas.  The patient does not have insomnia.       No Known Allergies   Past Medical History:  Diagnosis Date   Breast cancer ([facility]) 2001   left breast lumpectomy and rad tx   Breast cancer ([facility]) 02/2019   left breast mass w/ biopsy   Breast cancer ([facility]) 2021   right DCIS   Family history of cancer    GERD (gastroesophageal reflux disease)    History of breast cancer    Hypertension    Personal history of radiation therapy 2001   BREAST CA     Past Surgical History:  Procedure Laterality Date   ABDOMINAL HYSTERECTOMY     BREAST BIOPSY Left 2001   POS   BREAST BIOPSY Left 2021   positive   BREAST BIOPSY Right 2021   DCIS   BREAST LUMPECTOMY Left 2001   PORT-A-CATH REMOVAL N/A 11/11/2019   Procedure: REMOVAL PORT-A-CATH;  Surgeon: Leafy Ro, MD;  Location: [facility] ORS;  Service: General;  Laterality: N/A;   PORTACATH PLACEMENT Right 05/16/2019   Procedure: INSERTION PORT-A-CATH;  Surgeon: Leafy Ro, MD;  Location: [facility] ORS;  Service: General;  Laterality: Right;   SIMPLE MASTECTOMY WITH AXILLARY SENTINEL NODE BIOPSY Bilateral 11/11/2019   Procedure: SIMPLE MASTECTOMY WITH AXILLARY SENTINEL NODE BIOPSY;  Surgeon: Leafy Ro, MD;  Location: [facility] ORS;  Service: General;  Laterality: Bilateral;    Social History   Socioeconomic History   Marital status: Divorced    Spouse name: Not on file   Number of children: Not on file   Years of education: Not on file   Highest education level: Not on file  Occupational History   Not on file  Tobacco Use   Smoking status: Former    Packs/day: 0.50    Years: 5.00    Pack years: 2.50    Types: Cigarettes    Quit date: 1970    Years since quitting: 53.3   Smokeless tobacco: Never  Vaping Use   Vaping Use: Never used  Substance and Sexual Activity   Alcohol use: Never   Drug use: Never   Sexual activity: Not Currently  Other Topics Concern   Not on file  Social History Narrative   Not on file   Social Determinants of  Health   Financial Resource Strain: Not on file  Food Insecurity: Not on file  Transportation Needs: Not on file  Physical Activity: Not on file  Stress: Not on file  Social Connections: Not on file  Intimate Partner Violence: Not on file    Family History  Problem Relation Age of Onset   Hypertension Mother    Cancer Maternal Grandmother    Leukemia Paternal Grandmother    Cancer Maternal Aunt        unk type   Breast cancer Neg Hx      Current Outpatient Medications:    cholecalciferol (VITAMIN D3) 25 MCG (1000 UNIT) tablet, Take 1,000 Units by mouth daily., Disp: , Rfl:    dorzolamide-timolol (COSOPT) 22.3-6.8 MG/ML ophthalmic solution, Administer 1 drop to both eyes Two (2) times a day., Disp: , Rfl:    DULoxetine (CYMBALTA) 30 MG capsule, TAKE (2) CAPSULES BY MOUTH ONCE DAILY., Disp: 180 capsule, Rfl: 0   hydrochlorothiazide (HYDRODIURIL) 25 MG tablet, Take  25 mg by mouth daily., Disp: , Rfl:    losartan (COZAAR) 100 MG tablet, Take 100 mg by mouth daily., Disp: , Rfl:    pantoprazole (PROTONIX) 20 MG tablet, Take 1 tablet (20 mg total) by mouth daily., Disp: 30 tablet, Rfl: 3   potassium chloride SA (KLOR-CON) 20 MEQ tablet, TAKE (1) TABLET BY MOUTH twice  DAILY, Disp: 60 tablet, Rfl: 5   timolol (TIMOPTIC) 0.5 % ophthalmic solution, Place 1 drop into both eyes daily. , Disp: , Rfl:    ibuprofen (ADVIL) 800 MG tablet, Take 1 tablet (800 mg total) by mouth every 8 (eight) hours as needed. (Patient not taking: Reported on 04/22/2020), Disp: 30 tablet, Rfl: 0   latanoprost (XALATAN) 0.005 % ophthalmic solution, Place 1 drop into both eyes at bedtime. , Disp: , Rfl:    loperamide (IMODIUM A-D) 2 MG tablet, Take 2 mg by mouth 4 (four) times daily as needed for diarrhea or loose stools. (Patient not taking: Reported on 04/22/2020), Disp: , Rfl:    LORazepam (ATIVAN) 0.5 MG tablet, Take 1 tablet (0.5 mg total) by mouth every 6 (six) hours as needed (Nausea or vomiting). (Patient not  taking: Reported on 04/22/2020), Disp: 30 tablet, Rfl: 0   ondansetron (ZOFRAN) 8 MG tablet, Take 1 tablet (8 mg total) by mouth 2 (two) times daily as needed. Start on the third day after chemotherapy. (Patient not taking: Reported on 04/22/2020), Disp: 30 tablet, Rfl: 1   prochlorperazine (COMPAZINE) 10 MG tablet, Take 1 tablet (10 mg total) by mouth every 6 (six) hours as needed (Nausea or vomiting). (Patient not taking: Reported on 04/22/2020), Disp: 30 tablet, Rfl: 1 No current facility-administered medications for this visit.  Facility-Administered Medications Ordered in Other Visits:    sodium chloride flush (NS) 0.9 % injection 10 mL, 10 mL, Intravenous, PRN, Creig Hines, MD, 10 mL at 10/09/19 0815  Physical exam:  Vitals:   08/15/21 1121  BP: (!) 143/90  Pulse: 73  Resp: 16  Temp: (!) 97 F (36.1 C)  SpO2: 98%  Weight: 184 lb 9.6 oz (83.7 kg)   Physical Exam Constitutional:      General: She is not in acute distress. Cardiovascular:     Rate and Rhythm: Normal rate and regular rhythm.     Heart sounds: Normal heart sounds.  Pulmonary:     Effort: Pulmonary effort is normal.     Breath sounds: Normal breath sounds.  Abdominal:     General: Bowel sounds are normal.     Palpations: Abdomen is soft.  Skin:    General: Skin is warm and dry.  Neurological:     Mental Status: She is alert and oriented to person, place, and time.    Chest wall exam: Patient is s/p bilateral mastectomy without reconstruction.  No evidence of chest wall recurrence.  No palpable bilateral axillary adenopathy.    Latest Ref Rng & Units 08/15/2021   10:55 AM  CMP  Glucose 70 - 99 mg/dL 132    BUN 8 - 23 mg/dL 14    Creatinine 4.40 - 1.00 mg/dL 1.02    Sodium 725 - 366 mmol/L 133    Potassium 3.5 - 5.1 mmol/L 3.7    Chloride 98 - 111 mmol/L 99    CO2 22 - 32 mmol/L 28    Calcium 8.9 - 10.3 mg/dL 9.5    Total Protein 6.5 - 8.1 g/dL 8.1    Total Bilirubin 0.3 - 1.2 mg/dL 0.7  Alkaline Phos 38  - 126 U/L 91    AST 15 - 41 U/L 16    ALT 0 - 44 U/L 14        Latest Ref Rng & Units 08/15/2021   10:55 AM  CBC  WBC 4.0 - 10.5 K/uL 7.1    Hemoglobin 12.0 - 15.0 g/dL 44.0    Hematocrit 10.2 - 46.0 % 39.8    Platelets 150 - 400 K/uL 255       Assessment and plan- Patient is a 66 y.o. female clinical prognostic stage Ia invasive mammary carcinoma mcT1 ccN0 cM0 ER/PR negative and HER-2/neu negative.  She is s/p neoadjuvant dose dense AC chemotherapy followed by 12 weekly cycles of carbotaxol chemotherapy.  She is here for a routine follow-up visit of breast cancer  Clinically patient is doing well with no concerning signs and symptoms of recurrence based on today's exam.  No role for any routine surveillance scans and since patient has already undergone a bilateral mastectomy there is no role for any surveillance mammograms at this time.  She is now nearing 2 years since completing treatment for her breast cancer.  I will see her back in 6 months no labs   Visit Diagnosis 1. Encounter for follow-up surveillance of breast cancer      Dr. Owens Shark, MD, MPH [facility] at [facility] [phone] 08/15/2021 3:59 PM

## 2021-08-15 NOTE — Progress Notes (Signed)
Survivorship Care Plan visit completed.  Treatment summary reviewed and given to patient.  ASCO answers booklet reviewed and given to patient.  CARE program and Cancer Transitions discussed with patient along with other resources cancer center offers to patients and caregivers.  Patient verbalized understanding.    

## 2021-09-19 ENCOUNTER — Other Ambulatory Visit: Payer: Self-pay | Admitting: Oncology

## 2021-09-19 NOTE — Telephone Encounter (Signed)
Component Ref Range & Units 1 mo ago (08/15/21) 7 mo ago (02/15/21) 1 yr ago (08/19/20) 1 yr ago (04/22/20) 1 yr ago (01/08/20) 1 yr ago (11/27/19) 1 yr ago (11/12/19)  Potassium 3.5 - 5.1 mmol/L 3.7  3.2 Low   3.5  3.3 Low   3.1 Low   3.5  3.6

## 2021-09-20 ENCOUNTER — Encounter: Payer: Self-pay | Admitting: Oncology

## 2021-10-26 ENCOUNTER — Other Ambulatory Visit: Payer: Self-pay | Admitting: Oncology

## 2021-10-26 NOTE — Telephone Encounter (Signed)
Component Ref Range & Units 2 mo ago (08/15/21) 8 mo ago (02/15/21) 1 yr ago (08/19/20) 1 yr ago (04/22/20) 1 yr ago (01/08/20) 1 yr ago (11/27/19) 1 yr ago (11/12/19)  Potassium 3.5 - 5.1 mmol/L 3.7  3.2 Low   3.5  3.3 Low   3.1 Low   3.5  3.6

## 2021-11-01 ENCOUNTER — Other Ambulatory Visit: Payer: Self-pay | Admitting: Oncology

## 2021-11-07 ENCOUNTER — Other Ambulatory Visit: Payer: Self-pay

## 2021-11-28 IMAGING — MG MM DIGITAL DIAGNOSTIC UNILAT*L* W/ TOMO W/ CAD
4 series · 4 of 12 positions shown · non-contrast
Comparison: 03/07/2019 and earlier

CLINICAL DATA: Patient returns after screening study for evaluation
of possible LEFT breast mass. Patient has a history of LEFT
lumpectomy with radiation treatment in 6886.

EXAM:
DIGITAL DIAGNOSTIC LEFT MAMMOGRAM WITH CAD AND TOMO
ULTRASOUND LEFT BREAST

[L MLO synth-2D]
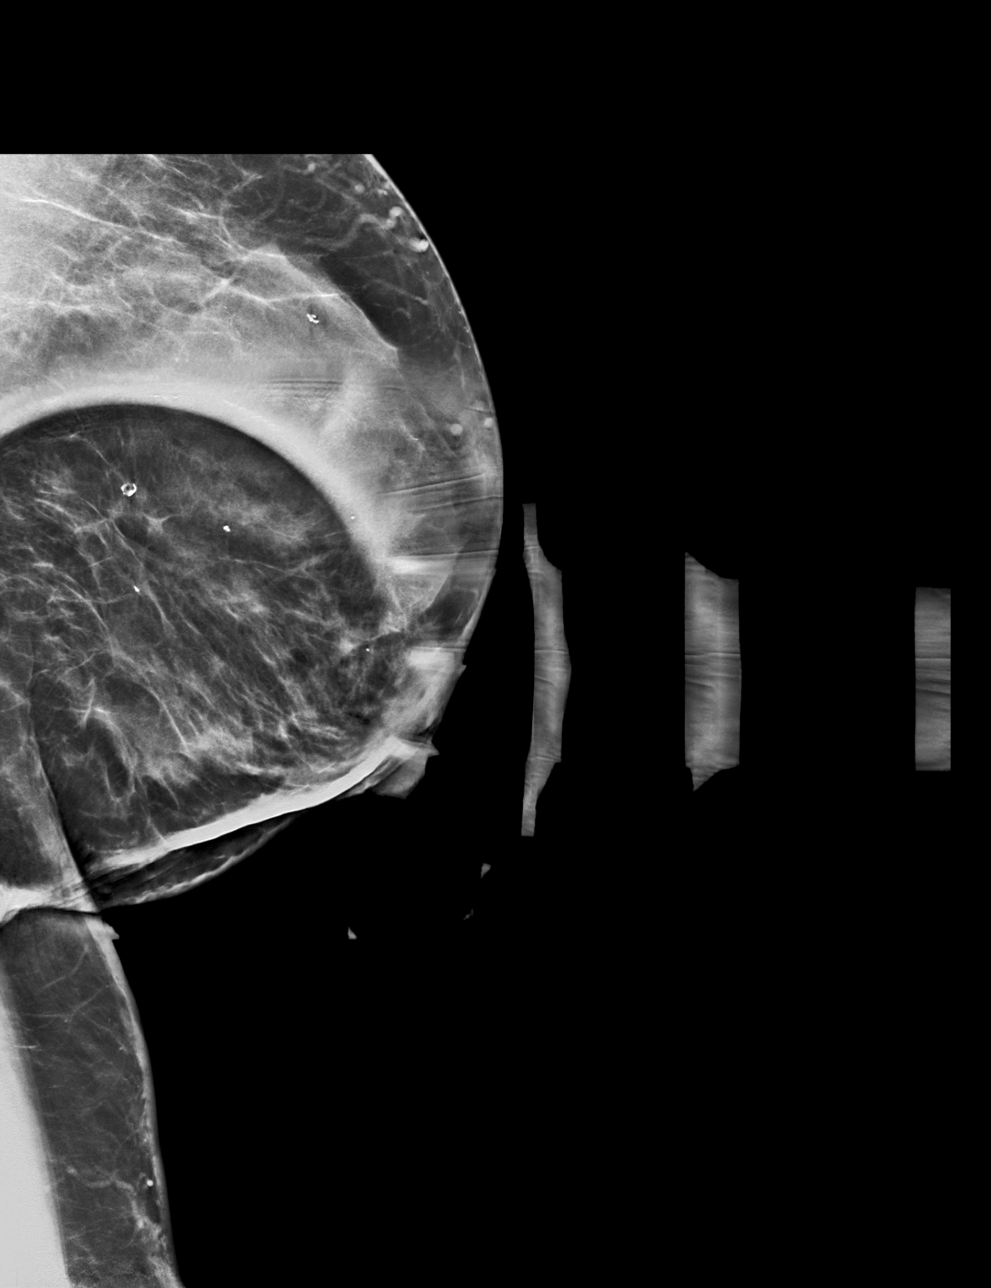

[L CC synth-2D]
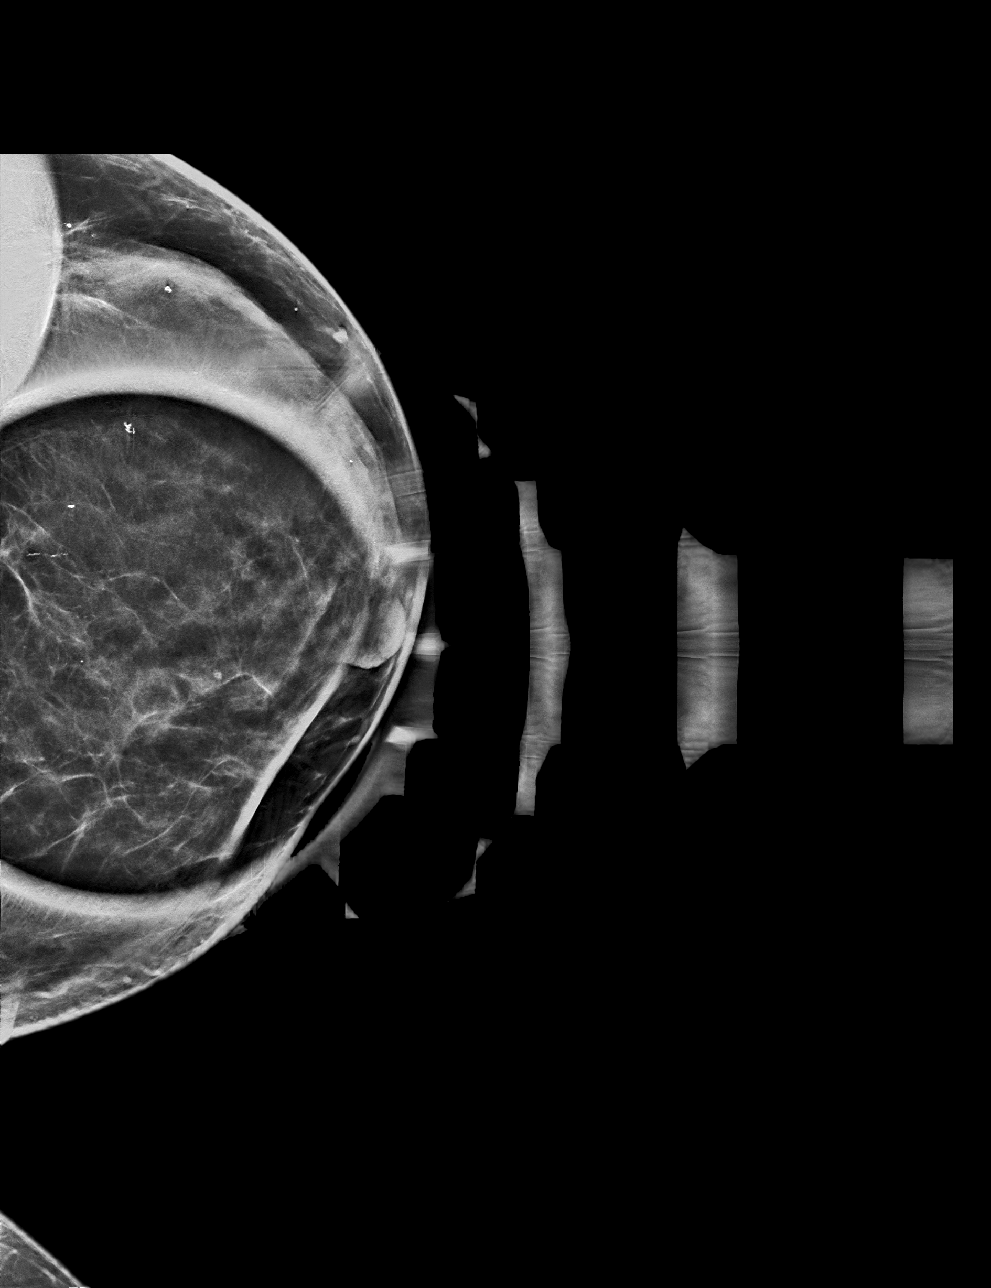

[L MLO tomo · tomo slice 29/58.0]
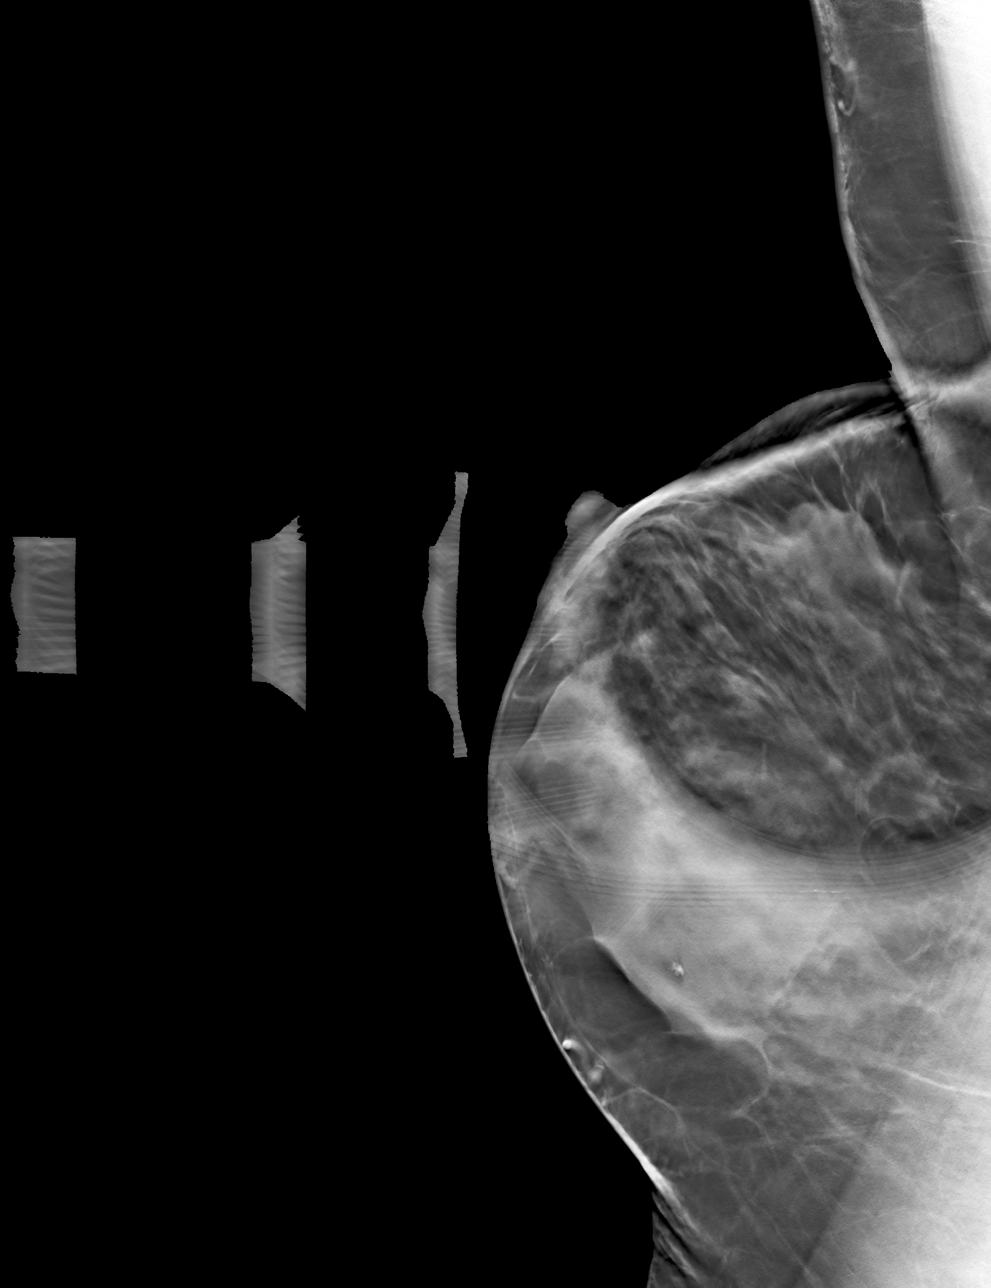

[L CC tomo · tomo slice 27/52.0]
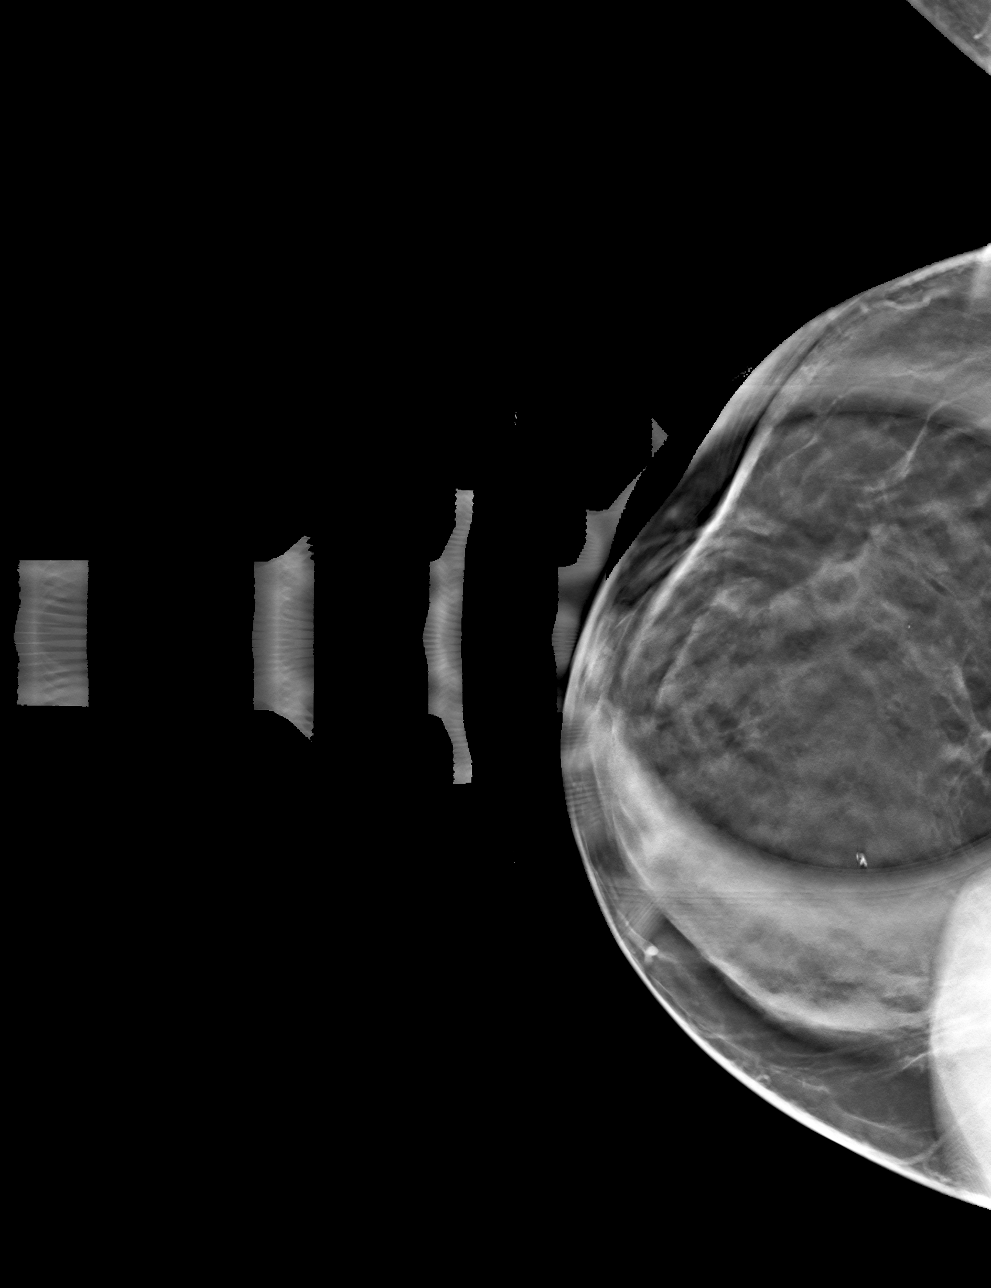

[4 of 12 positions shown; findings below may reference images not displayed]

ACR Breast Density Category c: The breast tissue is heterogeneously
dense, which may obscure small masses.
FINDINGS: Additional 2-D and 3-D images are performed. These views confirm
presence of an irregular mass in the LOWER INNER QUADRANT of the
LEFT breast.

Mammographic images were processed with CAD.

On physical exam, there is a well-healed lumpectomy scar in the 10
o'clock location of the LEFT breast. There is a visible palpable
mass in the 630-7 o'clock location of the LEFT breast.

Targeted ultrasound is performed, showing irregular hypoechoic
heterogeneous mass with irregular margins in the 630-10 o'clock
location of the LEFT breast 3 centimeters from the nipple
corresponding to the palpable mass. Mass measures 1.4 x 1.6 x
centimeters. Internal blood flow is identified on Doppler
evaluation.

Evaluation of the LEFT axilla shows no adenopathy.
IMPRESSION: Suspicious 1.6 centimeter mass in the 630-7 o'clock location of the
LEFT breast, near the lumpectomy scar.

No LEFT axillary adenopathy.

RECOMMENDATION:
Ultrasound-guided core biopsy of LEFT breast mass.

I have discussed the findings and recommendations with the patient.
If applicable, a reminder letter will be sent to the patient
regarding the next appointment.

BI-RADS CATEGORY  4: Suspicious.

## 2021-12-07 ENCOUNTER — Other Ambulatory Visit: Payer: Self-pay | Admitting: *Deleted

## 2021-12-07 MED ORDER — POTASSIUM CHLORIDE CRYS ER 20 MEQ PO TBCR: 20.0000 meq | EXTENDED_RELEASE_TABLET | Freq: Two times a day (BID) | ORAL | 0 refills | Status: DC

## 2021-12-07 NOTE — Telephone Encounter (Signed)
Next Appointment 02/21/22   Component Ref Range & Units 3 mo ago (08/15/21) 9 mo ago (02/15/21) 1 yr ago (08/19/20) 1 yr ago (04/22/20) 1 yr ago (01/08/20) 2 yr ago (11/27/19) 2 yr ago (11/12/19)  Potassium 3.5 - 5.1 mmol/L 3.7  3.2 Low   3.5  3.3 Low   3.1 Low

## 2021-12-14 ENCOUNTER — Other Ambulatory Visit: Payer: Self-pay

## 2021-12-29 IMAGING — MG US BREAST BX W LOC DEV 1ST LESION IMG BX SPEC US GUIDE*L*
1 series · 8 of 8 positions shown · non-contrast
Comparison: Previous exam(s).
COMPARISON: Previous exam(s).

Addendum:
CLINICAL DATA: Left breast mass.

EXAM:
ULTRASOUND GUIDED LEFT BREAST CORE NEEDLE BIOPSY

[Series 1: MG view · 0.06mm/px · 8 of 24 slices shown]
[im 1/24]
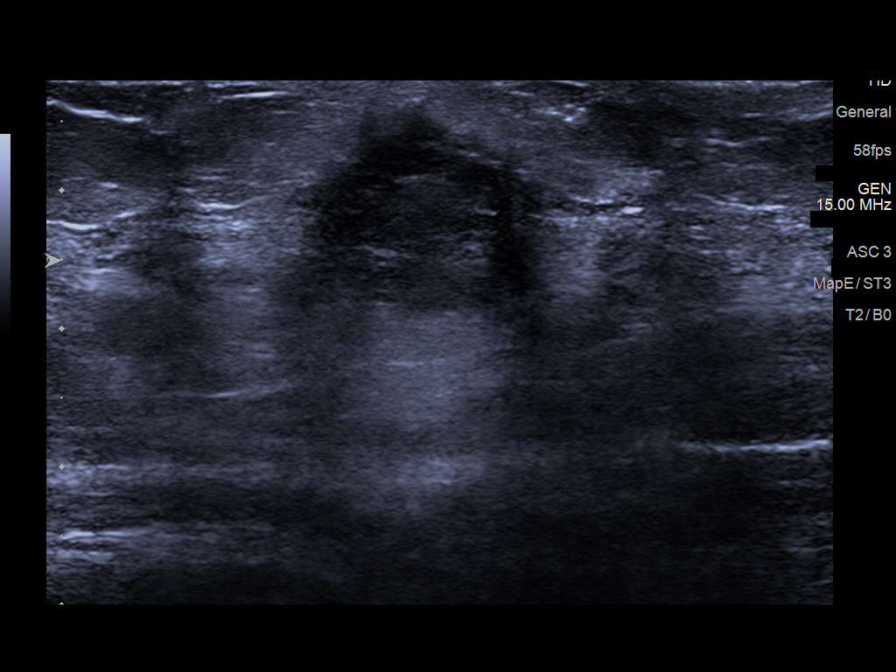
[im 4/24]
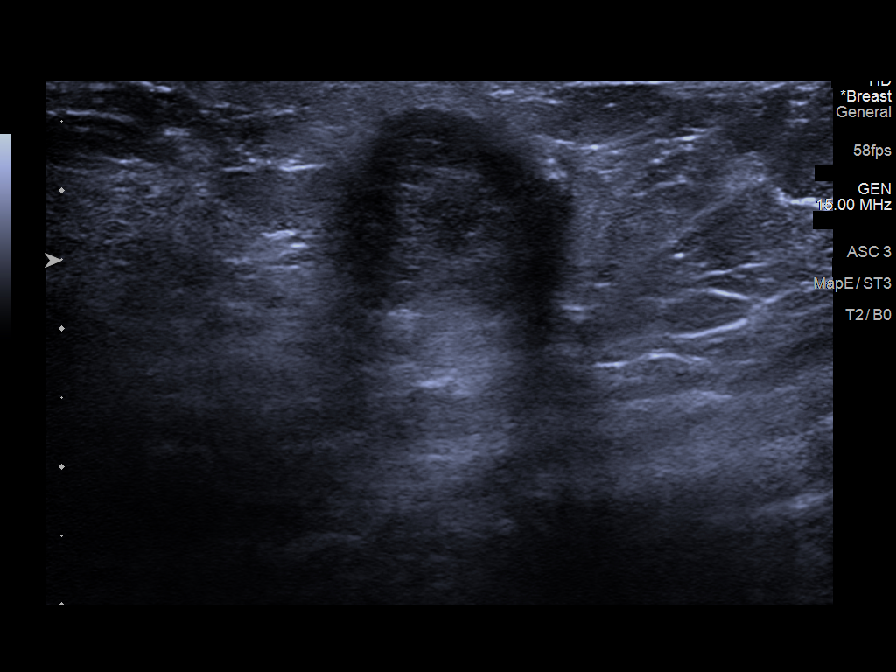
[im 7/24]
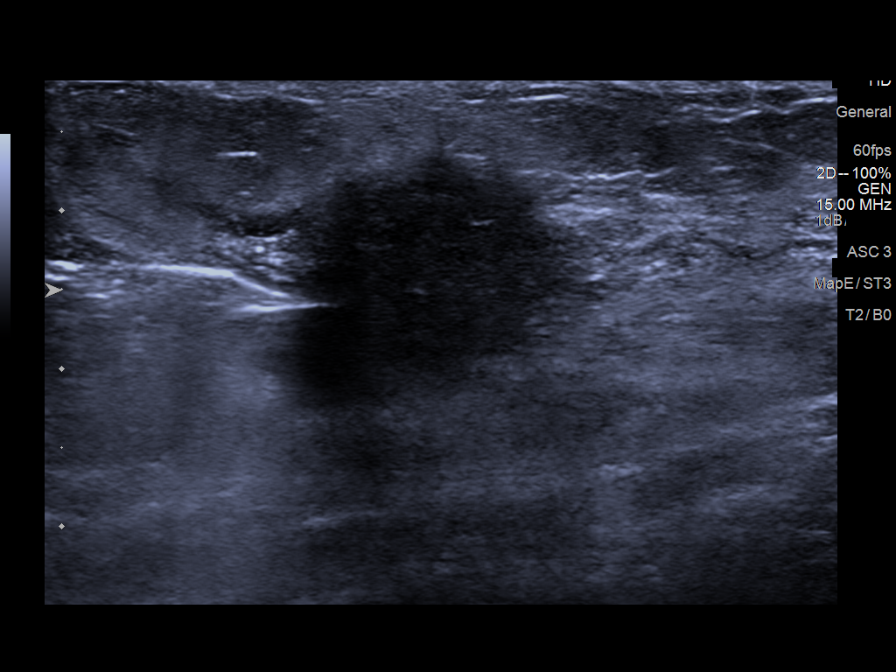
[im 10/24]
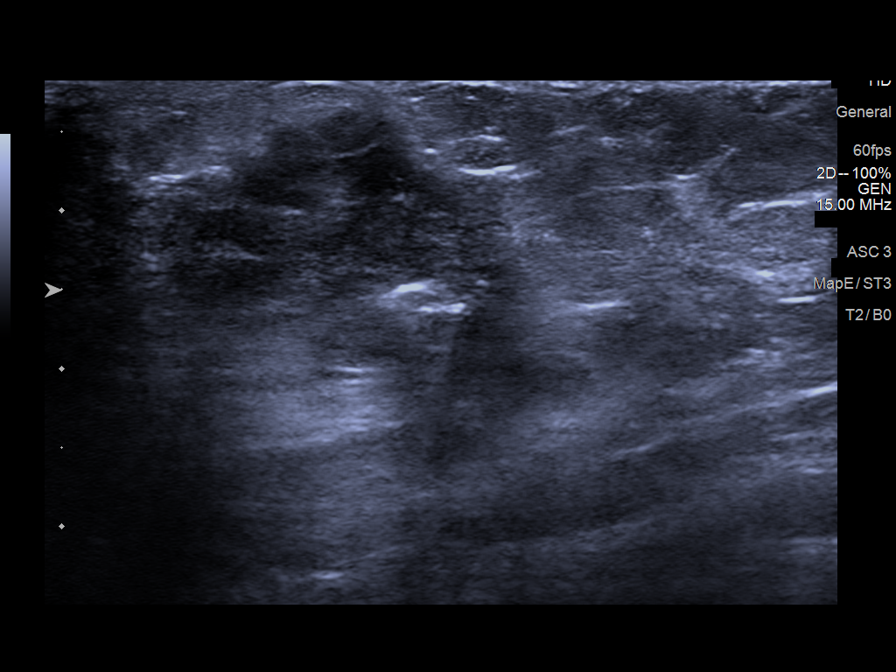
[im 14/24]
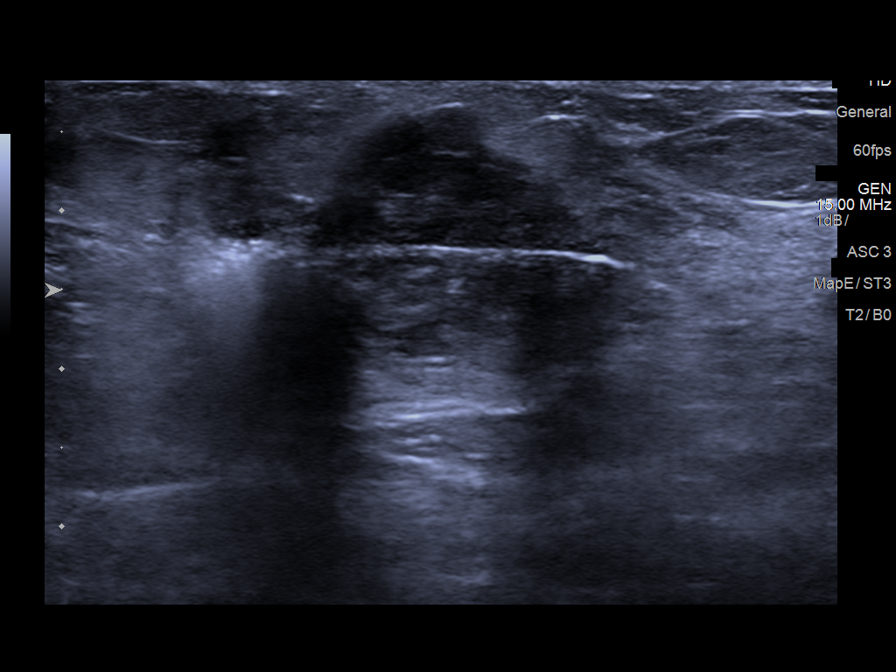
[im 17/24]
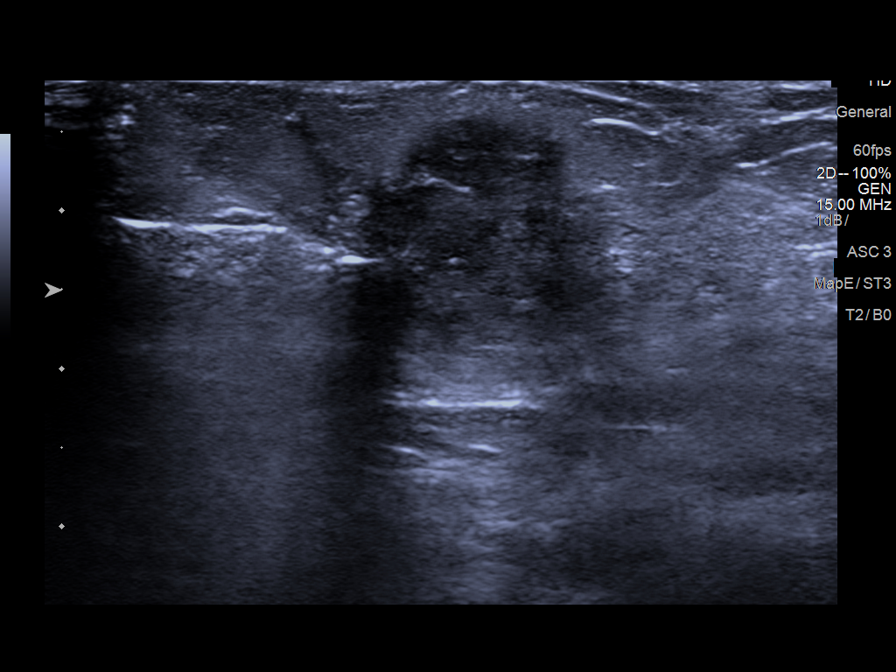
[im 20/24]
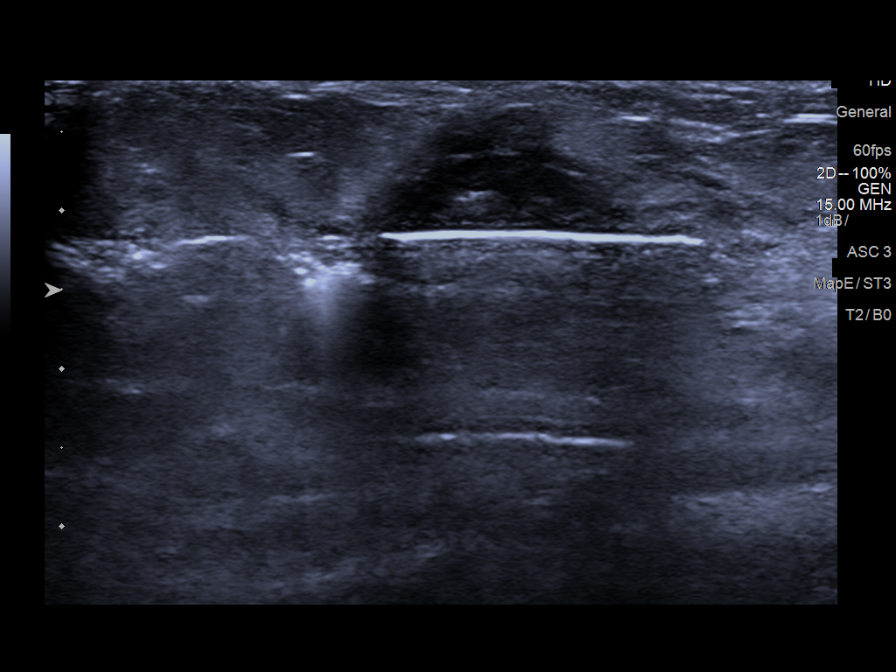
[im 24/24]
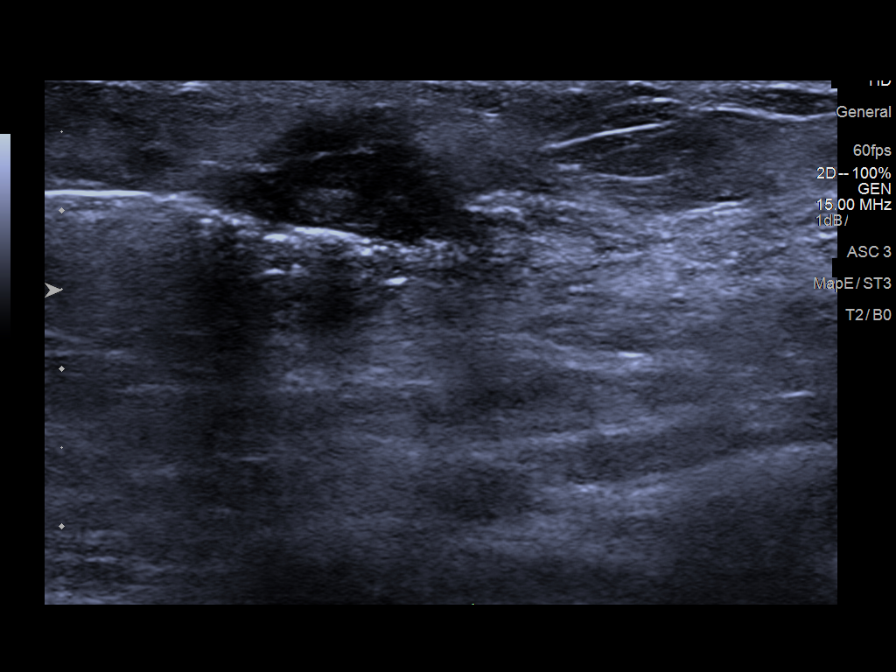

[8 of 8 positions shown; findings below may reference images not displayed]



Lesion quadrant: Lower inner quadrant

Using sterile technique and 1% lidocaine and 1% lidocaine with
epinephrine as local anesthetic, under direct ultrasound
visualization, a 12 gauge Auchombit device was used to perform
biopsy of a mass in the [DATE] region of the left breast using a
lateral to medial approach. At the conclusion of the procedure
venous shaped tissue marker clip was deployed into the biopsy
cavity. Follow up 2 view mammogram was performed and dictated
separately.
IMPRESSION: Ultrasound guided biopsy of the left breast. No apparent
complications.

ADDENDUM:
PATHOLOGY revealed: A. BREAST, LEFT, [DATE]; ULTRASOUND-GUIDED CORE
BIOPSY: - INVASIVE MAMMARY CARCINOMA, NO SPECIAL TYPE. 12 mm in this
sample : Grade 3. Ductal carcinoma in situ: Not identified
Lymphovascular invasion: Not identified.

Pathology results are CONCORDANT with imaging findings, per Dr. Jumper
Meimei.

Pathology results and recommendations were discussed with patient by
telephone on 05/06/2019. Patient reported biopsy site doing well with
slight tenderness at the site. Post biopsy care instructions were
reviewed and questions were answered. Patient was instructed to call
[HOSPITAL] if any concerns or questions arise related to
the biopsy.

Recommendation: Surgical referral. Request for surgical referral was
relayed to nurse navigators at [HOSPITAL] [HOSPITAL] by
Adrian Walter Reuque RN on 05/06/2019.

Addendum by Adrian Walter Reuque RN on 05/07/2019.



Lesion quadrant: Lower inner quadrant

Using sterile technique and 1% lidocaine and 1% lidocaine with
epinephrine as local anesthetic, under direct ultrasound
visualization, a 12 gauge Auchombit device was used to perform
biopsy of a mass in the [DATE] region of the left breast using a
lateral to medial approach. At the conclusion of the procedure
venous shaped tissue marker clip was deployed into the biopsy
cavity. Follow up 2 view mammogram was performed and dictated
separately.
IMPRESSION: Ultrasound guided biopsy of the left breast. No apparent
complications.

## 2022-01-16 ENCOUNTER — Other Ambulatory Visit: Payer: Self-pay | Admitting: *Deleted

## 2022-01-16 MED ORDER — POTASSIUM CHLORIDE CRYS ER 20 MEQ PO TBCR: 20.0000 meq | EXTENDED_RELEASE_TABLET | Freq: Two times a day (BID) | ORAL | 0 refills | Status: DC

## 2022-01-16 NOTE — Telephone Encounter (Signed)
Component Ref Range & Units 5 mo ago (08/15/21) 11 mo ago (02/15/21) 1 yr ago (08/19/20) 1 yr ago (04/22/20) 2 yr ago (01/08/20) 2 yr ago (11/27/19) 2 yr ago (11/12/19)  Potassium 3.5 - 5.1 mmol/L 3.7  3.2 Low   3.5  3.3 Low   3.1 Low   3.5  3.6

## 2022-02-15 ENCOUNTER — Ambulatory Visit: Payer: BC Managed Care – PPO | Admitting: Oncology

## 2022-02-15 ENCOUNTER — Other Ambulatory Visit: Payer: BC Managed Care – PPO

## 2022-02-21 ENCOUNTER — Inpatient Hospital Stay (HOSPITAL_BASED_OUTPATIENT_CLINIC_OR_DEPARTMENT_OTHER): Payer: BC Managed Care – PPO | Admitting: Oncology

## 2022-02-21 ENCOUNTER — Inpatient Hospital Stay: Payer: BC Managed Care – PPO | Attending: Oncology

## 2022-02-21 ENCOUNTER — Encounter: Payer: Self-pay | Admitting: Oncology

## 2022-02-21 VITALS — BP 150/88 | HR 63 | Temp 96.9°F | Resp 16 | Wt 175.1 lb

## 2022-02-21 DIAGNOSIS — Z853 Personal history of malignant neoplasm of breast: Secondary | ICD-10-CM | POA: Insufficient documentation

## 2022-02-21 DIAGNOSIS — Z9013 Acquired absence of bilateral breasts and nipples: Secondary | ICD-10-CM | POA: Diagnosis not present

## 2022-02-21 DIAGNOSIS — Z806 Family history of leukemia: Secondary | ICD-10-CM | POA: Diagnosis not present

## 2022-02-21 DIAGNOSIS — Z08 Encounter for follow-up examination after completed treatment for malignant neoplasm: Secondary | ICD-10-CM

## 2022-02-21 DIAGNOSIS — I10 Essential (primary) hypertension: Secondary | ICD-10-CM | POA: Diagnosis not present

## 2022-02-21 DIAGNOSIS — Z9221 Personal history of antineoplastic chemotherapy: Secondary | ICD-10-CM | POA: Diagnosis not present

## 2022-02-21 DIAGNOSIS — Z90710 Acquired absence of both cervix and uterus: Secondary | ICD-10-CM | POA: Insufficient documentation

## 2022-02-21 DIAGNOSIS — Z923 Personal history of irradiation: Secondary | ICD-10-CM | POA: Diagnosis not present

## 2022-02-21 DIAGNOSIS — E876 Hypokalemia: Secondary | ICD-10-CM

## 2022-02-21 DIAGNOSIS — Z87891 Personal history of nicotine dependence: Secondary | ICD-10-CM | POA: Insufficient documentation

## 2022-02-21 LAB — CBC WITH DIFFERENTIAL/PLATELET
Abs Immature Granulocytes: 0.01 10*3/uL (ref 0.00–0.07)
Basophils Absolute: 0 10*3/uL (ref 0.0–0.1)
Basophils Relative: 0 %
Eosinophils Absolute: 0.1 10*3/uL (ref 0.0–0.5)
Eosinophils Relative: 1 %
HCT: 39 % (ref 36.0–46.0)
Hemoglobin: 12.9 g/dL (ref 12.0–15.0)
Immature Granulocytes: 0 %
Lymphocytes Relative: 47 %
Lymphs Abs: 2.9 10*3/uL (ref 0.7–4.0)
MCH: 28.3 pg (ref 26.0–34.0)
MCHC: 33.1 g/dL (ref 30.0–36.0)
MCV: 85.5 fL (ref 80.0–100.0)
Monocytes Absolute: 0.6 10*3/uL (ref 0.1–1.0)
Monocytes Relative: 10 %
Neutro Abs: 2.6 10*3/uL (ref 1.7–7.7)
Neutrophils Relative %: 42 %
Platelets: 235 10*3/uL (ref 150–400)
RBC: 4.56 MIL/uL (ref 3.87–5.11)
RDW: 12.9 % (ref 11.5–15.5)
WBC: 6.2 10*3/uL (ref 4.0–10.5)
nRBC: 0 % (ref 0.0–0.2)

## 2022-02-21 LAB — COMPREHENSIVE METABOLIC PANEL
ALT: 25 U/L (ref 0–44)
AST: 20 U/L (ref 15–41)
Albumin: 4 g/dL (ref 3.5–5.0)
Alkaline Phosphatase: 113 U/L (ref 38–126)
Anion gap: 9 (ref 5–15)
BUN: 12 mg/dL (ref 8–23)
CO2: 26 mmol/L (ref 22–32)
Calcium: 9.4 mg/dL (ref 8.9–10.3)
Chloride: 100 mmol/L (ref 98–111)
Creatinine, Ser: 0.81 mg/dL (ref 0.44–1.00)
GFR, Estimated: >60 mL/min (ref >60)
Glucose, Bld: 90 mg/dL (ref 70–99)
Potassium: 3.4 mmol/L — ABNORMAL LOW (ref 3.5–5.1)
Sodium: 135 mmol/L (ref 135–145)
Total Bilirubin: 0.8 mg/dL (ref 0.3–1.2)
Total Protein: 7.9 g/dL (ref 6.5–8.1)

## 2022-02-21 MED ORDER — POTASSIUM CHLORIDE CRYS ER 20 MEQ PO TBCR: 20.0000 meq | EXTENDED_RELEASE_TABLET | Freq: Two times a day (BID) | ORAL | 0 refills | Status: DC

## 2022-02-21 MED ORDER — DULOXETINE HCL 30 MG PO CPEP: 60.0000 mg | ORAL_CAPSULE | Freq: Every day | ORAL | 0 refills | Status: DC

## 2022-02-21 NOTE — Progress Notes (Signed)
Patient has 8.9 wt loss since 08/2021 despite a good appetite.  Neuropathy stable on Cymbalta.

## 2022-02-21 NOTE — Progress Notes (Signed)
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  She has had prior breast cancer in 2001 s/p lumpectomy followed by adjuvant radiation treatment.  She did not require adjuvant chemotherapy.  She does not remember taking any endocrine therapy as well.More recently patient underwent a bilateral screening mammogram in November 2020 which showed a possible mass in the left breast.  This was followed by a diagnostic mammogram and ultrasound which showed a 1.6 cm mass at the 7 o'clock position in the left breast near the lumpectomy scar.  No left axillary adenopathy.  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    MRI bilateral breast showed:The size of the primary left breast mass appears larger on MRI close to 2.4 x 1.9 x 1.7 cm.  She is also noted to have 2 additional enhancing masses 9 x 6 x 10 mm and 5 x 5 x 5 mm in the left breast with suspicious washout kinetics.  No pathological left axillary adenopathy.  Prominent lymph nodes seen on the right side along with non-mass enhancement spanning 5.3 cm in the AP dimension.  Both the  enhancing masses in the left breast were positive for triple negative breast cancer.  Right sided axillary lymph node was negative for malignancy.  Right breast biopsy showed DCIS    Patient has completed neoadjuvant dose dense AC chemotherapy and 12 weekly cycles of carbotaxol chemotherapy on 10/16/2019.    Patient underwent bilateral mastectomy without reconstruction.  Residual 10 mm of carcinoma was seen in the left breast.  No evidence of malignancy in the right breast.  No evidence of residual in situ carcinoma in the right breast.  One sentinel lymph node on the left and three sentinel lymph nodes on the right were negative for malignancy.  Overall cancer cellularity was 10%.  Adjuvant Xeloda was offered and patient declined it.    genetic testing negative.     Interval history-she is doing well overall.  Symptoms of neuropathy wax and wane but overall stable on Cymbalta.  Appetite and weight have remained stable.  Denies any new aches and pains anywhere  ECOG PS- 1 Pain scale- 0   Review of systems- Review of Systems  Constitutional:  Negative for chills, fever, malaise/fatigue and weight loss.  HENT:  Negative for congestion, ear discharge and nosebleeds.   Eyes:  Negative for blurred vision.  Respiratory:  Negative for cough, hemoptysis, sputum production, shortness of breath and wheezing.   Cardiovascular:  Negative for chest pain, palpitations, orthopnea and claudication.  Gastrointestinal:  Negative for abdominal pain, blood in stool, constipation, diarrhea, heartburn, melena, nausea and vomiting.  Genitourinary:  Negative for dysuria, flank pain, frequency, hematuria and urgency.  Musculoskeletal:  Negative for back pain, joint pain  and myalgias.  Skin:  Negative for rash.  Neurological:  Negative for dizziness, tingling, focal weakness, seizures, weakness and headaches.  Endo/Heme/Allergies:  Does not bruise/bleed easily.  Psychiatric/Behavioral:  Negative for depression and  suicidal ideas. The patient does not have insomnia.       No Known Allergies   Past Medical History:  Diagnosis Date   Breast cancer ([place]) 2001   left breast lumpectomy and rad tx   Breast cancer ([place]) 02/2019   left breast mass w/ biopsy   Breast cancer ([place]) 2021   right DCIS   Family history of cancer    GERD (gastroesophageal reflux disease)    History of breast cancer    Hypertension    Personal history of radiation therapy 2001   BREAST CA     Past Surgical History:  Procedure Laterality Date   ABDOMINAL HYSTERECTOMY     BREAST BIOPSY Left 2001   POS   BREAST BIOPSY Left 2021   positive   BREAST BIOPSY Right 2021   DCIS   BREAST LUMPECTOMY Left 2001   PORT-A-CATH REMOVAL N/A 11/11/2019   Procedure: REMOVAL PORT-A-CATH;  Surgeon: Jules Husbands, MD;  Location: [facility] ORS;  Service: General;  Laterality: N/A;   PORTACATH PLACEMENT Right 05/16/2019   Procedure: INSERTION PORT-A-CATH;  Surgeon: Jules Husbands, MD;  Location: [facility] ORS;  Service: General;  Laterality: Right;   SIMPLE MASTECTOMY WITH AXILLARY SENTINEL NODE BIOPSY Bilateral 11/11/2019   Procedure: SIMPLE MASTECTOMY WITH AXILLARY SENTINEL NODE BIOPSY;  Surgeon: Jules Husbands, MD;  Location: [facility] ORS;  Service: General;  Laterality: Bilateral;    Social History   Socioeconomic History   Marital status: Divorced    Spouse name: Not on file   Number of children: Not on file   Years of education: Not on file   Highest education level: Not on file  Occupational History   Not on file  Tobacco Use   Smoking status: Former    Packs/day: 0.50    Years: 5.00    Total pack years: 2.50    Types: Cigarettes    Quit date: 1970    Years since quitting: 53.8   Smokeless tobacco: Never  Vaping Use   Vaping Use: Never used  Substance and Sexual Activity   Alcohol use: Never   Drug use: Never   Sexual activity: Not Currently  Other Topics Concern   Not on file  Social History Narrative   Not on file    Social Determinants of Health   Financial Resource Strain: Not on file  Food Insecurity: Not on file  Transportation Needs: Not on file  Physical Activity: Not on file  Stress: Not on file  Social Connections: Not on file  Intimate Partner Violence: Not on file    Family History  Problem Relation Age of Onset   Hypertension Mother    Cancer Maternal Grandmother    Leukemia Paternal Grandmother    Cancer Maternal Aunt        unk type   Breast cancer Neg Hx      Current Outpatient Medications:    dorzolamide-timolol (COSOPT) 22.3-6.8 MG/ML ophthalmic solution, Administer 1 drop to both eyes Two (2) times a day., Disp: , Rfl:    hydrochlorothiazide (HYDRODIURIL) 25 MG tablet, Take 25 mg by mouth daily., Disp: , Rfl:    latanoprost (XALATAN) 0.005 % ophthalmic solution, Place 1 drop into both eyes at bedtime. , Disp: , Rfl:    losartan (COZAAR) 100  MG tablet, Take 100 mg by mouth daily., Disp: , Rfl:    pantoprazole (PROTONIX) 20 MG tablet, Take 1 tablet (20 mg total) by mouth daily., Disp: 30 tablet, Rfl: 3   timolol (TIMOPTIC) 0.5 % ophthalmic solution, Place 1 drop into both eyes daily. , Disp: , Rfl:    cholecalciferol (VITAMIN D3) 25 MCG (1000 UNIT) tablet, Take 1,000 Units by mouth daily. (Patient not taking: Reported on 02/21/2022), Disp: , Rfl:    DULoxetine (CYMBALTA) 30 MG capsule, Take 2 capsules (60 mg total) by mouth daily., Disp: 180 capsule, Rfl: 0   ibuprofen (ADVIL) 800 MG tablet, Take 1 tablet (800 mg total) by mouth every 8 (eight) hours as needed. (Patient not taking: Reported on 04/22/2020), Disp: 30 tablet, Rfl: 0   loperamide (IMODIUM A-D) 2 MG tablet, Take 2 mg by mouth 4 (four) times daily as needed for diarrhea or loose stools. (Patient not taking: Reported on 04/22/2020), Disp: , Rfl:    LORazepam (ATIVAN) 0.5 MG tablet, Take 1 tablet (0.5 mg total) by mouth every 6 (six) hours as needed (Nausea or vomiting). (Patient not taking: Reported on 04/22/2020), Disp: 30  tablet, Rfl: 0   ondansetron (ZOFRAN) 8 MG tablet, Take 1 tablet (8 mg total) by mouth 2 (two) times daily as needed. Start on the third day after chemotherapy. (Patient not taking: Reported on 04/22/2020), Disp: 30 tablet, Rfl: 1   potassium chloride SA (KLOR-CON M) 20 MEQ tablet, Take 1 tablet (20 mEq total) by mouth 2 (two) times daily., Disp: 60 tablet, Rfl: 0   prochlorperazine (COMPAZINE) 10 MG tablet, Take 1 tablet (10 mg total) by mouth every 6 (six) hours as needed (Nausea or vomiting). (Patient not taking: Reported on 04/22/2020), Disp: 30 tablet, Rfl: 1 No current facility-administered medications for this visit.  Facility-Administered Medications Ordered in Other Visits:    sodium chloride flush (NS) 0.9 % injection 10 mL, 10 mL, Intravenous, PRN, Sindy Guadeloupe, MD, 10 mL at 10/09/19 0815  Physical exam:  Vitals:   02/21/22 1000  BP: (!) 150/88  Pulse: 63  Resp: 16  Temp: (!) 96.9 F (36.1 C)  TempSrc: Tympanic  Weight: 175 lb 1.6 oz (79.4 kg)   Physical Exam Constitutional:      General: She is not in acute distress. Cardiovascular:     Rate and Rhythm: Normal rate and regular rhythm.     Heart sounds: Normal heart sounds.  Pulmonary:     Effort: Pulmonary effort is normal.     Breath sounds: Normal breath sounds.  Abdominal:     General: Bowel sounds are normal.     Palpations: Abdomen is soft.  Skin:    General: Skin is warm and dry.  Neurological:     Mental Status: She is alert and oriented to person, place, and time.   Chest wall exam: Patient is s/p bilateral mastectomy without reconstruction.  No evidence of chest wall recurrence.  No palpable bilateral axillary adenopathy.     Latest Ref Rng & Units 02/21/2022   10:19 AM  CMP  Glucose 70 - 99 mg/dL 90   BUN 8 - 23 mg/dL 12   Creatinine 0.44 - 1.00 mg/dL 0.81   Sodium 135 - 145 mmol/L 135   Potassium 3.5 - 5.1 mmol/L 3.4   Chloride 98 - 111 mmol/L 100   CO2 22 - 32 mmol/L 26   Calcium 8.9 - 10.3 mg/dL  9.4   Total Protein 6.5 - 8.1 g/dL 7.9  Total Bilirubin 0.3 - 1.2 mg/dL 0.8   Alkaline Phos 38 - 126 U/L 113   AST 15 - 41 U/L 20   ALT 0 - 44 U/L 25       Latest Ref Rng & Units 02/21/2022   10:19 AM  CBC  WBC 4.0 - 10.5 K/uL 6.2   Hemoglobin 12.0 - 15.0 g/dL 12.9   Hematocrit 36.0 - 46.0 % 39.0   Platelets 150 - 400 K/uL 235      Assessment and plan- Patient is a 66 y.o. female  clinical prognostic stage Ia invasive mammary carcinoma mcT1 ccN0 cM0 ER/PR negative and HER-2/neu negative.  She is s/p neoadjuvant dose dense AC chemotherapy followed by 12 weekly cycles of carbotaxol chemotherapy.  She is here for routine follow-up  Chest wall exam is unremarkable today with no evidence of recurrence.  No palpable bilateral axillary adenopathy.  No overt signs and symptoms of lymphedema.  Labs are overall unremarkable other than chronic mild hypokalemia.  I will see her back in 6 months no labs.  I am renewing her oral potassium today   Visit Diagnosis 1. Encounter for follow-up surveillance of breast cancer   2. Hypokalemia      Dr. Randa Evens, MD, MPH [facility] at [facility] [phone] 02/21/2022 5:06 PM

## 2022-04-25 ENCOUNTER — Other Ambulatory Visit: Payer: Self-pay | Admitting: Oncology

## 2022-04-25 NOTE — Telephone Encounter (Signed)
Component Ref Range & Units 2 mo ago (02/21/22) 8 mo ago (08/15/21) 1 yr ago (02/15/21) 1 yr ago (08/19/20) 2 yr ago (04/22/20) 2 yr ago (01/08/20) 2 yr ago (11/27/19)  Potassium 3.5 - 5.1 mmol/L 3.4 Low  3.7 3.2 Low  3.5 3.3 Low  3.1 Low  3.5

## 2022-06-14 ENCOUNTER — Other Ambulatory Visit: Payer: Self-pay | Admitting: Oncology

## 2022-06-14 NOTE — Telephone Encounter (Signed)
Component Ref Range & Units 3 mo ago (02/21/22) 10 mo ago (08/15/21) 1 yr ago (02/15/21) 1 yr ago (08/19/20) 2 yr ago (04/22/20) 2 yr ago (01/08/20) 2 yr ago (11/27/19)  Potassium 3.5 - 5.1 mmol/L 3.4 Low  3.7 3.2 Low  3.5 3.3 Low  3.1 Low  3.5

## 2022-08-14 ENCOUNTER — Encounter: Payer: Self-pay | Admitting: Oncology

## 2022-08-14 ENCOUNTER — Other Ambulatory Visit: Payer: Self-pay | Admitting: Oncology

## 2022-08-16 ENCOUNTER — Encounter: Payer: Self-pay | Admitting: Oncology

## 2022-08-16 NOTE — Telephone Encounter (Signed)
Next appointment 5/6, but no labs ordered           Component Ref Range & Units 5 mo ago (02/21/22) 1 yr ago (08/15/21) 1 yr ago (02/15/21) 1 yr ago (08/19/20) 2 yr ago (04/22/20) 2 yr ago (01/08/20) 2 yr ago (11/27/19)  Potassium 3.5 - 5.1 mmol/L 3.4 Low  3.7 3.2 Low  3.5 3.3 Low  3.1 Low  3.5

## 2022-08-21 ENCOUNTER — Encounter: Payer: Self-pay | Admitting: Oncology

## 2022-08-21 ENCOUNTER — Inpatient Hospital Stay: Payer: Medicare HMO | Attending: Oncology | Admitting: Oncology

## 2022-08-21 ENCOUNTER — Other Ambulatory Visit: Payer: Self-pay | Admitting: *Deleted

## 2022-08-21 VITALS — BP 147/91 | HR 77 | Temp 97.3°F | Resp 18 | Ht 66.0 in | Wt 181.3 lb

## 2022-08-21 DIAGNOSIS — Z87891 Personal history of nicotine dependence: Secondary | ICD-10-CM | POA: Diagnosis not present

## 2022-08-21 DIAGNOSIS — Z806 Family history of leukemia: Secondary | ICD-10-CM | POA: Insufficient documentation

## 2022-08-21 DIAGNOSIS — T451X5A Adverse effect of antineoplastic and immunosuppressive drugs, initial encounter: Secondary | ICD-10-CM | POA: Diagnosis not present

## 2022-08-21 DIAGNOSIS — Z9013 Acquired absence of bilateral breasts and nipples: Secondary | ICD-10-CM | POA: Insufficient documentation

## 2022-08-21 DIAGNOSIS — Z853 Personal history of malignant neoplasm of breast: Secondary | ICD-10-CM | POA: Insufficient documentation

## 2022-08-21 DIAGNOSIS — Z923 Personal history of irradiation: Secondary | ICD-10-CM | POA: Diagnosis not present

## 2022-08-21 DIAGNOSIS — E876 Hypokalemia: Secondary | ICD-10-CM | POA: Insufficient documentation

## 2022-08-21 DIAGNOSIS — Z90710 Acquired absence of both cervix and uterus: Secondary | ICD-10-CM | POA: Insufficient documentation

## 2022-08-21 DIAGNOSIS — G620 Drug-induced polyneuropathy: Secondary | ICD-10-CM

## 2022-08-21 DIAGNOSIS — Z9221 Personal history of antineoplastic chemotherapy: Secondary | ICD-10-CM | POA: Insufficient documentation

## 2022-08-21 DIAGNOSIS — Z08 Encounter for follow-up examination after completed treatment for malignant neoplasm: Secondary | ICD-10-CM

## 2022-08-21 MED ORDER — DULOXETINE HCL 30 MG PO CPEP: 60.0000 mg | ORAL_CAPSULE | Freq: Every day | ORAL | 2 refills | Status: DC

## 2022-08-21 MED ORDER — POTASSIUM CHLORIDE CRYS ER 20 MEQ PO TBCR: 20.0000 meq | EXTENDED_RELEASE_TABLET | Freq: Two times a day (BID) | ORAL | 0 refills | Status: DC

## 2022-08-21 NOTE — Progress Notes (Signed)
Hematology/Oncology Consult note [facility]  Telephone:([phone] [phone]  Patient Care Team: Quentin Cornwall, MD as PCP - General (Internal Medicine) Jim Like, RN as Registered Nurse Scarlett Presto, RN (Inactive) as Registered Nurse Creig Hines, MD as Consulting Physician (Oncology) Leafy Ro, MD as Consulting Physician (General Surgery)   Name of the patient: Molly Walters  [id number]  [birth year]   Date of visit: 08/21/22  Diagnosis-  clinical prognostic stage Ia invasive mammary carcinoma cmT1 ccN0 cM0 ER/PR negative and HER-2/neu negative s/p neoadjuvant chemotherapy and bilateral mastectomy    Chief complaint/ Reason for visit-routine follow-up visit for breast cancer  Heme/Onc history:  patient is a 67 year old African-American female with past medical history significant for hypertension.  She has had prior breast cancer in 2001 s/p lumpectomy followed by adjuvant radiation treatment.  She did not require adjuvant chemotherapy.  She does not remember taking any endocrine therapy as well.More recently patient underwent a bilateral screening mammogram in November 2020 which showed a possible mass in the left breast.  This was followed by a diagnostic mammogram and ultrasound which showed a 1.6 cm mass at the 7 o'clock position in the left breast near the lumpectomy scar.  No left axillary adenopathy.  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    MRI bilateral breast showed:The size of the primary left breast mass appears larger on MRI close to 2.4 x 1.9 x 1.7 cm.  She is also noted to have 2 additional enhancing masses 9 x 6 x 10 mm and 5 x 5 x 5 mm in the left breast with suspicious washout kinetics.  No pathological left axillary adenopathy.  Prominent lymph nodes seen on the right side along with non-mass enhancement spanning 5.3 cm in the AP dimension.  Both  the enhancing masses in the left breast were positive for triple negative breast cancer.  Right sided axillary lymph node was negative for malignancy.  Right breast biopsy showed DCIS    Patient has completed neoadjuvant dose dense AC chemotherapy and 12 weekly cycles of carbotaxol chemotherapy on 10/16/2019.    Patient underwent bilateral mastectomy without reconstruction.  Residual 10 mm of carcinoma was seen in the left breast.  No evidence of malignancy in the right breast.  No evidence of residual in situ carcinoma in the right breast.  One sentinel lymph node on the left and three sentinel lymph nodes on the right were negative for malignancy.  Overall cancer cellularity was 10%.  Adjuvant Xeloda was offered and patient declined it.    genetic testing negative.     Interval history-patient has chronic symptoms of chemo induced neuropathy which are stable with Cymbalta.  Appetite and weight have remained stable.  Denies any new aches and pains anywhere.  She also takes oral potassium daily for chronic hypokalemia.  Has longstanding knee pain for which she has undergone right knee surgery in the past.  ECOG PS- 1 Pain scale- 3 Opioid associated constipation- no  Review of systems- Review of Systems  Constitutional:  Negative for chills, fever, malaise/fatigue and weight loss.  HENT:  Negative for congestion, ear discharge and nosebleeds.   Eyes:  Negative for blurred vision.  Respiratory:  Negative for cough, hemoptysis, sputum production, shortness of breath and wheezing.   Cardiovascular:  Negative for chest pain, palpitations, orthopnea and claudication.  Gastrointestinal:  Negative for abdominal pain, blood in stool, constipation,  diarrhea, heartburn, melena, nausea and vomiting.  Genitourinary:  Negative for dysuria, flank pain, frequency, hematuria and urgency.  Musculoskeletal:  Negative for back pain, joint pain and myalgias.  Skin:  Negative for rash.  Neurological:  Negative for  dizziness, tingling, focal weakness, seizures, weakness and headaches.  Endo/Heme/Allergies:  Does not bruise/bleed easily.  Psychiatric/Behavioral:  Negative for depression and suicidal ideas. The patient does not have insomnia.       No Known Allergies   Past Medical History:  Diagnosis Date   Breast cancer (HCC) 2001   left breast lumpectomy and rad tx   Breast cancer (HCC) 02/2019   left breast mass w/ biopsy   Breast cancer (HCC) 2021   right DCIS   Family history of cancer    GERD (gastroesophageal reflux disease)    History of breast cancer    Hypertension    Personal history of radiation therapy 2001   BREAST CA     Past Surgical History:  Procedure Laterality Date   ABDOMINAL HYSTERECTOMY     BREAST BIOPSY Left 2001   POS   BREAST BIOPSY Left 2021   positive   BREAST BIOPSY Right 2021   DCIS   BREAST LUMPECTOMY Left 2001   PORT-A-CATH REMOVAL N/A 11/11/2019   Procedure: REMOVAL PORT-A-CATH;  Surgeon: Leafy Ro, MD;  Location: [facility] ORS;  Service: General;  Laterality: N/A;   PORTACATH PLACEMENT Right 05/16/2019   Procedure: INSERTION PORT-A-CATH;  Surgeon: Leafy Ro, MD;  Location: [facility] ORS;  Service: General;  Laterality: Right;   SIMPLE MASTECTOMY WITH AXILLARY SENTINEL NODE BIOPSY Bilateral 11/11/2019   Procedure: SIMPLE MASTECTOMY WITH AXILLARY SENTINEL NODE BIOPSY;  Surgeon: Leafy Ro, MD;  Location: [facility] ORS;  Service: General;  Laterality: Bilateral;    Social History   Socioeconomic History   Marital status: Divorced    Spouse name: Not on file   Number of children: Not on file   Years of education: Not on file   Highest education level: Not on file  Occupational History   Not on file  Tobacco Use   Smoking status: Former    Packs/day: 0.50    Years: 5.00    Additional pack years: 0.00    Total pack years: 2.50    Types: Cigarettes    Quit date: 1970    Years since quitting: 54.3   Smokeless tobacco: Never  Vaping Use    Vaping Use: Never used  Substance and Sexual Activity   Alcohol use: Never   Drug use: Never   Sexual activity: Not Currently  Other Topics Concern   Not on file  Social History Narrative   Not on file   Social Determinants of Health   Financial Resource Strain: Not on file  Food Insecurity: Not on file  Transportation Needs: Not on file  Physical Activity: Not on file  Stress: Not on file  Social Connections: Not on file  Intimate Partner Violence: Not on file    Family History  Problem Relation Age of Onset   Hypertension Mother    Cancer Maternal Grandmother    Leukemia Paternal Grandmother    Cancer Maternal Aunt        unk type   Breast cancer Neg Hx      Current Outpatient Medications:    dorzolamide-timolol (COSOPT) 22.3-6.8 MG/ML ophthalmic solution, Administer 1 drop to both eyes Two (2) times a day., Disp: , Rfl:    hydrochlorothiazide (HYDRODIURIL) 25 MG tablet, Take 25  mg by mouth daily., Disp: , Rfl:    latanoprost (XALATAN) 0.005 % ophthalmic solution, Place 1 drop into both eyes at bedtime. , Disp: , Rfl:    losartan (COZAAR) 100 MG tablet, Take 100 mg by mouth daily., Disp: , Rfl:    pantoprazole (PROTONIX) 20 MG tablet, Take 1 tablet (20 mg total) by mouth daily., Disp: 30 tablet, Rfl: 3   timolol (TIMOPTIC) 0.5 % ophthalmic solution, Place 1 drop into both eyes daily. , Disp: , Rfl:    cholecalciferol (VITAMIN D3) 25 MCG (1000 UNIT) tablet, Take 1,000 Units by mouth daily. (Patient not taking: Reported on 02/21/2022), Disp: , Rfl:    DULoxetine (CYMBALTA) 30 MG capsule, Take 2 capsules (60 mg total) by mouth daily., Disp: 180 capsule, Rfl: 2   ibuprofen (ADVIL) 800 MG tablet, Take 1 tablet (800 mg total) by mouth every 8 (eight) hours as needed. (Patient not taking: Reported on 04/22/2020), Disp: 30 tablet, Rfl: 0   loperamide (IMODIUM A-D) 2 MG tablet, Take 2 mg by mouth 4 (four) times daily as needed for diarrhea or loose stools. (Patient not taking:  Reported on 04/22/2020), Disp: , Rfl:    LORazepam (ATIVAN) 0.5 MG tablet, Take 1 tablet (0.5 mg total) by mouth every 6 (six) hours as needed (Nausea or vomiting). (Patient not taking: Reported on 04/22/2020), Disp: 30 tablet, Rfl: 0   ondansetron (ZOFRAN) 8 MG tablet, Take 1 tablet (8 mg total) by mouth 2 (two) times daily as needed. Start on the third day after chemotherapy. (Patient not taking: Reported on 04/22/2020), Disp: 30 tablet, Rfl: 1   potassium chloride SA (KLOR-CON M) 20 MEQ tablet, Take 1 tablet (20 mEq total) by mouth 2 (two) times daily., Disp: 60 tablet, Rfl: 0   prochlorperazine (COMPAZINE) 10 MG tablet, Take 1 tablet (10 mg total) by mouth every 6 (six) hours as needed (Nausea or vomiting). (Patient not taking: Reported on 04/22/2020), Disp: 30 tablet, Rfl: 1 No current facility-administered medications for this visit.  Facility-Administered Medications Ordered in Other Visits:    sodium chloride flush (NS) 0.9 % injection 10 mL, 10 mL, Intravenous, PRN, Creig Hines, MD, 10 mL at 10/09/19 0815  Physical exam:  Vitals:   08/21/22 1308 08/21/22 1315  BP: (!) 158/88 (!) 147/91  Pulse: 75 77  Resp: 18   Temp: (!) 97.3 F (36.3 C)   TempSrc: Tympanic   SpO2: 100%   Weight: 181 lb 4.8 oz (82.2 kg)   Height: 5\' 6"  (1.676 m)    Physical Exam Cardiovascular:     Rate and Rhythm: Normal rate and regular rhythm.     Heart sounds: Normal heart sounds.  Pulmonary:     Effort: Pulmonary effort is normal.     Breath sounds: Normal breath sounds.  Abdominal:     General: Bowel sounds are normal.     Palpations: Abdomen is soft.  Skin:    General: Skin is warm and dry.  Neurological:     Mental Status: She is alert and oriented to person, place, and time.   Chest wall exam: Patient is s/p bilateral mastectomy without reconstruction.  No evidence of chest wall recurrence.  No palpable bilateral axillary adenopathy.     Latest Ref Rng & Units 02/21/2022   10:19 AM  CMP   Glucose 70 - 99 mg/dL 90   BUN 8 - 23 mg/dL 12   Creatinine 1.61 - 1.00 mg/dL 0.96   Sodium 045 - 409 mmol/L 135  Potassium 3.5 - 5.1 mmol/L 3.4   Chloride 98 - 111 mmol/L 100   CO2 22 - 32 mmol/L 26   Calcium 8.9 - 10.3 mg/dL 9.4   Total Protein 6.5 - 8.1 g/dL 7.9   Total Bilirubin 0.3 - 1.2 mg/dL 0.8   Alkaline Phos 38 - 126 U/L 113   AST 15 - 41 U/L 20   ALT 0 - 44 U/L 25       Latest Ref Rng & Units 02/21/2022   10:19 AM  CBC  WBC 4.0 - 10.5 K/uL 6.2   Hemoglobin 12.0 - 15.0 g/dL 96.2   Hematocrit 95.2 - 46.0 % 39.0   Platelets 150 - 400 K/uL 235      Assessment and plan- Patient is a 67 y.o. female  clinical prognostic stage Ia invasive mammary carcinoma mcT1 ccN0 cM0 ER/PR negative and HER-2/neu negative.  She is s/p neoadjuvant dose dense AC chemotherapy followed by 12 weekly cycles of carbotaxol chemotherapy.  This is a routine follow-up visit for breast cancer  We are nearing 3 years of surveillance completion.  Patient had her surgery in July 2021.  Clinically she is doing well with no concerning signs and symptoms of recurrence based on today's exam.  No evidence of chest wall recurrence.  No role for any routine surveillance imaging at this time.  I will see her back in 6 months with CBC with differential and CMP.  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 Encounter for follow-up surveillance of breast cancer   2. Hypokalemia   3. Chemotherapy-induced peripheral neuropathy (HCC)      Dr. Owens Shark, MD, MPH [facility] [phone] 08/21/2022 1:53 PM

## 2022-08-22 ENCOUNTER — Ambulatory Visit: Payer: BC Managed Care – PPO | Admitting: Oncology

## 2022-10-10 ENCOUNTER — Encounter: Payer: Self-pay | Admitting: Oncology

## 2022-12-11 ENCOUNTER — Other Ambulatory Visit: Payer: Self-pay | Admitting: Oncology

## 2022-12-11 NOTE — Telephone Encounter (Signed)
Component Ref Range & Units 9 mo ago (02/21/22) 1 yr ago (08/15/21) 1 yr ago (02/15/21) 2 yr ago (08/19/20) 2 yr ago (04/22/20) 2 yr ago (01/08/20) 3 yr ago (11/27/19)  Potassium 3.5 - 5.1 mmol/L 3.4 Low  3.7 3.2 Low

## 2023-02-19 ENCOUNTER — Other Ambulatory Visit: Payer: Self-pay | Admitting: Oncology

## 2023-02-21 ENCOUNTER — Encounter: Payer: Self-pay | Admitting: Oncology

## 2023-02-21 NOTE — Telephone Encounter (Signed)
Component Ref Range & Units 1 yr ago (02/21/22) 1 yr ago (08/15/21) 2 yr ago (02/15/21) 2 yr ago (08/19/20) 2 yr ago (04/22/20) 3 yr ago (01/08/20) 3 yr ago (11/27/19)  Potassium 3.5 - 5.1 mmol/L 3.4 Low  3.7 3.2 Low  3.5 3.3 Low  3.1 Low  3.5

## 2023-02-23 ENCOUNTER — Inpatient Hospital Stay: Payer: Medicare HMO | Attending: Oncology

## 2023-02-23 ENCOUNTER — Encounter: Payer: Self-pay | Admitting: Oncology

## 2023-02-23 ENCOUNTER — Other Ambulatory Visit: Payer: Self-pay | Admitting: *Deleted

## 2023-02-23 ENCOUNTER — Inpatient Hospital Stay: Payer: Medicare HMO | Admitting: Oncology

## 2023-02-23 VITALS — BP 150/85 | HR 84 | Temp 97.6°F | Resp 20 | Wt 179.7 lb

## 2023-02-23 DIAGNOSIS — Z923 Personal history of irradiation: Secondary | ICD-10-CM | POA: Insufficient documentation

## 2023-02-23 DIAGNOSIS — Z08 Encounter for follow-up examination after completed treatment for malignant neoplasm: Secondary | ICD-10-CM

## 2023-02-23 DIAGNOSIS — Z87891 Personal history of nicotine dependence: Secondary | ICD-10-CM | POA: Diagnosis not present

## 2023-02-23 DIAGNOSIS — Z853 Personal history of malignant neoplasm of breast: Secondary | ICD-10-CM | POA: Insufficient documentation

## 2023-02-23 DIAGNOSIS — Z9013 Acquired absence of bilateral breasts and nipples: Secondary | ICD-10-CM | POA: Insufficient documentation

## 2023-02-23 DIAGNOSIS — G620 Drug-induced polyneuropathy: Secondary | ICD-10-CM

## 2023-02-23 DIAGNOSIS — Z9221 Personal history of antineoplastic chemotherapy: Secondary | ICD-10-CM | POA: Diagnosis not present

## 2023-02-23 DIAGNOSIS — E876 Hypokalemia: Secondary | ICD-10-CM | POA: Insufficient documentation

## 2023-02-23 DIAGNOSIS — M25561 Pain in right knee: Secondary | ICD-10-CM | POA: Diagnosis not present

## 2023-02-23 DIAGNOSIS — Z806 Family history of leukemia: Secondary | ICD-10-CM | POA: Diagnosis not present

## 2023-02-23 DIAGNOSIS — G8929 Other chronic pain: Secondary | ICD-10-CM | POA: Insufficient documentation

## 2023-02-23 DIAGNOSIS — T451X5A Adverse effect of antineoplastic and immunosuppressive drugs, initial encounter: Secondary | ICD-10-CM

## 2023-02-23 DIAGNOSIS — Z90710 Acquired absence of both cervix and uterus: Secondary | ICD-10-CM | POA: Diagnosis not present

## 2023-02-23 LAB — CBC WITH DIFFERENTIAL/PLATELET
Abs Immature Granulocytes: 0.03 10*3/uL (ref 0.00–0.07)
Basophils Absolute: 0 10*3/uL (ref 0.0–0.1)
Basophils Relative: 0 %
Eosinophils Absolute: 0.1 10*3/uL (ref 0.0–0.5)
Eosinophils Relative: 1 %
HCT: 35.8 % — ABNORMAL LOW (ref 36.0–46.0)
Hemoglobin: 12 g/dL (ref 12.0–15.0)
Immature Granulocytes: 1 %
Lymphocytes Relative: 40 %
Lymphs Abs: 2.3 10*3/uL (ref 0.7–4.0)
MCH: 28.4 pg (ref 26.0–34.0)
MCHC: 33.5 g/dL (ref 30.0–36.0)
MCV: 84.8 fL (ref 80.0–100.0)
Monocytes Absolute: 0.7 10*3/uL (ref 0.1–1.0)
Monocytes Relative: 12 %
Neutro Abs: 2.7 10*3/uL (ref 1.7–7.7)
Neutrophils Relative %: 46 %
Platelets: 225 10*3/uL (ref 150–400)
RBC: 4.22 MIL/uL (ref 3.87–5.11)
RDW: 12.8 % (ref 11.5–15.5)
WBC: 5.9 10*3/uL (ref 4.0–10.5)
nRBC: 0 % (ref 0.0–0.2)

## 2023-02-23 LAB — COMPREHENSIVE METABOLIC PANEL
ALT: 18 U/L (ref 0–44)
AST: 16 U/L (ref 15–41)
Albumin: 4 g/dL (ref 3.5–5.0)
Alkaline Phosphatase: 101 U/L (ref 38–126)
Anion gap: 7 (ref 5–15)
BUN: 14 mg/dL (ref 8–23)
CO2: 25 mmol/L (ref 22–32)
Calcium: 9.2 mg/dL (ref 8.9–10.3)
Chloride: 102 mmol/L (ref 98–111)
Creatinine, Ser: 1.08 mg/dL — ABNORMAL HIGH (ref 0.44–1.00)
GFR, Estimated: 57 mL/min — ABNORMAL LOW (ref >60)
Glucose, Bld: 101 mg/dL — ABNORMAL HIGH (ref 70–99)
Potassium: 3.1 mmol/L — ABNORMAL LOW (ref 3.5–5.1)
Sodium: 134 mmol/L — ABNORMAL LOW (ref 135–145)
Total Bilirubin: 0.8 mg/dL (ref <1.2)
Total Protein: 7.5 g/dL (ref 6.5–8.1)

## 2023-02-23 MED ORDER — POTASSIUM CHLORIDE CRYS ER 20 MEQ PO TBCR: 20.0000 meq | EXTENDED_RELEASE_TABLET | Freq: Two times a day (BID) | ORAL | 0 refills | Status: DC

## 2023-02-23 MED ORDER — DULOXETINE HCL 30 MG PO CPEP: 60.0000 mg | ORAL_CAPSULE | Freq: Every day | ORAL | 2 refills | Status: DC

## 2023-02-24 ENCOUNTER — Encounter: Payer: Self-pay | Admitting: Oncology

## 2023-02-24 NOTE — Progress Notes (Signed)
Hematology/Oncology Consult note [facility]  Telephone:([phone] [phone]  Patient Care Team: Quentin Cornwall, MD as PCP - General (Internal Medicine) Jim Like, RN as Registered Nurse Scarlett Presto, RN (Inactive) as Registered Nurse Creig Hines, MD as Consulting Physician (Oncology) Leafy Ro, MD as Consulting Physician (General Surgery)   Name of the patient: Molly Walters  [id number]  06/16/1955   Date of visit: 02/24/23  Diagnosis-  clinical prognostic stage Ia invasive mammary carcinoma cmT1 ccN0 cM0 ER/PR negative and HER-2/neu negative s/p neoadjuvant chemotherapy and bilateral mastectomy   Chief complaint/ Reason for visit-routine follow-up of breast cancer  Heme/Onc history: patient is a 67 year old African-American female with past medical history significant for hypertension.  She has had prior breast cancer in 2001 s/p lumpectomy followed by adjuvant radiation treatment.  She did not require adjuvant chemotherapy.  She does not remember taking any endocrine therapy as well.More recently patient underwent a bilateral screening mammogram in November 2020 which showed a possible mass in the left breast.  This was followed by a diagnostic mammogram and ultrasound which showed a 1.6 cm mass at the 7 o'clock position in the left breast near the lumpectomy scar.  No left axillary adenopathy.  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    MRI bilateral breast showed:The size of the primary left breast mass appears larger on MRI close to 2.4 x 1.9 x 1.7 cm.  She is also noted to have 2 additional enhancing masses 9 x 6 x 10 mm and 5 x 5 x 5 mm in the left breast with suspicious washout kinetics.  No pathological left axillary adenopathy.  Prominent lymph nodes seen on the right side along with non-mass enhancement spanning 5.3 cm in the AP dimension.  Both the  enhancing masses in the left breast were positive for triple negative breast cancer.  Right sided axillary lymph node was negative for malignancy.  Right breast biopsy showed DCIS    Patient has completed neoadjuvant dose dense AC chemotherapy and 12 weekly cycles of carbotaxol chemotherapy on 10/16/2019.    Patient underwent bilateral mastectomy without reconstruction.  Residual 10 mm of carcinoma was seen in the left breast.  No evidence of malignancy in the right breast.  No evidence of residual in situ carcinoma in the right breast.  One sentinel lymph node on the left and three sentinel lymph nodes on the right were negative for malignancy.  Overall cancer cellularity was 10%.  Adjuvant Xeloda was offered and patient declined it.    genetic testing negative.   Interval history-denies any upper extremity pain or swelling.  Denies any new aches and pains anywhere.  She has a chronic right knee pain for which she wears a brace  ECOG PS- 1 Pain scale- 0   Review of systems- Review of Systems  Constitutional:  Negative for chills, fever, malaise/fatigue and weight loss.  HENT:  Negative for congestion, ear discharge and nosebleeds.   Eyes:  Negative for blurred vision.  Respiratory:  Negative for cough, hemoptysis, sputum production, shortness of breath and wheezing.   Cardiovascular:  Negative for chest pain, palpitations, orthopnea and claudication.  Gastrointestinal:  Negative for abdominal pain, blood in stool, constipation, diarrhea, heartburn, melena, nausea and vomiting.  Genitourinary:  Negative for dysuria, flank pain, frequency, hematuria and urgency.  Musculoskeletal:  Negative for back pain, joint pain and myalgias.  Skin:  Negative  for rash.  Neurological:  Negative for dizziness, tingling, focal weakness, seizures, weakness and headaches.  Endo/Heme/Allergies:  Does not bruise/bleed easily.  Psychiatric/Behavioral:  Negative for depression and suicidal ideas. The patient does not  have insomnia.       No Known Allergies   Past Medical History:  Diagnosis Date   Breast cancer (HCC) 2001   left breast lumpectomy and rad tx   Breast cancer (HCC) 02/2019   left breast mass w/ biopsy   Breast cancer (HCC) 2021   right DCIS   Family history of cancer    GERD (gastroesophageal reflux disease)    History of breast cancer    Hypertension    Personal history of radiation therapy 2001   BREAST CA     Past Surgical History:  Procedure Laterality Date   ABDOMINAL HYSTERECTOMY     BREAST BIOPSY Left 2001   POS   BREAST BIOPSY Left 2021   positive   BREAST BIOPSY Right 2021   DCIS   BREAST LUMPECTOMY Left 2001   PORT-A-CATH REMOVAL N/A 11/11/2019   Procedure: REMOVAL PORT-A-CATH;  Surgeon: Leafy Ro, MD;  Location: [facility] ORS;  Service: General;  Laterality: N/A;   PORTACATH PLACEMENT Right 05/16/2019   Procedure: INSERTION PORT-A-CATH;  Surgeon: Leafy Ro, MD;  Location: [facility] ORS;  Service: General;  Laterality: Right;   SIMPLE MASTECTOMY WITH AXILLARY SENTINEL NODE BIOPSY Bilateral 11/11/2019   Procedure: SIMPLE MASTECTOMY WITH AXILLARY SENTINEL NODE BIOPSY;  Surgeon: Leafy Ro, MD;  Location: [facility] ORS;  Service: General;  Laterality: Bilateral;    Social History   Socioeconomic History   Marital status: Divorced    Spouse name: Not on file   Number of children: Not on file   Years of education: Not on file   Highest education level: Not on file  Occupational History   Not on file  Tobacco Use   Smoking status: Former    Current packs/day: 0.00    Average packs/day: 0.5 packs/day for 5.0 years (2.5 ttl pk-yrs)    Types: Cigarettes    Start date: 60    Quit date: 51    Years since quitting: 54.8   Smokeless tobacco: Never  Vaping Use   Vaping status: Never Used  Substance and Sexual Activity   Alcohol use: Never   Drug use: Never   Sexual activity: Not Currently  Other Topics Concern   Not on file  Social History Narrative    Not on file   Social Determinants of Health   Financial Resource Strain: Not on file  Food Insecurity: Not on file  Transportation Needs: Not on file  Physical Activity: Not on file  Stress: Not on file  Social Connections: Not on file  Intimate Partner Violence: Not on file    Family History  Problem Relation Age of Onset   Hypertension Mother    Cancer Maternal Grandmother    Leukemia Paternal Grandmother    Cancer Maternal Aunt        unk type   Breast cancer Neg Hx      Current Outpatient Medications:    dorzolamide-timolol (COSOPT) 22.3-6.8 MG/ML ophthalmic solution, Administer 1 drop to both eyes Two (2) times a day., Disp: , Rfl:    hydrochlorothiazide (HYDRODIURIL) 25 MG tablet, Take 25 mg by mouth daily., Disp: , Rfl:    latanoprost (XALATAN) 0.005 % ophthalmic solution, Place 1 drop into both eyes at bedtime. , Disp: , Rfl:    losartan (  COZAAR) 100 MG tablet, Take 100 mg by mouth daily., Disp: , Rfl:    ondansetron (ZOFRAN) 8 MG tablet, Take 1 tablet (8 mg total) by mouth 2 (two) times daily as needed. Start on the third day after chemotherapy., Disp: 30 tablet, Rfl: 1   pantoprazole (PROTONIX) 20 MG tablet, Take 1 tablet (20 mg total) by mouth daily., Disp: 30 tablet, Rfl: 3   timolol (TIMOPTIC) 0.5 % ophthalmic solution, Place 1 drop into both eyes daily. , Disp: , Rfl:    cholecalciferol (VITAMIN D3) 25 MCG (1000 UNIT) tablet, Take 1,000 Units by mouth daily. (Patient not taking: Reported on 02/21/2022), Disp: , Rfl:    DULoxetine (CYMBALTA) 30 MG capsule, Take 2 capsules (60 mg total) by mouth daily., Disp: 180 capsule, Rfl: 2   ibuprofen (ADVIL) 800 MG tablet, Take 1 tablet (800 mg total) by mouth every 8 (eight) hours as needed. (Patient not taking: Reported on 04/22/2020), Disp: 30 tablet, Rfl: 0   loperamide (IMODIUM A-D) 2 MG tablet, Take 2 mg by mouth 4 (four) times daily as needed for diarrhea or loose stools. (Patient not taking: Reported on 04/22/2020), Disp: ,  Rfl:    LORazepam (ATIVAN) 0.5 MG tablet, Take 1 tablet (0.5 mg total) by mouth every 6 (six) hours as needed (Nausea or vomiting). (Patient not taking: Reported on 04/22/2020), Disp: 30 tablet, Rfl: 0   potassium chloride SA (KLOR-CON M) 20 MEQ tablet, Take 1 tablet (20 mEq total) by mouth 2 (two) times daily., Disp: 60 tablet, Rfl: 0   prochlorperazine (COMPAZINE) 10 MG tablet, Take 1 tablet (10 mg total) by mouth every 6 (six) hours as needed (Nausea or vomiting). (Patient not taking: Reported on 04/22/2020), Disp: 30 tablet, Rfl: 1 No current facility-administered medications for this visit.  Facility-Administered Medications Ordered in Other Visits:    sodium chloride flush (NS) 0.9 % injection 10 mL, 10 mL, Intravenous, PRN, Creig Hines, MD, 10 mL at 10/09/19 0815  Physical exam:  Vitals:   02/23/23 1316  BP: (!) 150/85  Pulse: 84  Resp: 20  Temp: 97.6 F (36.4 C)  SpO2: 100%  Weight: 179 lb 11.2 oz (81.5 kg)   Physical Exam Cardiovascular:     Rate and Rhythm: Normal rate and regular rhythm.     Heart sounds: Normal heart sounds.  Pulmonary:     Effort: Pulmonary effort is normal.     Breath sounds: Normal breath sounds.  Abdominal:     General: Bowel sounds are normal.     Palpations: Abdomen is soft.  Skin:    General: Skin is warm and dry.  Neurological:     Mental Status: She is alert and oriented to person, place, and time.    Breast exam is performed in seated and lying down position. Patient is status post bilateral mastectomy without reconstruction. No evidence of any chest wall recurrence. No evidence of bilateral axillary adenopathy       Latest Ref Rng & Units 02/23/2023   12:43 PM  CMP  Glucose 70 - 99 mg/dL 413   BUN 8 - 23 mg/dL 14   Creatinine 2.44 - 1.00 mg/dL 0.10   Sodium 272 - 536 mmol/L 134   Potassium 3.5 - 5.1 mmol/L 3.1   Chloride 98 - 111 mmol/L 102   CO2 22 - 32 mmol/L 25   Calcium 8.9 - 10.3 mg/dL 9.2   Total Protein 6.5 - 8.1 g/dL 7.5    Total Bilirubin <1.2 mg/dL 0.8  Alkaline Phos 38 - 126 U/L 101   AST 15 - 41 U/L 16   ALT 0 - 44 U/L 18       Latest Ref Rng & Units 02/23/2023   12:43 PM  CBC  WBC 4.0 - 10.5 K/uL 5.9   Hemoglobin 12.0 - 15.0 g/dL 09.8   Hematocrit 11.9 - 46.0 % 35.8   Platelets 150 - 400 K/uL 225       Assessment and plan- Patient is a 67 y.o. female clinical prognostic stage Ia invasive mammary carcinoma mcT1 ccN0 cM0 ER/PR negative and HER-2/neu negative.  She is s/p neoadjuvant dose dense AC chemotherapy followed by 12 weekly cycles of carbotaxol chemotherapy. She is here for routine f/u of breast cancer  We are now in our fourth year of surveillance.  Clinically she is doing well with no concerning signs and symptoms of recurrence based on today's exam.  She is not on any endocrine therapy given triple negative disease.  Hypokalemia: Continue oral potassium  Chemo-induced peripheral neuropathy: Continue Cymbalta.  Stable symptoms.  I have renewed her prescription for the same   Visit Diagnosis 1. Encounter for follow-up surveillance of breast cancer   2. Chemotherapy-induced peripheral neuropathy (HCC)   3. Hypokalemia      Dr. Owens Shark, MD, MPH [facility] [phone] 02/24/2023 12:11 PM

## 2023-06-12 ENCOUNTER — Other Ambulatory Visit: Payer: Self-pay | Admitting: Oncology

## 2023-07-28 ENCOUNTER — Encounter: Payer: Self-pay | Admitting: Oncology

## 2023-08-13 ENCOUNTER — Other Ambulatory Visit: Payer: Self-pay | Admitting: Oncology

## 2023-08-20 ENCOUNTER — Encounter: Payer: Self-pay | Admitting: Oncology

## 2023-08-27 ENCOUNTER — Inpatient Hospital Stay (HOSPITAL_BASED_OUTPATIENT_CLINIC_OR_DEPARTMENT_OTHER): Payer: Medicare HMO | Admitting: Oncology

## 2023-08-27 ENCOUNTER — Encounter: Payer: Self-pay | Admitting: Oncology

## 2023-08-27 ENCOUNTER — Inpatient Hospital Stay: Payer: Medicare HMO | Attending: Oncology

## 2023-08-27 VITALS — BP 135/88 | HR 85 | Temp 96.3°F | Resp 18 | Ht 66.0 in | Wt 180.9 lb

## 2023-08-27 DIAGNOSIS — Z9013 Acquired absence of bilateral breasts and nipples: Secondary | ICD-10-CM | POA: Diagnosis not present

## 2023-08-27 DIAGNOSIS — E876 Hypokalemia: Secondary | ICD-10-CM | POA: Insufficient documentation

## 2023-08-27 DIAGNOSIS — Z87891 Personal history of nicotine dependence: Secondary | ICD-10-CM | POA: Insufficient documentation

## 2023-08-27 DIAGNOSIS — Z9221 Personal history of antineoplastic chemotherapy: Secondary | ICD-10-CM | POA: Diagnosis not present

## 2023-08-27 DIAGNOSIS — T451X5A Adverse effect of antineoplastic and immunosuppressive drugs, initial encounter: Secondary | ICD-10-CM | POA: Diagnosis not present

## 2023-08-27 DIAGNOSIS — C50912 Malignant neoplasm of unspecified site of left female breast: Secondary | ICD-10-CM

## 2023-08-27 DIAGNOSIS — G620 Drug-induced polyneuropathy: Secondary | ICD-10-CM

## 2023-08-27 DIAGNOSIS — Z853 Personal history of malignant neoplasm of breast: Secondary | ICD-10-CM

## 2023-08-27 DIAGNOSIS — Z08 Encounter for follow-up examination after completed treatment for malignant neoplasm: Secondary | ICD-10-CM

## 2023-08-27 DIAGNOSIS — Z90710 Acquired absence of both cervix and uterus: Secondary | ICD-10-CM | POA: Diagnosis not present

## 2023-08-27 DIAGNOSIS — Z923 Personal history of irradiation: Secondary | ICD-10-CM | POA: Insufficient documentation

## 2023-08-27 LAB — CBC WITH DIFFERENTIAL (CANCER CENTER ONLY)
Abs Immature Granulocytes: 0.03 10*3/uL (ref 0.00–0.07)
Basophils Absolute: 0 10*3/uL (ref 0.0–0.1)
Basophils Relative: 0 %
Eosinophils Absolute: 0.1 10*3/uL (ref 0.0–0.5)
Eosinophils Relative: 1 %
HCT: 39 % (ref 36.0–46.0)
Hemoglobin: 13 g/dL (ref 12.0–15.0)
Immature Granulocytes: 1 %
Lymphocytes Relative: 44 %
Lymphs Abs: 2.9 10*3/uL (ref 0.7–4.0)
MCH: 28.4 pg (ref 26.0–34.0)
MCHC: 33.3 g/dL (ref 30.0–36.0)
MCV: 85.2 fL (ref 80.0–100.0)
Monocytes Absolute: 0.8 10*3/uL (ref 0.1–1.0)
Monocytes Relative: 13 %
Neutro Abs: 2.7 10*3/uL (ref 1.7–7.7)
Neutrophils Relative %: 41 %
Platelet Count: 268 10*3/uL (ref 150–400)
RBC: 4.58 MIL/uL (ref 3.87–5.11)
RDW: 13.1 % (ref 11.5–15.5)
WBC Count: 6.5 10*3/uL (ref 4.0–10.5)
nRBC: 0 % (ref 0.0–0.2)

## 2023-08-27 LAB — CMP (CANCER CENTER ONLY)
ALT: 28 U/L (ref 0–44)
AST: 23 U/L (ref 15–41)
Albumin: 4.3 g/dL (ref 3.5–5.0)
Alkaline Phosphatase: 122 U/L (ref 38–126)
Anion gap: 10 (ref 5–15)
BUN: 14 mg/dL (ref 8–23)
CO2: 25 mmol/L (ref 22–32)
Calcium: 9.2 mg/dL (ref 8.9–10.3)
Chloride: 101 mmol/L (ref 98–111)
Creatinine: 0.83 mg/dL (ref 0.44–1.00)
GFR, Estimated: >60 mL/min (ref >60)
Glucose, Bld: 91 mg/dL (ref 70–99)
Potassium: 3.6 mmol/L (ref 3.5–5.1)
Sodium: 136 mmol/L (ref 135–145)
Total Bilirubin: 0.9 mg/dL (ref 0.0–1.2)
Total Protein: 8.2 g/dL — ABNORMAL HIGH (ref 6.5–8.1)

## 2023-08-27 MED ORDER — DULOXETINE HCL 30 MG PO CPEP: 60.0000 mg | ORAL_CAPSULE | Freq: Every day | ORAL | 2 refills | Status: AC

## 2023-08-27 NOTE — Progress Notes (Signed)
 Hematology/Oncology Consult note [facility]  Telephone:([phone] Fax:([phone]  Patient Care Team: Zeng, Jianfeng, MD as PCP - General (Internal Medicine) Burnie Cartwright, RN as Registered Nurse Arlette Benders, RN (Inactive) as Registered Nurse Avonne Boettcher, MD as Consulting Physician (Oncology) Alben Alma, MD as Consulting Physician (General Surgery)   Name of the patient: Molly Walters  [id number]  1955/07/02   Date of visit: 08/27/23  Diagnosis-  clinical prognostic stage Ia invasive mammary carcinoma cmT1 ccN0 cM0 ER/PR negative and HER-2/neu negative s/p neoadjuvant chemotherapy and bilateral mastectomy   Chief complaint/ Reason for visit-routine follow-up of breast cancer presently in remission  Heme/Onc history:  patient is a 68 year old African-American female with past medical history significant for hypertension.  She has had prior breast cancer in 2001 s/p lumpectomy followed by adjuvant radiation treatment.  She did not require adjuvant chemotherapy.  She does not remember taking any endocrine therapy as well.More recently patient underwent a bilateral screening mammogram in November 2020 which showed a possible mass in the left breast.  This was followed by a diagnostic mammogram and ultrasound which showed a 1.6 cm mass at the 7 o'clock position in the left breast near the lumpectomy scar.  No left axillary adenopathy.  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    MRI bilateral breast showed:The size of the primary left breast mass appears larger on MRI close to 2.4 x 1.9 x 1.7 cm.  She is also noted to have 2 additional enhancing masses 9 x 6 x 10 mm and 5 x 5 x 5 mm in the left breast with suspicious washout kinetics.  No pathological left axillary adenopathy.  Prominent lymph nodes seen on the right side along with non-mass enhancement spanning 5.3 cm in the AP  dimension.  Both the enhancing masses in the left breast were positive for triple negative breast cancer.  Right sided axillary lymph node was negative for malignancy.  Right breast biopsy showed DCIS    Patient has completed neoadjuvant dose dense AC chemotherapy and 12 weekly cycles of carbotaxol chemotherapy on 10/16/2019.    Patient underwent bilateral mastectomy without reconstruction.  Residual 10 mm of carcinoma was seen in the left breast.  No evidence of malignancy in the right breast.  No evidence of residual in situ carcinoma in the right breast.  One sentinel lymph node on the left and three sentinel lymph nodes on the right were negative for malignancy.  Overall cancer cellularity was 10%.  Adjuvant Xeloda  was offered and patient declined it.    genetic testing negative.   Interval history-she feels well overall.  Denies any changes in her appetite or weight.  Denies any new aches and pains anywhere.  Denies any chest wall concerns.  ECOG PS- 1 Pain scale- 0 Opioid associated constipation- no  Review of systems- Review of Systems  Constitutional:  Negative for chills, fever, malaise/fatigue and weight loss.  HENT:  Negative for congestion, ear discharge and nosebleeds.   Eyes:  Negative for blurred vision.  Respiratory:  Negative for cough, hemoptysis, sputum production, shortness of breath and wheezing.   Cardiovascular:  Negative for chest pain, palpitations, orthopnea and claudication.  Gastrointestinal:  Negative for abdominal pain, blood in stool, constipation, diarrhea, heartburn, melena, nausea and vomiting.  Genitourinary:  Negative for dysuria, flank pain, frequency, hematuria and urgency.  Musculoskeletal:  Negative for back pain, joint pain and  myalgias.  Skin:  Negative for rash.  Neurological:  Negative for dizziness, tingling, focal weakness, seizures, weakness and headaches.  Endo/Heme/Allergies:  Does not bruise/bleed easily.  Psychiatric/Behavioral:  Negative for  depression and suicidal ideas. The patient does not have insomnia.       No Known Allergies   Past Medical History:  Diagnosis Date   Breast cancer (HCC) 2001   left breast lumpectomy and rad tx   Breast cancer (HCC) 02/2019   left breast mass w/ biopsy   Breast cancer (HCC) 2021   right DCIS   Family history of cancer    GERD (gastroesophageal reflux disease)    History of breast cancer    Hypertension    Personal history of radiation therapy 2001   BREAST CA     Past Surgical History:  Procedure Laterality Date   ABDOMINAL HYSTERECTOMY     BREAST BIOPSY Left 2001   POS   BREAST BIOPSY Left 2021   positive   BREAST BIOPSY Right 2021   DCIS   BREAST LUMPECTOMY Left 2001   PORT-A-CATH REMOVAL N/A 11/11/2019   Procedure: REMOVAL PORT-A-CATH;  Surgeon: Alben Alma, MD;  Location: [facility] ORS;  Service: General;  Laterality: N/A;   PORTACATH PLACEMENT Right 05/16/2019   Procedure: INSERTION PORT-A-CATH;  Surgeon: Alben Alma, MD;  Location: [facility] ORS;  Service: General;  Laterality: Right;   SIMPLE MASTECTOMY WITH AXILLARY SENTINEL NODE BIOPSY Bilateral 11/11/2019   Procedure: SIMPLE MASTECTOMY WITH AXILLARY SENTINEL NODE BIOPSY;  Surgeon: Alben Alma, MD;  Location: [facility] ORS;  Service: General;  Laterality: Bilateral;    Social History   Socioeconomic History   Marital status: Divorced    Spouse name: Not on file   Number of children: Not on file   Years of education: Not on file   Highest education level: Not on file  Occupational History   Not on file  Tobacco Use   Smoking status: Former    Current packs/day: 0.00    Average packs/day: 0.5 packs/day for 5.0 years (2.5 ttl pk-yrs)    Types: Cigarettes    Start date: 68    Quit date: 65    Years since quitting: 55.3   Smokeless tobacco: Never  Vaping Use   Vaping status: Never Used  Substance and Sexual Activity   Alcohol use: Never   Drug use: Never   Sexual activity: Not Currently  Other Topics  Concern   Not on file  Social History Narrative   Not on file   Social Drivers of Health   Financial Resource Strain: Not on file  Food Insecurity: Not on file  Transportation Needs: Not on file  Physical Activity: Not on file  Stress: Not on file  Social Connections: Not on file  Intimate Partner Violence: Not on file    Family History  Problem Relation Age of Onset   Hypertension Mother    Cancer Maternal Grandmother    Leukemia Paternal Grandmother    Cancer Maternal Aunt        unk type   Breast cancer Neg Hx      Current Outpatient Medications:    cholecalciferol (VITAMIN D3) 25 MCG (1000 UNIT) tablet, Take 1,000 Units by mouth daily. (Patient not taking: Reported on 02/21/2022), Disp: , Rfl:    dorzolamide-timolol  (COSOPT) 22.3-6.8 MG/ML ophthalmic solution, Administer 1 drop to both eyes Two (2) times a day., Disp: , Rfl:    DULoxetine  (CYMBALTA ) 30 MG capsule, Take 2 capsules (  60 mg total) by mouth daily., Disp: 180 capsule, Rfl: 2   hydrochlorothiazide (HYDRODIURIL) 25 MG tablet, Take 25 mg by mouth daily., Disp: , Rfl:    ibuprofen  (ADVIL ) 800 MG tablet, Take 1 tablet (800 mg total) by mouth every 8 (eight) hours as needed. (Patient not taking: Reported on 04/22/2020), Disp: 30 tablet, Rfl: 0   latanoprost  (XALATAN ) 0.005 % ophthalmic solution, Place 1 drop into both eyes at bedtime. , Disp: , Rfl:    loperamide  (IMODIUM  A-D) 2 MG tablet, Take 2 mg by mouth 4 (four) times daily as needed for diarrhea or loose stools. (Patient not taking: Reported on 04/22/2020), Disp: , Rfl:    LORazepam  (ATIVAN ) 0.5 MG tablet, Take 1 tablet (0.5 mg total) by mouth every 6 (six) hours as needed (Nausea or vomiting). (Patient not taking: Reported on 04/22/2020), Disp: 30 tablet, Rfl: 0   losartan  (COZAAR ) 100 MG tablet, Take 100 mg by mouth daily., Disp: , Rfl:    ondansetron  (ZOFRAN ) 8 MG tablet, Take 1 tablet (8 mg total) by mouth 2 (two) times daily as needed. Start on the third day after  chemotherapy., Disp: 30 tablet, Rfl: 1   pantoprazole  (PROTONIX ) 20 MG tablet, Take 1 tablet (20 mg total) by mouth daily., Disp: 30 tablet, Rfl: 3   potassium chloride  SA (KLOR-CON  M) 20 MEQ tablet, TAKE ONE TABLET BY MOUTH TWICE DAILY, Disp: 60 tablet, Rfl: 0   prochlorperazine  (COMPAZINE ) 10 MG tablet, Take 1 tablet (10 mg total) by mouth every 6 (six) hours as needed (Nausea or vomiting). (Patient not taking: Reported on 04/22/2020), Disp: 30 tablet, Rfl: 1   timolol  (TIMOPTIC ) 0.5 % ophthalmic solution, Place 1 drop into both eyes daily. , Disp: , Rfl:  No current facility-administered medications for this visit.  Facility-Administered Medications Ordered in Other Visits:    sodium chloride  flush (NS) 0.9 % injection 10 mL, 10 mL, Intravenous, PRN, Avonne Boettcher, MD, 10 mL at 10/09/19 0815  Physical exam:  Vitals:   08/27/23 1313  BP: 135/88  Pulse: 85  Resp: 18  Temp: (!) 96.3 F (35.7 C)  TempSrc: Tympanic  SpO2: 100%  Weight: 180 lb 14.4 oz (82.1 kg)  Height: 5\' 6"  (1.676 m)   Physical Exam Cardiovascular:     Rate and Rhythm: Normal rate and regular rhythm.     Heart sounds: Normal heart sounds.  Pulmonary:     Effort: Pulmonary effort is normal.     Breath sounds: Normal breath sounds.  Skin:    General: Skin is warm and dry.  Neurological:     Mental Status: She is alert and oriented to person, place, and time.   Chest wall exam: Patient is s/p bilateral mastectomy without reconstruction.  No evidence of chest wall recurrence.  No palpable bilateral axillary adenopathy.  I have personally reviewed labs listed below:    Latest Ref Rng & Units 08/27/2023   12:56 PM  CMP  Glucose 70 - 99 mg/dL 91   BUN 8 - 23 mg/dL 14   Creatinine 1.61 - 1.00 mg/dL 0.96   Sodium 045 - 409 mmol/L 136   Potassium 3.5 - 5.1 mmol/L 3.6   Chloride 98 - 111 mmol/L 101   CO2 22 - 32 mmol/L 25   Calcium 8.9 - 10.3 mg/dL 9.2   Total Protein 6.5 - 8.1 g/dL 8.2   Total Bilirubin 0.0 -  1.2 mg/dL 0.9   Alkaline Phos 38 - 126 U/L 122   AST  15 - 41 U/L 23   ALT 0 - 44 U/L 28       Latest Ref Rng & Units 08/27/2023   12:56 PM  CBC  WBC 4.0 - 10.5 K/uL 6.5   Hemoglobin 12.0 - 15.0 g/dL 16.1   Hematocrit 09.6 - 46.0 % 39.0   Platelets 150 - 400 K/uL 268      Assessment and plan- Patient is a 68 y.o. female  clinical prognostic stage Ia invasive mammary carcinoma mcT1 ccN0 cM0 ER/PR negative and HER-2/neu negative.  She is s/p neoadjuvant dose dense AC chemotherapy followed by 12 weekly cycles of carbotaxol chemotherapy.  She is presently in remission and here for a routine follow-up visit for her breast cancer  No evidence of chest wall recurrence based on today's exam.  Clinically she reports no suspicious signs and symptoms.  This is here for of surveillance.  I will see her back in 6 months no labs.  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  Prescription refilled today  Hypokalemia: She is on 20 mEq of potassium daily which she will continue   Visit Diagnosis 1. Encounter for follow-up surveillance of breast cancer      Dr. Seretha Dance, MD, MPH [facility] [phone] 08/27/2023 4:17 PM

## 2023-09-18 ENCOUNTER — Other Ambulatory Visit: Payer: Self-pay

## 2023-09-18 ENCOUNTER — Other Ambulatory Visit: Payer: Self-pay | Admitting: Oncology

## 2023-09-18 MED ORDER — POTASSIUM CHLORIDE CRYS ER 20 MEQ PO TBCR: 20.0000 meq | EXTENDED_RELEASE_TABLET | Freq: Two times a day (BID) | ORAL | 0 refills | Status: DC

## 2023-10-23 ENCOUNTER — Other Ambulatory Visit: Payer: Self-pay | Admitting: Oncology

## 2023-10-25 ENCOUNTER — Encounter: Payer: Self-pay | Admitting: Oncology

## 2023-11-09 ENCOUNTER — Other Ambulatory Visit: Payer: Self-pay | Admitting: Physician Assistant

## 2023-11-09 DIAGNOSIS — Z780 Asymptomatic menopausal state: Secondary | ICD-10-CM

## 2023-11-28 ENCOUNTER — Other Ambulatory Visit: Payer: Self-pay | Admitting: Oncology

## 2023-12-31 ENCOUNTER — Other Ambulatory Visit: Payer: Self-pay | Admitting: Oncology

## 2024-01-24 ENCOUNTER — Ambulatory Visit
Admission: RE | Admit: 2024-01-24 | Discharge: 2024-01-24 | Disposition: A | Discharge status: Home / Self Care | Source: Ambulatory Visit | Attending: Physician Assistant | Admitting: Physician Assistant

## 2024-01-24 DIAGNOSIS — Z780 Asymptomatic menopausal state: Secondary | ICD-10-CM | POA: Insufficient documentation

## 2024-01-29 ENCOUNTER — Other Ambulatory Visit: Payer: Self-pay | Admitting: Oncology

## 2024-02-22 ENCOUNTER — Inpatient Hospital Stay: Attending: Oncology | Admitting: Oncology

## 2024-02-22 ENCOUNTER — Inpatient Hospital Stay

## 2024-02-22 ENCOUNTER — Encounter: Payer: Self-pay | Admitting: Oncology

## 2024-02-22 VITALS — BP 139/85 | HR 84 | Temp 98.6°F | Resp 20 | Wt 181.6 lb

## 2024-02-22 DIAGNOSIS — Z08 Encounter for follow-up examination after completed treatment for malignant neoplasm: Secondary | ICD-10-CM

## 2024-02-22 DIAGNOSIS — Z87891 Personal history of nicotine dependence: Secondary | ICD-10-CM | POA: Insufficient documentation

## 2024-02-22 DIAGNOSIS — E876 Hypokalemia: Secondary | ICD-10-CM | POA: Insufficient documentation

## 2024-02-22 DIAGNOSIS — Z923 Personal history of irradiation: Secondary | ICD-10-CM | POA: Insufficient documentation

## 2024-02-22 DIAGNOSIS — Z806 Family history of leukemia: Secondary | ICD-10-CM | POA: Insufficient documentation

## 2024-02-22 DIAGNOSIS — Z9013 Acquired absence of bilateral breasts and nipples: Secondary | ICD-10-CM | POA: Insufficient documentation

## 2024-02-22 DIAGNOSIS — Z9221 Personal history of antineoplastic chemotherapy: Secondary | ICD-10-CM | POA: Insufficient documentation

## 2024-02-22 DIAGNOSIS — G620 Drug-induced polyneuropathy: Secondary | ICD-10-CM | POA: Insufficient documentation

## 2024-02-22 DIAGNOSIS — Z853 Personal history of malignant neoplasm of breast: Secondary | ICD-10-CM | POA: Diagnosis not present

## 2024-02-22 DIAGNOSIS — T451X5A Adverse effect of antineoplastic and immunosuppressive drugs, initial encounter: Secondary | ICD-10-CM | POA: Diagnosis not present

## 2024-02-22 LAB — BASIC METABOLIC PANEL - CANCER CENTER ONLY
Anion gap: 9 (ref 5–15)
BUN: 14 mg/dL (ref 8–23)
CO2: 25 mmol/L (ref 22–32)
Calcium: 9.4 mg/dL (ref 8.9–10.3)
Chloride: 100 mmol/L (ref 98–111)
Creatinine: 0.92 mg/dL (ref 0.44–1.00)
GFR, Estimated: >60 mL/min (ref >60)
Glucose, Bld: 106 mg/dL — ABNORMAL HIGH (ref 70–99)
Potassium: 3.5 mmol/L (ref 3.5–5.1)
Sodium: 134 mmol/L — ABNORMAL LOW (ref 135–145)

## 2024-02-22 NOTE — Progress Notes (Signed)
 Hematology/Oncology Consult note [facility]  Telephone:([phone] [phone]  Patient Care Team: Zeng, Jianfeng, MD as PCP - General (Internal Medicine) Cindie Jesusa HERO, RN as Registered Nurse Dannielle Arlean FALCON, RN (Inactive) as Registered Nurse Melanee Annah BROCKS, MD as Consulting Physician (Oncology) Jordis Laneta FALCON, MD as Consulting Physician (General Surgery)   Name of the patient: Molly Walters  [id number]  08/11/1955   Date of visit: 02/22/24  Diagnosis-history of breast cancer in remission  Chief complaint/ Reason for visit-routine follow-up of breast cancer  Heme/Onc history: patient is a 68 year old African-American female with past medical history significant for hypertension.  She has had prior breast cancer in 2001 s/p lumpectomy followed by adjuvant radiation treatment.  She did not require adjuvant chemotherapy.  She does not remember taking any endocrine therapy as well.More recently patient underwent a bilateral screening mammogram in November 2020 which showed a possible mass in the left breast.  This was followed by a diagnostic mammogram and ultrasound which showed a 1.6 cm mass at the 7 o'clock position in the left breast near the lumpectomy scar.  No left axillary adenopathy.  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    MRI bilateral breast showed:The size of the primary left breast mass appears larger on MRI close to 2.4 x 1.9 x 1.7 cm.  She is also noted to have 2 additional enhancing masses 9 x 6 x 10 mm and 5 x 5 x 5 mm in the left breast with suspicious washout kinetics.  No pathological left axillary adenopathy.  Prominent lymph nodes seen on the right side along with non-mass enhancement spanning 5.3 cm in the AP dimension.  Both the enhancing masses in the left breast were positive for triple negative breast cancer.  Right sided axillary lymph node was negative  for malignancy.  Right breast biopsy showed DCIS    Patient has completed neoadjuvant dose dense AC chemotherapy and 12 weekly cycles of carbotaxol chemotherapy on 10/16/2019.    Patient underwent bilateral mastectomy without reconstruction.  Residual 10 mm of carcinoma was seen in the left breast.  No evidence of malignancy in the right breast.  No evidence of residual in situ carcinoma in the right breast.  One sentinel lymph node on the left and three sentinel lymph nodes on the right were negative for malignancy.  Overall cancer cellularity was 10%.  Adjuvant Xeloda  was offered and patient declined it.    genetic testing negative.   Interval history-she is doing well overall and denies any chest wall concerns.  Neuropathy is overall stable with Cymbalta .  She has also been taking oral potassium consistently.  Perjeta weight.  Denies any new aches and pains anywhere  ECOG PS- 1 Pain scale- 0   Review of systems- Review of Systems  Constitutional:  Negative for chills, fever, malaise/fatigue and weight loss.  HENT:  Negative for congestion, ear discharge and nosebleeds.   Eyes:  Negative for blurred vision.  Respiratory:  Negative for cough, hemoptysis, sputum production, shortness of breath and wheezing.   Cardiovascular:  Negative for chest pain, palpitations, orthopnea and claudication.  Gastrointestinal:  Negative for abdominal pain, blood in stool, constipation, diarrhea, heartburn, melena, nausea and vomiting.  Genitourinary:  Negative for dysuria, flank pain, frequency, hematuria and urgency.  Musculoskeletal:  Negative for back pain, joint pain and myalgias.  Skin:  Negative for rash.  Neurological:  Negative for dizziness, tingling,  focal weakness, seizures, weakness and headaches.  Endo/Heme/Allergies:  Does not bruise/bleed easily.  Psychiatric/Behavioral:  Negative for depression and suicidal ideas. The patient does not have insomnia.       No Known Allergies   Past Medical  History:  Diagnosis Date   Breast cancer (HCC) 2001   left breast lumpectomy and rad tx   Breast cancer (HCC) 02/2019   left breast mass w/ biopsy   Breast cancer (HCC) 2021   right DCIS   Family history of cancer    GERD (gastroesophageal reflux disease)    History of breast cancer    Hypertension    Personal history of radiation therapy 2001   BREAST CA     Past Surgical History:  Procedure Laterality Date   ABDOMINAL HYSTERECTOMY     BREAST BIOPSY Left 2001   POS   BREAST BIOPSY Left 2021   positive   BREAST BIOPSY Right 2021   DCIS   BREAST LUMPECTOMY Left 2001   PORT-A-CATH REMOVAL N/A 11/11/2019   Procedure: REMOVAL PORT-A-CATH;  Surgeon: Jordis Laneta FALCON, MD;  Location: [facility] ORS;  Service: General;  Laterality: N/A;   PORTACATH PLACEMENT Right 05/16/2019   Procedure: INSERTION PORT-A-CATH;  Surgeon: Jordis Laneta FALCON, MD;  Location: [facility] ORS;  Service: General;  Laterality: Right;   SIMPLE MASTECTOMY WITH AXILLARY SENTINEL NODE BIOPSY Bilateral 11/11/2019   Procedure: SIMPLE MASTECTOMY WITH AXILLARY SENTINEL NODE BIOPSY;  Surgeon: Jordis Laneta FALCON, MD;  Location: [facility] ORS;  Service: General;  Laterality: Bilateral;    Social History   Socioeconomic History   Marital status: Divorced    Spouse name: Not on file   Number of children: Not on file   Years of education: Not on file   Highest education level: Not on file  Occupational History   Not on file  Tobacco Use   Smoking status: Former    Current packs/day: 0.00    Average packs/day: 0.5 packs/day for 5.0 years (2.5 ttl pk-yrs)    Types: Cigarettes    Start date: 29    Quit date: 20    Years since quitting: 55.8   Smokeless tobacco: Never  Vaping Use   Vaping status: Never Used  Substance and Sexual Activity   Alcohol use: Never   Drug use: Never   Sexual activity: Not Currently  Other Topics Concern   Not on file  Social History Narrative   Not on file   Social Drivers of Health   Financial  Resource Strain: Not on file  Food Insecurity: Not on file  Transportation Needs: Not on file  Physical Activity: Not on file  Stress: Not on file  Social Connections: Not on file  Intimate Partner Violence: Not on file    Family History  Problem Relation Age of Onset   Hypertension Mother    Cancer Maternal Grandmother    Leukemia Paternal Grandmother    Cancer Maternal Aunt        unk type   Breast cancer Neg Hx      Current Outpatient Medications:    cholecalciferol (VITAMIN D3) 25 MCG (1000 UNIT) tablet, Take 1,000 Units by mouth daily. (Patient not taking: Reported on 02/21/2022), Disp: , Rfl:    dorzolamide-timolol  (COSOPT) 22.3-6.8 MG/ML ophthalmic solution, Administer 1 drop to both eyes Two (2) times a day., Disp: , Rfl:    DULoxetine  (CYMBALTA ) 30 MG capsule, Take 2 capsules (60 mg total) by mouth daily., Disp: 180 capsule, Rfl: 2   hydrochlorothiazide (  HYDRODIURIL) 25 MG tablet, Take 25 mg by mouth daily., Disp: , Rfl:    ibuprofen  (ADVIL ) 800 MG tablet, Take 1 tablet (800 mg total) by mouth every 8 (eight) hours as needed. (Patient not taking: Reported on 04/22/2020), Disp: 30 tablet, Rfl: 0   latanoprost  (XALATAN ) 0.005 % ophthalmic solution, Place 1 drop into both eyes at bedtime. , Disp: , Rfl:    loperamide  (IMODIUM  A-D) 2 MG tablet, Take 2 mg by mouth 4 (four) times daily as needed for diarrhea or loose stools. (Patient not taking: Reported on 04/22/2020), Disp: , Rfl:    LORazepam  (ATIVAN ) 0.5 MG tablet, Take 1 tablet (0.5 mg total) by mouth every 6 (six) hours as needed (Nausea or vomiting). (Patient not taking: Reported on 04/22/2020), Disp: 30 tablet, Rfl: 0   losartan  (COZAAR ) 100 MG tablet, Take 100 mg by mouth daily., Disp: , Rfl:    ondansetron  (ZOFRAN ) 8 MG tablet, Take 1 tablet (8 mg total) by mouth 2 (two) times daily as needed. Start on the third day after chemotherapy., Disp: 30 tablet, Rfl: 1   pantoprazole  (PROTONIX ) 20 MG tablet, Take 1 tablet (20 mg total) by  mouth daily., Disp: 30 tablet, Rfl: 3   potassium chloride  SA (KLOR-CON  M) 20 MEQ tablet, TAKE ONE TABLET BY MOUTH TWICE DAILY, Disp: 60 tablet, Rfl: 0   prochlorperazine  (COMPAZINE ) 10 MG tablet, Take 1 tablet (10 mg total) by mouth every 6 (six) hours as needed (Nausea or vomiting). (Patient not taking: Reported on 04/22/2020), Disp: 30 tablet, Rfl: 1   timolol  (TIMOPTIC ) 0.5 % ophthalmic solution, Place 1 drop into both eyes daily. , Disp: , Rfl:  No current facility-administered medications for this visit.  Facility-Administered Medications Ordered in Other Visits:    sodium chloride  flush (NS) 0.9 % injection 10 mL, 10 mL, Intravenous, PRN, Melanee Annah BROCKS, MD, 10 mL at 10/09/19 0815  Physical exam:  Vitals:   02/22/24 1516  BP: 139/85  Pulse: 84  Resp: 20  Temp: 98.6 F (37 C)  SpO2: 100%  Weight: 181 lb 9.6 oz (82.4 kg)   Physical Exam Cardiovascular:     Rate and Rhythm: Normal rate and regular rhythm.     Heart sounds: Normal heart sounds.  Pulmonary:     Effort: Pulmonary effort is normal.     Breath sounds: Normal breath sounds.  Skin:    General: Skin is warm and dry.  Neurological:     Mental Status: She is alert and oriented to person, place, and time.   Chest wall exam: Patient is status post bilateral mastectomy without reconstruction.  No evidence of chest wall recurrence.  No palpable bilateral axillary adenopathy.  I have personally reviewed labs listed below:    Latest Ref Rng & Units 08/27/2023   12:56 PM  CMP  Glucose 70 - 99 mg/dL 91   BUN 8 - 23 mg/dL 14   Creatinine 9.55 - 1.00 mg/dL 9.16   Sodium 864 - 854 mmol/L 136   Potassium 3.5 - 5.1 mmol/L 3.6   Chloride 98 - 111 mmol/L 101   CO2 22 - 32 mmol/L 25   Calcium 8.9 - 10.3 mg/dL 9.2   Total Protein 6.5 - 8.1 g/dL 8.2   Total Bilirubin 0.0 - 1.2 mg/dL 0.9   Alkaline Phos 38 - 126 U/L 122   AST 15 - 41 U/L 23   ALT 0 - 44 U/L 28       Latest Ref Rng & Units  08/27/2023   12:56 PM  CBC  WBC  4.0 - 10.5 K/uL 6.5   Hemoglobin 12.0 - 15.0 g/dL 86.9   Hematocrit 63.9 - 46.0 % 39.0   Platelets 150 - 400 K/uL 268    I have personally reviewed Radiology images listed below: No images are attached to the encounter.  DG BONE DENSITY (DXA) Result Date: 01/24/2024 EXAM: DUAL X-RAY ABSORPTIOMETRY (DXA) FOR BONE MINERAL DENSITY 01/24/2024 10:38 am CLINICAL DATA:  68 year old Female Postmenopausal. PATIENT DUE FOR DEXA SCAN TECHNIQUE: An axial (e.g., hips, spine) and/or appendicular (e.g., radius) exam was performed, as appropriate, using GE Secretary/administrator at [facility]. Images are obtained for bone mineral density measurement and are not obtained for diagnostic purposes. [id number] Exclusions: L3-L4. COMPARISON:  None. FINDINGS: Scan quality: Good. LUMBAR SPINE (L1-L2): BMD (in g/cm2): 1.258 T-score: 0.8 Z-score: 1.7 LEFT FEMORAL NECK: BMD (in g/cm2): 0.898 T-score: -1.0 Z-score: -0.4 LEFT TOTAL HIP: BMD (in g/cm2): 1.005 T-score: 0.0 Z-score: 0.3 RIGHT FEMORAL NECK: BMD (in g/cm2): 0.973 T-score: -0.5 Z-score: 0.2 RIGHT TOTAL HIP: BMD (in g/cm2): 0.992 T-score: -0.1 Z-score: 0.2 FRAX 10-YEAR PROBABILITY OF FRACTURE: FRAX not reported as the lowest BMD is not in the osteopenia range. IMPRESSION: Normal based on BMD. Fracture risk is not increased. RECOMMENDATIONS: 1. All patients should optimize calcium and vitamin D intake. 2. Consider FDA-approved medical therapies in postmenopausal women and men aged 73 years and older, based on the following: - A hip or vertebral (clinical or morphometric) fracture - T-score less than or equal to -2.5 and secondary causes have been excluded. - Low bone mass (T-score between -1.0 and -2.5) and a 10-year probability of a hip fracture greater than or equal to 3% or a 10-year probability of a major osteoporosis-related fracture greater than or equal to 20% based on the [place] -adapted WHO algorithm. - Clinician judgment and/or patient  preferences may indicate treatment for people with 10-year fracture probabilities above or below these levels 3. Patients with diagnosis of osteoporosis or at high risk for fracture should have regular bone mineral density tests. For patients eligible for Medicare, routine testing is allowed once every 2 years. The testing frequency can be increased to one year for patients who have rapidly progressing disease, those who are receiving or discontinuing medical therapy to restore bone mass, or have additional risk factors. Electronically Signed   By: Harrietta Sherry M.D.   On: 01/24/2024 10:58     Assessment and plan- Patient is a 68 y.o. female here for a routine surveillance visit for breast cancer  Clinically patient is doing well with no concerning signs she has undergone surgery for triple negative breast cancer after undergoing neoadjuvant chemotherapy back in July 2021 and this is year 4 of surveillance.  She has undergone bilateral mastectomy without reconstruction and therefore does not require routine surveillance mammogram I will see her in 6 months no labs.  Patient has chronic chemo-induced peripheral neuropathy for which she will continue to monitor.  Chronic hypokalemia: Will recheck her BMP today since we have been giving her oral potassium.  I have encouraged the patient to speak to her primary care doctor for future potassium refills   Visit Diagnosis 1. Encounter for follow-up surveillance of breast cancer      Dr. Annah Skene, MD, MPH [facility] [phone] 02/22/2024 3:32 PM

## 2024-02-25 ENCOUNTER — Ambulatory Visit: Admitting: Oncology

## 2024-03-04 ENCOUNTER — Other Ambulatory Visit: Payer: Self-pay | Admitting: Oncology

## 2024-03-04 NOTE — Telephone Encounter (Signed)
 Per Dr. Melanee Pt should reach out to pcp for continued refills since I do not see her frequently.  Outbound call to patient; informed of above. Patient stated she doesn't go back to see them until 06/16/23.  Asked patient if she's able to send them a my chart or give them a call and she agreed she would. Informed if she has trouble getting in contact with them to give us  a call at the center; patient verbalized understanding.

## 2024-03-04 NOTE — Telephone Encounter (Signed)
 Pt should reach out to pcp for continued refills since I do not see her frequently

## 2024-03-07 ENCOUNTER — Other Ambulatory Visit: Payer: Self-pay

## 2024-03-07 ENCOUNTER — Telehealth: Payer: Self-pay

## 2024-03-07 DIAGNOSIS — Z1211 Encounter for screening for malignant neoplasm of colon: Secondary | ICD-10-CM

## 2024-03-07 MED ORDER — NA SULFATE-K SULFATE-MG SULF 17.5-3.13-1.6 GM/177ML PO SOLN: 1.0000 | Freq: Once | ORAL | 0 refills | Status: AC

## 2024-03-07 NOTE — Telephone Encounter (Signed)
 Gastroenterology Pre-Procedure Review  Request Date: 06/20/24 Requesting Physician: Dr. Melany  PATIENT REVIEW QUESTIONS: The patient responded to the following health history questions as indicated:    1. Are you having any GI issues? no 2. 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 yes (no record of last colonoscopy) 3. Do you have a family history of Colon Cancer or Polyps? no 4. Diabetes Mellitus? no 5. Joint replacements in the past 12 months?no 6. Major health problems in the past 3 months?no 7. Any artificial heart valves, MVP, or defibrillator?no    MEDICATIONS & ALLERGIES:    Patient reports the following regarding taking any anticoagulation/antiplatelet therapy:   Plavix, Coumadin, Eliquis, Xarelto, Lovenox , Pradaxa, Brilinta, or Effient? no Aspirin? no  Patient confirms/reports the following medications:  Current Outpatient Medications  Medication Sig Dispense Refill   cholecalciferol (VITAMIN D3) 25 MCG (1000 UNIT) tablet Take 1,000 Units by mouth daily. (Patient not taking: Reported on 02/22/2024)     dorzolamide-timolol  (COSOPT) 22.3-6.8 MG/ML ophthalmic solution Administer 1 drop to both eyes Two (2) times a day.     DULoxetine  (CYMBALTA ) 30 MG capsule Take 2 capsules (60 mg total) by mouth daily. 180 capsule 2   hydrochlorothiazide (HYDRODIURIL) 25 MG tablet Take 25 mg by mouth daily.     ibuprofen  (ADVIL ) 800 MG tablet Take 1 tablet (800 mg total) by mouth every 8 (eight) hours as needed. (Patient not taking: Reported on 02/22/2024) 30 tablet 0   latanoprost  (XALATAN ) 0.005 % ophthalmic solution Place 1 drop into both eyes at bedtime.      loperamide  (IMODIUM  A-D) 2 MG tablet Take 2 mg by mouth 4 (four) times daily as needed for diarrhea or loose stools. (Patient not taking: Reported on 02/22/2024)     LORazepam  (ATIVAN ) 0.5 MG tablet Take 1 tablet (0.5 mg total) by mouth every 6 (six) hours as needed (Nausea or vomiting). (Patient not taking: Reported on 02/22/2024) 30  tablet 0   losartan  (COZAAR ) 100 MG tablet Take 100 mg by mouth daily.     ondansetron  (ZOFRAN ) 8 MG tablet Take 1 tablet (8 mg total) by mouth 2 (two) times daily as needed. Start on the third day after chemotherapy. (Patient not taking: Reported on 02/22/2024) 30 tablet 1   pantoprazole  (PROTONIX ) 20 MG tablet Take 1 tablet (20 mg total) by mouth daily. (Patient not taking: Reported on 02/22/2024) 30 tablet 3   potassium chloride  SA (KLOR-CON  M) 20 MEQ tablet TAKE ONE TABLET BY MOUTH TWICE DAILY 60 tablet 0   prochlorperazine  (COMPAZINE ) 10 MG tablet Take 1 tablet (10 mg total) by mouth every 6 (six) hours as needed (Nausea or vomiting). (Patient not taking: Reported on 02/22/2024) 30 tablet 1   timolol  (TIMOPTIC ) 0.5 % ophthalmic solution Place 1 drop into both eyes daily.      No current facility-administered medications for this visit.   Facility-Administered Medications Ordered in Other Visits  Medication Dose Route Frequency Provider Last Rate Last Admin   sodium chloride  flush (NS) 0.9 % injection 10 mL  10 mL Intravenous PRN Rao, Archana C, MD   10 mL at 10/09/19 0815    Patient confirms/reports the following allergies:  No Known Allergies  No orders of the defined types were placed in this encounter.   AUTHORIZATION INFORMATION Primary Insurance: 1D#: Group #:  Secondary Insurance: 1D#: Group #:  SCHEDULE INFORMATION: Date: 06/20/24 Time: Location: [facility]

## 2024-05-23 ENCOUNTER — Encounter: Payer: Self-pay | Admitting: Oncology

## 2024-06-20 ENCOUNTER — Ambulatory Visit: Admit: 2024-06-20 | Admitting: Gastroenterology

## 2024-06-20 SURGERY — COLONOSCOPY
Anesthesia: Choice

## 2024-08-22 ENCOUNTER — Inpatient Hospital Stay: Admitting: Oncology

## 2024-08-22 ENCOUNTER — Inpatient Hospital Stay
